# Patient Record
Sex: Male | Born: 1935 | Race: White | Hispanic: No | Marital: Married | Smoking: Never smoker
Health system: Southern US, Community
[De-identification: ages and names within clinical notes are randomized; demographics above are authoritative.]

## PROBLEM LIST (undated history)

## (undated) DIAGNOSIS — L570 Actinic keratosis: Secondary | ICD-10-CM

## (undated) DIAGNOSIS — J449 Chronic obstructive pulmonary disease, unspecified: Secondary | ICD-10-CM

## (undated) DIAGNOSIS — I10 Essential (primary) hypertension: Secondary | ICD-10-CM

## (undated) DIAGNOSIS — Z950 Presence of cardiac pacemaker: Secondary | ICD-10-CM

## (undated) DIAGNOSIS — I219 Acute myocardial infarction, unspecified: Secondary | ICD-10-CM

## (undated) DIAGNOSIS — Z8739 Personal history of other diseases of the musculoskeletal system and connective tissue: Secondary | ICD-10-CM

## (undated) DIAGNOSIS — H9190 Unspecified hearing loss, unspecified ear: Secondary | ICD-10-CM

## (undated) DIAGNOSIS — I2510 Atherosclerotic heart disease of native coronary artery without angina pectoris: Secondary | ICD-10-CM

## (undated) DIAGNOSIS — E785 Hyperlipidemia, unspecified: Secondary | ICD-10-CM

## (undated) DIAGNOSIS — N400 Enlarged prostate without lower urinary tract symptoms: Secondary | ICD-10-CM

## (undated) DIAGNOSIS — F419 Anxiety disorder, unspecified: Secondary | ICD-10-CM

## (undated) DIAGNOSIS — I499 Cardiac arrhythmia, unspecified: Secondary | ICD-10-CM

## (undated) DIAGNOSIS — G4730 Sleep apnea, unspecified: Secondary | ICD-10-CM

## (undated) DIAGNOSIS — E039 Hypothyroidism, unspecified: Secondary | ICD-10-CM

## (undated) DIAGNOSIS — K219 Gastro-esophageal reflux disease without esophagitis: Secondary | ICD-10-CM

## (undated) DIAGNOSIS — J479 Bronchiectasis, uncomplicated: Secondary | ICD-10-CM

## (undated) DIAGNOSIS — J45909 Unspecified asthma, uncomplicated: Secondary | ICD-10-CM

## (undated) HISTORY — PX: APPENDECTOMY: SHX54

## (undated) HISTORY — PX: CORONARY ANGIOPLASTY: SHX604

## (undated) HISTORY — PX: INSERT / REPLACE / REMOVE PACEMAKER: SUR710

## (undated) HISTORY — DX: Actinic keratosis: L57.0

---

## 1983-02-25 DIAGNOSIS — I219 Acute myocardial infarction, unspecified: Secondary | ICD-10-CM

## 1983-02-25 HISTORY — DX: Acute myocardial infarction, unspecified: I21.9

## 1983-02-25 HISTORY — PX: CORONARY ARTERY BYPASS GRAFT: SHX141

## 2005-01-14 ENCOUNTER — Other Ambulatory Visit: Payer: Self-pay

## 2005-01-14 ENCOUNTER — Ambulatory Visit: Payer: Self-pay | Admitting: Urology

## 2005-01-22 ENCOUNTER — Ambulatory Visit: Payer: Self-pay | Admitting: Urology

## 2007-12-09 ENCOUNTER — Ambulatory Visit: Payer: Self-pay | Admitting: Unknown Physician Specialty

## 2012-02-02 ENCOUNTER — Ambulatory Visit: Payer: Self-pay | Admitting: Unknown Physician Specialty

## 2012-02-23 DIAGNOSIS — R351 Nocturia: Secondary | ICD-10-CM | POA: Insufficient documentation

## 2012-02-23 DIAGNOSIS — N401 Enlarged prostate with lower urinary tract symptoms: Secondary | ICD-10-CM | POA: Insufficient documentation

## 2013-01-27 DIAGNOSIS — C4492 Squamous cell carcinoma of skin, unspecified: Secondary | ICD-10-CM

## 2013-01-27 HISTORY — DX: Squamous cell carcinoma of skin, unspecified: C44.92

## 2013-04-27 DIAGNOSIS — C44622 Squamous cell carcinoma of skin of right upper limb, including shoulder: Secondary | ICD-10-CM

## 2013-04-27 HISTORY — DX: Squamous cell carcinoma of skin of right upper limb, including shoulder: C44.622

## 2013-08-11 DIAGNOSIS — G4730 Sleep apnea, unspecified: Secondary | ICD-10-CM | POA: Insufficient documentation

## 2013-08-11 DIAGNOSIS — J45909 Unspecified asthma, uncomplicated: Secondary | ICD-10-CM | POA: Insufficient documentation

## 2014-03-07 DIAGNOSIS — I2510 Atherosclerotic heart disease of native coronary artery without angina pectoris: Secondary | ICD-10-CM | POA: Insufficient documentation

## 2014-03-07 DIAGNOSIS — G4730 Sleep apnea, unspecified: Secondary | ICD-10-CM

## 2014-03-07 DIAGNOSIS — E785 Hyperlipidemia, unspecified: Secondary | ICD-10-CM | POA: Insufficient documentation

## 2014-03-07 DIAGNOSIS — G4710 Hypersomnia, unspecified: Secondary | ICD-10-CM | POA: Insufficient documentation

## 2014-03-07 DIAGNOSIS — N400 Enlarged prostate without lower urinary tract symptoms: Secondary | ICD-10-CM | POA: Insufficient documentation

## 2014-03-07 DIAGNOSIS — I25810 Atherosclerosis of coronary artery bypass graft(s) without angina pectoris: Secondary | ICD-10-CM | POA: Insufficient documentation

## 2014-03-07 DIAGNOSIS — I10 Essential (primary) hypertension: Secondary | ICD-10-CM | POA: Insufficient documentation

## 2014-03-07 DIAGNOSIS — J449 Chronic obstructive pulmonary disease, unspecified: Secondary | ICD-10-CM | POA: Insufficient documentation

## 2014-06-01 DIAGNOSIS — C44622 Squamous cell carcinoma of skin of right upper limb, including shoulder: Secondary | ICD-10-CM

## 2014-06-01 HISTORY — DX: Squamous cell carcinoma of skin of right upper limb, including shoulder: C44.622

## 2014-06-06 DIAGNOSIS — E782 Mixed hyperlipidemia: Secondary | ICD-10-CM | POA: Insufficient documentation

## 2015-01-16 DIAGNOSIS — C44722 Squamous cell carcinoma of skin of right lower limb, including hip: Secondary | ICD-10-CM

## 2015-01-16 DIAGNOSIS — C44629 Squamous cell carcinoma of skin of left upper limb, including shoulder: Secondary | ICD-10-CM

## 2015-01-16 HISTORY — DX: Squamous cell carcinoma of skin of right lower limb, including hip: C44.722

## 2015-01-16 HISTORY — DX: Squamous cell carcinoma of skin of left upper limb, including shoulder: C44.629

## 2015-02-25 DIAGNOSIS — N400 Enlarged prostate without lower urinary tract symptoms: Secondary | ICD-10-CM

## 2015-02-25 HISTORY — DX: Benign prostatic hyperplasia without lower urinary tract symptoms: N40.0

## 2015-04-16 DIAGNOSIS — I493 Ventricular premature depolarization: Secondary | ICD-10-CM | POA: Insufficient documentation

## 2015-04-19 ENCOUNTER — Ambulatory Visit
Admission: RE | Admit: 2015-04-19 | Discharge: 2015-04-19 | Disposition: A | Discharge status: Home / Self Care | Payer: Medicare Other | Source: Ambulatory Visit | Attending: Internal Medicine | Admitting: Internal Medicine

## 2015-04-19 ENCOUNTER — Other Ambulatory Visit: Payer: Self-pay | Admitting: Internal Medicine

## 2015-04-19 DIAGNOSIS — R0609 Other forms of dyspnea: Secondary | ICD-10-CM | POA: Diagnosis present

## 2015-04-19 DIAGNOSIS — R918 Other nonspecific abnormal finding of lung field: Secondary | ICD-10-CM | POA: Insufficient documentation

## 2015-04-19 DIAGNOSIS — R609 Edema, unspecified: Secondary | ICD-10-CM | POA: Insufficient documentation

## 2015-04-19 DIAGNOSIS — I2510 Atherosclerotic heart disease of native coronary artery without angina pectoris: Secondary | ICD-10-CM | POA: Diagnosis not present

## 2015-04-19 DIAGNOSIS — Z951 Presence of aortocoronary bypass graft: Secondary | ICD-10-CM | POA: Diagnosis not present

## 2015-04-19 LAB — POCT I-STAT CREATININE: CREATININE: 1.3 mg/dL — AB (ref 0.61–1.24)

## 2015-04-19 MED ORDER — IOHEXOL 350 MG/ML SOLN
75.0000 mL | Freq: Once | INTRAVENOUS | Status: AC | PRN
  Administered 2015-04-19: 75 mL via INTRAVENOUS

## 2015-06-07 DIAGNOSIS — J479 Bronchiectasis, uncomplicated: Secondary | ICD-10-CM | POA: Insufficient documentation

## 2015-11-12 ENCOUNTER — Other Ambulatory Visit: Payer: Self-pay

## 2015-11-12 ENCOUNTER — Encounter
Admission: RE | Admit: 2015-11-12 | Discharge: 2015-11-12 | Disposition: A | Discharge status: Home / Self Care | Payer: Medicare Other | Source: Ambulatory Visit | Attending: Surgery | Admitting: Surgery

## 2015-11-12 DIAGNOSIS — Z951 Presence of aortocoronary bypass graft: Secondary | ICD-10-CM | POA: Insufficient documentation

## 2015-11-12 DIAGNOSIS — I10 Essential (primary) hypertension: Secondary | ICD-10-CM | POA: Diagnosis not present

## 2015-11-12 HISTORY — DX: Gastro-esophageal reflux disease without esophagitis: K21.9

## 2015-11-12 HISTORY — DX: Sleep apnea, unspecified: G47.30

## 2015-11-12 HISTORY — DX: Benign prostatic hyperplasia without lower urinary tract symptoms: N40.0

## 2015-11-12 HISTORY — DX: Atherosclerotic heart disease of native coronary artery without angina pectoris: I25.10

## 2015-11-12 HISTORY — DX: Cardiac arrhythmia, unspecified: I49.9

## 2015-11-12 HISTORY — DX: Essential (primary) hypertension: I10

## 2015-11-12 HISTORY — DX: Hyperlipidemia, unspecified: E78.5

## 2015-11-12 HISTORY — DX: Unspecified asthma, uncomplicated: J45.909

## 2015-11-12 HISTORY — DX: Bronchiectasis, uncomplicated: J47.9

## 2015-11-12 HISTORY — DX: Acute myocardial infarction, unspecified: I21.9

## 2015-11-12 HISTORY — DX: Chronic obstructive pulmonary disease, unspecified: J44.9

## 2015-11-12 LAB — CBC WITH DIFFERENTIAL/PLATELET
Basophils Absolute: 0 10*3/uL (ref 0–0.1)
Basophils Relative: 0 %
Eosinophils Absolute: 0.4 10*3/uL (ref 0–0.7)
Eosinophils Relative: 4 %
HCT: 49.8 % (ref 40.0–52.0)
Hemoglobin: 16.6 g/dL (ref 13.0–18.0)
Lymphocytes Relative: 29 %
Lymphs Abs: 2.8 10*3/uL (ref 1.0–3.6)
MCH: 30.1 pg (ref 26.0–34.0)
MCHC: 33.2 g/dL (ref 32.0–36.0)
MCV: 90.4 fL (ref 80.0–100.0)
Monocytes Absolute: 0.7 10*3/uL (ref 0.2–1.0)
Monocytes Relative: 7 %
Neutro Abs: 5.8 10*3/uL (ref 1.4–6.5)
Neutrophils Relative %: 60 %
Platelets: 180 10*3/uL (ref 150–440)
RBC: 5.51 MIL/uL (ref 4.40–5.90)
RDW: 14.1 % (ref 11.5–14.5)
WBC: 9.7 10*3/uL (ref 3.8–10.6)

## 2015-11-12 LAB — BASIC METABOLIC PANEL
ANION GAP: 8 (ref 5–15)
BUN: 17 mg/dL (ref 6–20)
CALCIUM: 9.8 mg/dL (ref 8.9–10.3)
CHLORIDE: 105 mmol/L (ref 101–111)
CO2: 29 mmol/L (ref 22–32)
Creatinine, Ser: 1.07 mg/dL (ref 0.61–1.24)
GFR calc non Af Amer: >60 mL/min (ref >60)
Glucose, Bld: 102 mg/dL — ABNORMAL HIGH (ref 65–99)
Potassium: 4.1 mmol/L (ref 3.5–5.1)
Sodium: 142 mmol/L (ref 135–145)

## 2015-11-12 NOTE — Patient Instructions (Signed)
Your procedure is scheduled on: November 21, 2015 Su procedimiento est programado para: Report to  [facility] a: To find out your arrival time please call [phone] between 1PM - 3PM on November 20, 2015 Para saber su hora de llegada por favor llame al ([place]  Remember: Instructions that are not followed completely may result in serious medical risk, up to and including death, or upon the discretion of your surgeon and anesthesiologist your surgery may need to be rescheduled.  Recuerde: Las instrucciones que no se siguen completamente Heritage manager en un riesgo de salud grave, incluyendo hasta la [place] o a discrecin de su cirujano y Environmental health practitioner, su ciruga se puede posponer.   _x___ 1. Do not eat food or drink liquids after midnight. No gum chewing or hard candies.  No coma alimentos ni tome lquidos despus de la medianoche.  No mastique chicle ni caramelos  duros.     __x_ 2. No alcohol for 24 hours before or after surgery.    No tome alcohol durante las 24 horas antes ni despus de la [place].   ____ 3. Bring all medications with you on the day of surgery if instructed.    Lleve todos los medicamentos con usted el da de su ciruga si se le ha indicado as.   _x__ 4. Notify your doctor if there is any change in your medical condition (cold, fever,                             infections).    Informe a su mdico si hay algn cambio en su condicin mdica (resfriado, fiebre, infecciones).   Do not wear jewelry, make-up, hairpins, clips or nail polish.  No use joyas, maquillajes, pinzas/ganchos para el cabello ni esmalte de uas.  Do not wear lotions, powders, or perfumes. You may wear deodorant.  No use lociones, polvos o perfumes.  Puede usar desodorante.    Do not shave 48 hours prior to surgery. Men may shave face and neck.  No se afeite 48 horas antes de la [place].  Los hombres pueden  [employer] cara y el cuello.   Do not bring valuables to the hospital.   No lleve objetos [facility] is not responsible for any belongings or valuables.  [facility] no se hace responsable de ningn tipo de pertenencias u objetos de Geographical information systems officer.               Contacts, dentures or bridgework may not be worn into surgery.  Los lentes de [place], las dentaduras postizas o puentes no se pueden usar en la [place].  Leave your suitcase in the car. After surgery it may be brought to your room.  Deje su maleta en el auto.  Despus de la ciruga podr traerla a su habitacin.  For patients admitted to the hospital, discharge time is determined by your treatment team.  Para los pacientes que sean ingresados al hospital, el tiempo en el cual se le dar de alta es determinado por su                equipo de [place].   Patients discharged the day of surgery will not be allowed to drive home. A los pacientes que se les da de alta el mismo da de la ciruga no se les permitir conducir a Holiday representative.  Please read over the following fact sheets that you were given: Por favor [place] informacin que le dieron:      ___X_ Take these medicines the morning of surgery with A SIP OF WATER:          [employer] medicinas la maana de la ciruga con UN SORBO DE AGUA:  1.  Amlodipine              2.  Atorvastatin  3.  Pulmicort inhaler  4.  Isosorbide     5.  Singulair and nasal spray  6.  Prilosec ____ Fleet Enema (as directed)          Enema de Fleet (segn lo indicado)    __X__ Use CHG Soap as directed          Utilice el jabn de CHG segn lo indicado  __X__ Use inhalers on the day of surgery and [place]          Use los inhaladores el da de la ciruga  ____ Stop metformin 2 days prior to surgery          Deje de tomar el metformin 2 das antes de la ciruga    ____ Take 1/2 of usual insulin dose the night before  surgery and none on the morning of surgery           Tome la mitad de la dosis habitual de insulina la noche antes de la [place] y no tome nada en la maana de la             ciruga  _ X___ Stop Coumadin/Plavix/aspirin on 11/14/15          Deje de tomar el Coumadin/Plavix/aspirina el da:  __X__ Stop Anti-inflammatories on 11/14/15   ... SUCH AS IBUPROFEN, MOTRIN, ALEVE          Deje de tomar antiinflamatorios el da:   __X__ Stop supplements until after surgery            Deje de tomar suplementos hasta despus de la ciruga  ____ Bring C-Pap to the hospital          [facility]

## 2015-11-12 NOTE — Pre-Procedure Instructions (Signed)
Formatting of this note may be different from the original.  Follow-up  History of Present Illness: Joshua Frye is a 80 y.o. male presents to clinic for recheck. His asthma is stable, he has not used his albuterol in months. He does not smoke. He is wearing his cpap nightly, continued positive response. No chest pain, leg swelling, wheezing, cough, ectopy or syncope.   Current Medications:  Current Outpatient Prescriptions  Medication Sig Dispense Refill  . albuterol 90 mcg/actuation inhaler Inhale 2 inhalations into the lungs every 6 (six) hours as needed.  [name] amLODIPine (NORVASC) 10 MG tablet Take 1 tablet (10 mg total) by mouth once daily. 90 tablet 3  . ascorbic acid (VITAMIN C) 1000 MG tablet Take 1,000 mg by mouth.  [name] aspirin (ASPIRIN LOW DOSE) 81 MG EC tablet Take 81 mg by mouth once daily.  [name] atorvastatin (LIPITOR) 20 MG tablet Take 1 tablet (20 mg total) by mouth once daily. 90 tablet 3  . budesonide (PULMICORT) 180 mcg/actuation inhaler Inhale 1 inhalation into the lungs once daily.  . fluticasone (FLONASE) 50 mcg/actuation nasal spray Place 2 sprays into both nostrils once daily. 48 g 3  . isosorbide mononitrate (IMDUR) 30 MG ER tablet Take 1 tablet (30 mg total) by mouth once daily. 90 tablet 3  . montelukast (SINGULAIR) 10 mg tablet Take 10 mg by mouth nightly.  . multivitamin tablet Take 1 tablet by mouth once daily.  . nitroGLYcerin (NITROSTAT) 0.4 MG SL tablet 1 tablet under tongue as needed [1 tab under tongue every 5 mins for chest pain, if 3rd tab needed take then call 911.] 25 tablet 2  . omeprazole 20 mg Take 1 tablet by mouth once daily. 90 each 3  . tamsulosin (FLOMAX) 0.4 mg capsule Take 0.4 mg by mouth.   No current facility-administered medications for this visit.   Problem List:  Patient Active Problem List  Diagnosis  . Asthma  . Sleep apnea with use of continuous positive airway pressure (CPAP)  . Essential hypertension, benign  . BPH (benign prostatic  hyperplasia)  . Coronary atherosclerosis of autologous vein bypass graft  . Coronary atherosclerosis of native coronary artery  . Hyperlipidemia, mixed   History: Past Medical History  Diagnosis Date  . BPH (benign prostatic hyperplasia)  . Chronic obstructive asthma, unspecified  . 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 Cardiac cath 08/22/03.  . Coronary atherosclerosis of native coronary artery  . Essential hypertension, benign  . Other and unspecified hyperlipidemia  . Sleep apnea  on CPAP   Past Surgical History  Procedure Laterality Date  . Cardiac catheterization 08/22/03  . Appendectomy  . Other surgery 1999  Hernia repair by Dr. Pat Patrick  . Coronary artery bypass graft 1987  x 3 vessels   Family History  Problem Relation Age of Onset  . Throat cancer Father  deceased age 68, throat cancer  . Heart disease Mother   Social History   Social History  . Marital status: Married  Spouse name: N/A  . Number of children: N/A  . Years of education: N/A   Social History Main Topics  . Smoking status: Never Smoker  . Smokeless tobacco: Never Used  Comment: No smoking  . Alcohol use No  . Drug use: No  . Sexual activity: Defer   Other Topics Concern  . None   Social History Narrative   Allergies:  Procardia [nifedipine]  Review of Systems: As per above. Pretty much  unchanged with the exception that his breathing is stable. No other associated cardiopulmonary, GI, GU, dermatological symptoms today. No focal neurological symptoms or psychological changes. The rest of the ros is un changed.   Physical Exam: Visit Vitals  . BP 142/73  . Pulse 62  . Temp 36.6 C (97.9 F) (Oral)  . Wt 86.6 kg (191 lb)  . SpO2 96%  . BMI 28.21 kg/m2  86.6 kg (191 lb) 96% General: NAD. Able to speak in complete sentences without cough or dyspnea HEENT: Normocephalic, nontraumatic. Extraocular movements intact NECK: Supple. No JVD, nodes,  thyromegaly CV: RRR no murmurs, gallops, rubs PULM: Normal respiratory effort, Clear to auscultation bilaterally without wheezing or crackles EXTREMITIES: No significant edema, cyanosis or [place]'signs SKIN: Fair turgor. No rashes LYMPHATIC: No nodes NEURO: No gross deficits PSYCH: Appropriate affect, alert, oriented   Impression:  Asthma, mild intermittent, stable   .  Sleep apnea with use of continuous positive airway pressure (CPAP)      Plan: -continue the above medications -continue cpap on same settings -follow up in 7-8 months

## 2015-11-14 NOTE — Pre-Procedure Instructions (Signed)
FAXED EKG TO DR Ubaldo Glassing TO REVIEW. HE SAW PATIENT 10/15/15. HAD STRESS/ECHO 1/17

## 2015-11-19 NOTE — Pre-Procedure Instructions (Signed)
CLEARED BY DR Ubaldo Glassing 11/16/15

## 2015-11-20 ENCOUNTER — Ambulatory Visit: Payer: Medicare Other | Admitting: Anesthesiology

## 2015-11-20 ENCOUNTER — Ambulatory Visit
Admission: RE | Admit: 2015-11-20 | Discharge: 2015-11-20 | Disposition: A | Discharge status: Home / Self Care | Payer: Medicare Other | Source: Ambulatory Visit | Attending: Surgery | Admitting: Surgery

## 2015-11-20 ENCOUNTER — Encounter
Admission: RE | Disposition: A | Discharge status: Home / Self Care | Payer: Self-pay | Source: Ambulatory Visit | Attending: Surgery

## 2015-11-20 ENCOUNTER — Telehealth: Payer: Self-pay | Admitting: General Surgery

## 2015-11-20 ENCOUNTER — Encounter: Payer: Self-pay | Admitting: *Deleted

## 2015-11-20 DIAGNOSIS — I10 Essential (primary) hypertension: Secondary | ICD-10-CM | POA: Diagnosis not present

## 2015-11-20 DIAGNOSIS — E785 Hyperlipidemia, unspecified: Secondary | ICD-10-CM | POA: Insufficient documentation

## 2015-11-20 DIAGNOSIS — Z951 Presence of aortocoronary bypass graft: Secondary | ICD-10-CM | POA: Insufficient documentation

## 2015-11-20 DIAGNOSIS — I2510 Atherosclerotic heart disease of native coronary artery without angina pectoris: Secondary | ICD-10-CM | POA: Diagnosis not present

## 2015-11-20 DIAGNOSIS — Z7982 Long term (current) use of aspirin: Secondary | ICD-10-CM | POA: Insufficient documentation

## 2015-11-20 DIAGNOSIS — Z792 Long term (current) use of antibiotics: Secondary | ICD-10-CM | POA: Diagnosis not present

## 2015-11-20 DIAGNOSIS — Z7951 Long term (current) use of inhaled steroids: Secondary | ICD-10-CM | POA: Insufficient documentation

## 2015-11-20 DIAGNOSIS — N400 Enlarged prostate without lower urinary tract symptoms: Secondary | ICD-10-CM | POA: Diagnosis not present

## 2015-11-20 DIAGNOSIS — Z79899 Other long term (current) drug therapy: Secondary | ICD-10-CM | POA: Diagnosis not present

## 2015-11-20 DIAGNOSIS — K4090 Unilateral inguinal hernia, without obstruction or gangrene, not specified as recurrent: Secondary | ICD-10-CM | POA: Insufficient documentation

## 2015-11-20 DIAGNOSIS — J449 Chronic obstructive pulmonary disease, unspecified: Secondary | ICD-10-CM | POA: Diagnosis not present

## 2015-11-20 DIAGNOSIS — G4730 Sleep apnea, unspecified: Secondary | ICD-10-CM | POA: Insufficient documentation

## 2015-11-20 HISTORY — PX: INGUINAL HERNIA REPAIR: SHX194

## 2015-11-20 SURGERY — REPAIR, HERNIA, INGUINAL, ADULT
Anesthesia: General | Laterality: Right | Wound class: Clean

## 2015-11-20 MED ORDER — ONDANSETRON HCL 4 MG/2ML IJ SOLN
INTRAMUSCULAR | Status: DC | PRN
  Administered 2015-11-20: 4 mg via INTRAVENOUS

## 2015-11-20 MED ORDER — ROCURONIUM BROMIDE 100 MG/10ML IV SOLN
INTRAVENOUS | Status: DC | PRN
  Administered 2015-11-20: 10 mg via INTRAVENOUS
  Administered 2015-11-20: 5 mg via INTRAVENOUS
  Administered 2015-11-20: 40 mg via INTRAVENOUS

## 2015-11-20 MED ORDER — HYDROCODONE-ACETAMINOPHEN 5-325 MG PO TABS: 1.0000 | ORAL_TABLET | ORAL | 0 refills | Status: DC | PRN

## 2015-11-20 MED ORDER — CEFAZOLIN SODIUM-DEXTROSE 2-4 GM/100ML-% IV SOLN
INTRAVENOUS | Status: AC
  Administered 2015-11-20: 2 g via INTRAVENOUS
  Filled 2015-11-20: qty 100

## 2015-11-20 MED ORDER — FENTANYL CITRATE (PF) 100 MCG/2ML IJ SOLN
INTRAMUSCULAR | Status: AC
  Administered 2015-11-20: 25 ug via INTRAVENOUS
  Filled 2015-11-20: qty 2

## 2015-11-20 MED ORDER — BUPIVACAINE-EPINEPHRINE (PF) 0.5% -1:200000 IJ SOLN
INTRAMUSCULAR | Status: DC | PRN
  Administered 2015-11-20: 23 mL

## 2015-11-20 MED ORDER — LIDOCAINE HCL (CARDIAC) 20 MG/ML IV SOLN
INTRAVENOUS | Status: DC | PRN
  Administered 2015-11-20: 100 mg via INTRAVENOUS

## 2015-11-20 MED ORDER — FENTANYL CITRATE (PF) 100 MCG/2ML IJ SOLN
INTRAMUSCULAR | Status: DC | PRN
  Administered 2015-11-20: 100 ug via INTRAVENOUS

## 2015-11-20 MED ORDER — ONDANSETRON HCL 4 MG/2ML IJ SOLN: 4.0000 mg | Freq: Once | INTRAMUSCULAR | Status: DC | PRN

## 2015-11-20 MED ORDER — ACETAMINOPHEN 10 MG/ML IV SOLN
INTRAVENOUS | Status: DC | PRN
  Administered 2015-11-20: 1000 mg via INTRAVENOUS

## 2015-11-20 MED ORDER — NEOSTIGMINE METHYLSULFATE 10 MG/10ML IV SOLN
INTRAVENOUS | Status: DC | PRN
  Administered 2015-11-20: 4 mg via INTRAVENOUS

## 2015-11-20 MED ORDER — GLYCOPYRROLATE 0.2 MG/ML IJ SOLN
INTRAMUSCULAR | Status: DC | PRN
  Administered 2015-11-20: .8 mg via INTRAVENOUS
  Administered 2015-11-20: 0.2 mg via INTRAVENOUS

## 2015-11-20 MED ORDER — EPHEDRINE SULFATE 50 MG/ML IJ SOLN
INTRAMUSCULAR | Status: DC | PRN
  Administered 2015-11-20 (×4): 5 mg via INTRAVENOUS

## 2015-11-20 MED ORDER — CEFAZOLIN SODIUM-DEXTROSE 2-4 GM/100ML-% IV SOLN
2.0000 g | Freq: Once | INTRAVENOUS | Status: AC
  Administered 2015-11-20: 2 g via INTRAVENOUS

## 2015-11-20 MED ORDER — HYDROCODONE-ACETAMINOPHEN 5-325 MG PO TABS: 1.0000 | ORAL_TABLET | ORAL | Status: DC | PRN

## 2015-11-20 MED ORDER — PROPOFOL 10 MG/ML IV BOLUS
INTRAVENOUS | Status: DC | PRN
  Administered 2015-11-20: 120 mg via INTRAVENOUS

## 2015-11-20 MED ORDER — LACTATED RINGERS IV SOLN
INTRAVENOUS | Status: DC
  Administered 2015-11-20: 07:00:00 via INTRAVENOUS

## 2015-11-20 MED ORDER — FENTANYL CITRATE (PF) 100 MCG/2ML IJ SOLN
25.0000 ug | INTRAMUSCULAR | Status: DC | PRN
Start: 2015-11-20 — End: 2015-11-20
  Administered 2015-11-20 (×4): 25 ug via INTRAVENOUS

## 2015-11-20 MED ORDER — ACETAMINOPHEN 10 MG/ML IV SOLN
INTRAVENOUS | Status: AC
  Filled 2015-11-20: qty 100

## 2015-11-20 MED ORDER — BUPIVACAINE-EPINEPHRINE (PF) 0.5% -1:200000 IJ SOLN
INTRAMUSCULAR | Status: AC
  Filled 2015-11-20: qty 30

## 2015-11-20 SURGICAL SUPPLY — 25 items
BLADE SURG 15 STRL LF DISP TIS (BLADE) ×1 IMPLANT
BLADE SURG 15 STRL SS (BLADE) ×1
CANISTER SUCT 1200ML W/VALVE (MISCELLANEOUS) ×2 IMPLANT
CHLORAPREP W/TINT 26ML (MISCELLANEOUS) ×2 IMPLANT
DRAIN PENROSE 5/8X18 LTX STRL (WOUND CARE) ×2 IMPLANT
DRAPE LAPAROTOMY 77X122 PED (DRAPES) ×2 IMPLANT
ELECT REM PT RETURN 9FT ADLT (ELECTROSURGICAL) ×2
ELECTRODE REM PT RTRN 9FT ADLT (ELECTROSURGICAL) ×1 IMPLANT
GLOVE BIO SURGEON STRL SZ7.5 (GLOVE) ×14 IMPLANT
GOWN STRL REUS W/ TWL LRG LVL3 (GOWN DISPOSABLE) ×3 IMPLANT
GOWN STRL REUS W/TWL LRG LVL3 (GOWN DISPOSABLE) ×3
KIT RM TURNOVER STRD PROC AR (KITS) ×2 IMPLANT
LABEL OR SOLS (LABEL) ×2 IMPLANT
LIQUID BAND (GAUZE/BANDAGES/DRESSINGS) ×2 IMPLANT
MESH SYNTHETIC 4X6 SOFT BARD (Mesh General) ×1 IMPLANT
MESH SYNTHETIC SOFT BARD 4X6 (Mesh General) ×1 IMPLANT
NEEDLE HYPO 25X1 1.5 SAFETY (NEEDLE) ×2 IMPLANT
NS IRRIG 500ML POUR BTL (IV SOLUTION) ×2 IMPLANT
PACK BASIN MINOR ARMC (MISCELLANEOUS) ×2 IMPLANT
SUT CHROMIC 4 0 RB 1X27 (SUTURE) ×2 IMPLANT
SUT MNCRL AB 4-0 PS2 18 (SUTURE) ×2 IMPLANT
SUT SURGILON 0 30 BLK (SUTURE) ×4 IMPLANT
SUT VIC AB 4-0 SH 27 (SUTURE) ×1
SUT VIC AB 4-0 SH 27XANBCTRL (SUTURE) ×1 IMPLANT
SYRINGE 10CC LL (SYRINGE) ×2 IMPLANT

## 2015-11-20 NOTE — Anesthesia Postprocedure Evaluation (Signed)
Anesthesia Post Note  Patient: Joshua Frye  Procedure(s) Performed: Procedure(s) (LRB): HERNIA REPAIR INGUINAL ADULT (Right)  Patient location during evaluation: PACU Anesthesia Type: General Level of consciousness: awake and alert Pain management: pain level controlled Vital Signs Assessment: post-procedure vital signs reviewed and stable Respiratory status: spontaneous breathing, nonlabored ventilation, respiratory function stable and patient connected to nasal cannula oxygen Cardiovascular status: blood pressure returned to baseline and stable Postop Assessment: no signs of nausea or vomiting Anesthetic complications: no    Last Vitals:  Vitals:   11/20/15 0942 11/20/15 0948  BP: 135/66 (!) 103/57  Pulse: (!) 52   Resp: 16   Temp:      Last Pain:  Vitals:   11/20/15 0936  TempSrc:   PainSc: 3                  Molli Barrows

## 2015-11-20 NOTE — Transfer of Care (Signed)
Immediate Anesthesia Transfer of Care Note  Patient: Joshua Frye  Procedure(s) Performed: Procedure(s): HERNIA REPAIR INGUINAL ADULT (Right)  Patient Location: PACU  Anesthesia Type:General  Level of Consciousness: awake, alert , oriented and patient cooperative  Airway & Oxygen Therapy: Patient Spontanous Breathing and Patient connected to face mask oxygen  Post-op Assessment: Report given to RN, Post -op Vital signs reviewed and stable and Patient moving all extremities X 4  Post vital signs: Reviewed and stable  Last Vitals:  Vitals:   11/20/15 0613 11/20/15 0855  BP: 132/66 137/61  Pulse: (!) 41 65  Resp: 16 19  Temp: (!) 35.9 C 36.9 C    Last Pain:  Vitals:   11/20/15 0613  TempSrc: Tympanic  PainSc: 2          Complications: No apparent anesthesia complications

## 2015-11-20 NOTE — Anesthesia Preprocedure Evaluation (Signed)
Anesthesia Evaluation  Patient identified by MRN, date of birth, ID band Patient awake    Reviewed: Allergy & Precautions, H&P , NPO status , Patient's Chart, lab work & pertinent test results, reviewed documented beta blocker date and time   Airway Mallampati: III  TM Distance: >3 FB Neck ROM: full    Dental  (+) Teeth Intact   Pulmonary neg pulmonary ROS, neg shortness of breath, asthma , sleep apnea and Continuous Positive Airway Pressure Ventilation , COPD,    Pulmonary exam normal        Cardiovascular hypertension, + CAD and + Past MI  negative cardio ROS Normal cardiovascular exam+ dysrhythmias Ventricular Tachycardia  Rhythm:regular Rate:Normal  Good exercise tolerance sp Fath eval and clearance.  Echo reveals good ef and no ischemia.JA   Neuro/Psych negative neurological ROS  negative psych ROS   GI/Hepatic negative GI ROS, Neg liver ROS, GERD  Medicated,  Endo/Other  negative endocrine ROS  Renal/GU negative Renal ROS  negative genitourinary   Musculoskeletal   Abdominal   Peds  Hematology negative hematology ROS (+)   Anesthesia Other Findings Past Medical History: No date: Asthma 2017: BPH (benign prostatic hypertrophy) No date: Bronchiectasis ([place]) No date: COPD (chronic obstructive pulmonary disease) (* No date: Coronary artery disease No date: Dysrhythmia     Comment: asymptomatic pvcs No date: GERD (gastroesophageal reflux disease) No date: Hyperlipidemia No date: Hypertension 1985: Myocardial infarction ([place]) No date: Sleep apnea Past Surgical History: No date: APPENDECTOMY No date: CORONARY ANGIOPLASTY     Comment: x 2 1985: CORONARY ARTERY BYPASS GRAFT BMI    Body Mass Index:  28.06 kg/m     Reproductive/Obstetrics negative OB ROS                             Anesthesia Physical Anesthesia Plan  ASA: III  Anesthesia Plan: General ETT   Post-op Pain  Management:    Induction:   Airway Management Planned:   Additional Equipment:   Intra-op Plan:   Post-operative Plan:   Informed Consent: I have reviewed the patients History and Physical, chart, labs and discussed the procedure including the risks, benefits and alternatives for the proposed anesthesia with the patient or authorized representative who has indicated his/her understanding and acceptance.   Dental Advisory Given  Plan Discussed with: CRNA  Anesthesia Plan Comments:         Anesthesia Quick Evaluation

## 2015-11-20 NOTE — Telephone Encounter (Signed)
Right inguinal hernia repair earlier today. Voided well on return home,since then has voided small amounts with sense of incomplete emptying. Options reviewed: Oral analgesics, relaxation with hopes voiding improves vs: ED for catheter.

## 2015-11-20 NOTE — Anesthesia Procedure Notes (Signed)
Procedure Name: Intubation Performed by: Previn Jian Pre-anesthesia Checklist: Patient identified, Patient being monitored, Timeout performed, Emergency Drugs available and Suction available Patient Re-evaluated:Patient Re-evaluated prior to inductionOxygen Delivery Method: Circle system utilized Preoxygenation: Pre-oxygenation with 100% oxygen Intubation Type: IV induction Ventilation: Mask ventilation without difficulty Laryngoscope Size: Mac and 3 Grade View: Grade I Tube type: Oral Tube size: 7.5 mm Number of attempts: 1 Airway Equipment and Method: Stylet Placement Confirmation: ETT inserted through vocal cords under direct vision,  positive ETCO2 and breath sounds checked- equal and bilateral Secured at: 21 cm Tube secured with: Tape Dental Injury: Teeth and Oropharynx as per pre-operative assessment        

## 2015-11-20 NOTE — Discharge Instructions (Addendum)
Take Tylenol or Norco if needed for pain.  Should not drive or do anything dangerous when taking Norco.  Resume aspirin on Thursday.  May shower and blot dry.  Avoid straining and heavy lifting.    AMBULATORY SURGERY  DISCHARGE INSTRUCTIONS   1) The drugs that you were given will stay in your system until tomorrow so for the next 24 hours you should not:  A) Drive an automobile B) Make any legal decisions C) Drink any alcoholic beverage   2) You may resume regular meals tomorrow.  Today it is better to start with liquids and gradually work up to solid foods.  You may eat anything you prefer, but it is better to start with liquids, then soup and crackers, and gradually work up to solid foods.   3) Please notify your doctor immediately if you have any unusual bleeding, trouble breathing, redness and pain at the surgery site, drainage, fever, or pain not relieved by medication.    4) Additional Instructions:        Please contact your physician with any problems or Same Day Surgery at [phone], Monday through Friday 6 am to 4 pm, or [place] at [facility] number at [phone].

## 2015-11-20 NOTE — Op Note (Signed)
OPERATIVE REPORT  PREOPERATIVE DIAGNOSIS: right inguinal hernia  POSTOPERATIVE DIAGNOSIS:right  inguinal hernia  PROCEDURE:  right inguinal hernia repair  ANESTHESIA:  General  SURGEON:  Rochel Brome M.D.  INDICATIONS: He reports recent right groin pain and did have physical findings of a right inguinal hernia.  With the patient on the operating table in the supine position the right lower quadrant was prepared with clippers and with ChloraPrep and draped in a sterile manner. A transversely oriented suprapubic incision was made and carried down through subcutaneous tissues. Electrocautery was used for hemostasis. The Scarpa's fascia was incised. The external oblique aponeurosis was incised along the course of its fibers to open the external ring and expose the inguinal cord structures. The cord structures were mobilized. A Penrose drain was passed around the cord structures for traction. Cremaster fibers were separated to expose an indirect hernia sac. The sac was dissected free from surrounding tissues and was approximate 4 cm in length. A high ligation of the sac was done with a 4-0 Vicryl suture ligature. The sac was excised and the stump was allowed to retract. The sac was not submitted for pathology.  There was significant weakness of the floor of the inguinal canal with a broad area of bulging. This was repaired with 0 Surgilon sutures beginning at the pubic tubercle suturing the conjoined tendon to the shelving edge of the inguinal ligament incorporating transversalis fascia into the repair. The last stitch led to satisfactory narrowing of the internal ring. A relaxing incision was made medially.  Bard soft mesh was cut to create an oval shape and was placed over the repair. This was sutured to the repair with interrupted 0 Surgilon sutures and also sutured medially to the deep fascia and on both sides of the internal ring. Next after seeing hemostasis was intact the cord structures were  replaced along the floor of the inguinal canal. The cut edges of the external oblique aponeurosis were closed with a running 4-0 Vicryl suture to re-create the external ring. The deep fascia superior and lateral to the repair site was infiltrated with half percent Sensorcaine with epinephrine. Subcutaneous tissues were also infiltrated. The Scarpa's fascia was closed with interrupted 4-0 Vicryl sutures. The skin was closed with running 4-0 Monocryl subcuticular suture and LiquiBand. The testicle remained in the scrotum  The patient appeared to be in satisfactory condition and was prepared for transfer to the recovery room.  Rochel Brome M.D.

## 2015-11-20 NOTE — H&P (Signed)
  He reports no change in condition since office exam.  Labs noted.  Marked right side YES  Discussed plan for right inguinal hernia repair

## 2015-11-21 ENCOUNTER — Telehealth: Payer: Self-pay | Admitting: *Deleted

## 2015-11-21 ENCOUNTER — Ambulatory Visit (INDEPENDENT_AMBULATORY_CARE_PROVIDER_SITE_OTHER): Payer: Medicare Other | Admitting: Urology

## 2015-11-21 ENCOUNTER — Encounter: Payer: Self-pay | Admitting: Urology

## 2015-11-21 ENCOUNTER — Telehealth: Payer: Self-pay

## 2015-11-21 VITALS — BP 148/61 | HR 40 | Temp 98.2°F | Ht 69.0 in | Wt 188.5 lb

## 2015-11-21 DIAGNOSIS — N400 Enlarged prostate without lower urinary tract symptoms: Secondary | ICD-10-CM | POA: Insufficient documentation

## 2015-11-21 DIAGNOSIS — R3911 Hesitancy of micturition: Secondary | ICD-10-CM

## 2015-11-21 DIAGNOSIS — R339 Retention of urine, unspecified: Secondary | ICD-10-CM

## 2015-11-21 DIAGNOSIS — I25810 Atherosclerosis of coronary artery bypass graft(s) without angina pectoris: Secondary | ICD-10-CM | POA: Insufficient documentation

## 2015-11-21 LAB — BLADDER SCAN AMB NON-IMAGING: SCAN RESULT: 440

## 2015-11-21 NOTE — Telephone Encounter (Signed)
Pt called back and reported he was feeling find. I instructed him if he develop any lightheadedness, dizziness, chest pain or blurry vision to go to the ER.

## 2015-11-21 NOTE — Telephone Encounter (Signed)
Called pt, no answer to f/u with him to see how he's during since his office visit. Pt heart rate was low today in the office, but he reported no chest pain, dizziness, lightheadedness or blurry vision while he was in the office. lmom to return my call.

## 2015-11-21 NOTE — Telephone Encounter (Signed)
The patient had called answering service regarding urination. I spoke with the patient and he states Dr Tamala Julian is aware and he has an appointment with Dr [name] today, appreciates phone call.

## 2015-11-21 NOTE — Progress Notes (Signed)
11/21/2015 1:59 PM   Joshua Frye 1935/05/08 [id number]  Referring provider: [name], MD [facility] [name], [name] [address]  Chief Complaint  Patient presents with  . Follow-up    post-op urinary retention     HPI: Pt underwent hernia surgery yesterday and since then has been having intermittent stream, incomplete bladder emptying, weak stream, frequency. He's had no gross hematuria.   His PVR is 470 ml.   He has a h/o BPH and sees [name]. He is status post PVP in November 2006. More recently he noted some decreased force and caliber of his urinary stream. He was started on tamsulosin and noted improvement. He had a nl DRE Feb 2017 with a 50g prostate. His PSA was 1.05.    PMH: Past Medical History:  Diagnosis Date  . Asthma   . BPH (benign prostatic hypertrophy) 2017  . Bronchiectasis ([place])   . COPD (chronic obstructive pulmonary disease) ([place])   . Coronary artery disease   . Dysrhythmia    asymptomatic pvcs  . GERD (gastroesophageal reflux disease)   . Hyperlipidemia   . Hypertension   . Myocardial infarction ([place]) 1985  . Sleep apnea     Surgical History: Past Surgical History:  Procedure Laterality Date  . APPENDECTOMY    . CORONARY ANGIOPLASTY     x 2  . CORONARY ARTERY BYPASS GRAFT  1985  . INGUINAL HERNIA REPAIR Right 11/20/2015   Procedure: HERNIA REPAIR INGUINAL ADULT;  Surgeon: [name], MD;  Location: [facility] ORS;  Service: General;  Laterality: Right;    Home Medications:    Medication List       Accurate as of 11/21/15  1:59 PM. Always use your most recent med list.          amLODipine 10 MG tablet Commonly known as:  NORVASC Take 10 mg by mouth daily.   aspirin EC 81 MG tablet Take by mouth.   atorvastatin 20 MG tablet Commonly known as:  LIPITOR TAKE ONE (1) TABLET BY MOUTH EVERY DAY   budesonide 180 MCG/ACT inhaler Commonly known as:  PULMICORT Inhale 1 puff into  the lungs 2 (two) times daily.   doxycycline 100 MG capsule Commonly known as:  VIBRAMYCIN   fluticasone 27.5 MCG/SPRAY nasal spray Commonly known as:  VERAMYST Place 2 sprays into the nose daily.   HYDROcodone-acetaminophen 5-325 MG tablet Commonly known as:  NORCO Take 1-2 tablets by mouth every 4 (four) hours as needed for moderate pain.   isosorbide dinitrate 30 MG tablet Commonly known as:  ISORDIL Take 30 mg by mouth daily.   isosorbide mononitrate 30 MG 24 hr tablet Commonly known as:  IMDUR Take by mouth.   mometasone 50 MCG/ACT nasal spray Commonly known as:  NASONEX Place into the nose.   multivitamin capsule Take 1 capsule by mouth daily.   nitroGLYCERIN 0.4 MG SL tablet Commonly known as:  NITROSTAT Place 0.4 mg under the tongue every 5 (five) minutes as needed for chest pain.   omeprazole 20 MG capsule Commonly known as:  PRILOSEC TAKE ONE (1) CAPSULE EACH DAY   saw palmetto 500 MG capsule Take 900 mg by mouth 2 (two) times daily.   tamsulosin 0.4 MG Caps capsule Commonly known as:  FLOMAX Take 0.4 mg by mouth.   vitamin C 500 MG tablet Commonly known as:  ASCORBIC ACID Take 500 mg by mouth daily.       Allergies:  Allergies  Allergen Reactions  . Procardia [Nifedipine] Other (See Comments)    Other reaction(s): Unknown Other reaction(s): UNKNOWN Gets very woozy with this medication    Family History: No family history on file.  Social History:  reports that he has never smoked. He has never used smokeless tobacco. He reports that he does not drink alcohol or use drugs.  ROS: UROLOGY Frequent Urination?: Yes Hard to postpone urination?: No Burning/pain with urination?: Yes Get up at night to urinate?: Yes Leakage of urine?: Yes Urine stream starts and stops?: No Trouble starting stream?: No Do you have to strain to urinate?: No Blood in urine?: No Urinary tract infection?: No Sexually transmitted disease?: No Injury to kidneys  or bladder?: No Painful intercourse?: No Weak stream?: Yes Erection problems?: No Penile pain?: No  Gastrointestinal Nausea?: No Vomiting?: No Indigestion/heartburn?: No Diarrhea?: No Constipation?: No                                   Physical Exam: BP (!) 148/61   Pulse (!) 40   Temp 98.2 F (36.8 C) (Oral)   Ht 5\' 9"  (1.753 m)   Wt 85.5 kg (188 lb 8 oz)   BMI 27.84 kg/m   Constitutional:  Alert and oriented, No acute distress. HEENT: [place] AT, moist mucus membranes.  Trachea midline, no masses. Cardiovascular: No clubbing, cyanosis, or edema. Respiratory: Normal respiratory effort, no increased work of breathing. GI: Abdomen is soft, nontender, nondistended, no abdominal masses GU: No CVA tenderness. Skin: No rashes, bruises or suspicious lesions. Lymph: No cervical or inguinal adenopathy. Neurologic: Grossly intact, no focal deficits, moving all 4 extremities. Psychiatric: Normal mood and affect.  We discussed the nature r/b of double up on the tamsulosin, CIC or proceed with foley placement. He elected to proceed with a foley.  Procedure: he was prepped and draped - a 16 Fr coude was placed with some difficulty / obs at [facility]. 650 cc of clear urine was drained.   Laboratory Data: Lab Results  Component Value Date   WBC 9.7 11/12/2015   HGB 16.6 11/12/2015   HCT 49.8 11/12/2015   MCV 90.4 11/12/2015   PLT 180 11/12/2015    Lab Results  Component Value Date   CREATININE 1.07 11/12/2015    No results found for: PSA  No results found for: TESTOSTERONE  No results found for: HGBA1C  Urinalysis No results found for: COLORURINE, APPEARANCEUR, LABSPEC, PHURINE, GLUCOSEU, HGBUR, BILIRUBINUR, KETONESUR, PROTEINUR, UROBILINOGEN, NITRITE, LEUKOCYTESUR    Assessment & Plan:   1. Urinary hesitancy -continue tamsulosin - Bladder Scan (Post Void Residual) in office  2. Retention - not uncommon after hernia repair, but there was some obs at the  prostate / BN. Possibly related to BPH regrowth or BNC (PVP in 2006). Foley placed. F/u next Mon or Tues for void trial.   No Follow-up on file.  Joshua Frye, [name] [address] [place],  [address] [phone]

## 2015-11-26 ENCOUNTER — Ambulatory Visit (INDEPENDENT_AMBULATORY_CARE_PROVIDER_SITE_OTHER): Payer: Medicare Other

## 2015-11-26 VITALS — BP 183/81 | HR 43 | Ht 69.0 in | Wt 180.8 lb

## 2015-11-26 DIAGNOSIS — R339 Retention of urine, unspecified: Secondary | ICD-10-CM | POA: Diagnosis not present

## 2015-11-26 NOTE — Progress Notes (Addendum)
Fill and Pull Catheter Removal  Patient is present today for a catheter removal.  Patient was cleaned and prepped in a sterile fashion 22ml of sterile water/ saline was instilled into the bladder when the patient felt the urge to urinate. 27ml of water was then drained from the balloon.  A 16FR foley cath was removed from the bladder no complications were noted .  Patient as then given some time to void on their own.  Patient can void  136ml on their own after some time.  Patient tolerated well.  Preformed by: Toniann Fail, LPN   Follow up/ Additional notes: Once foley was removed pt was not able to hold urine and urine began to flow onto chucks pad. 100cc of urine was caught. Reinforced with pt to drink plenty of fluids today and if not able to urinate by 3pm to RTC. Pt voiced understanding.   Blood pressure (!) 183/81, pulse (!) 43, height 5\' 9"  (1.753 m), weight 180 lb 12.8 oz (82 kg).

## 2015-11-27 ENCOUNTER — Telehealth: Payer: Self-pay | Admitting: Urology

## 2015-11-27 NOTE — Telephone Encounter (Signed)
Pt states Joshua Frye did a "procedure" on him yesterday also states that seems like "everything" has gone down hill since about 3pm, Pt is drinking water but feels like his bladder has "cut off" again. Please advise.

## 2015-11-28 ENCOUNTER — Ambulatory Visit (INDEPENDENT_AMBULATORY_CARE_PROVIDER_SITE_OTHER): Payer: Medicare Other

## 2015-11-28 ENCOUNTER — Telehealth: Payer: Self-pay

## 2015-11-28 DIAGNOSIS — N401 Enlarged prostate with lower urinary tract symptoms: Secondary | ICD-10-CM

## 2015-11-28 DIAGNOSIS — R3914 Feeling of incomplete bladder emptying: Secondary | ICD-10-CM | POA: Diagnosis not present

## 2015-11-28 LAB — BLADDER SCAN AMB NON-IMAGING: Scan Result: 287

## 2015-11-28 NOTE — Telephone Encounter (Signed)
Spoke with pt wife in reference to pt not being able to urinate. Wife stated that last night pt was able to have a good BM and now is able to urinate well. Wife also stated that pt is currently out to breakfast with a friend and if he went to town he must be feeling better. Reinforced with wife if pt was constipated then that can cause the urinating to slow down or be different than normal. Wife voiced understanding.  Requested for pt to return call when he returns.

## 2015-11-28 NOTE — Progress Notes (Signed)
Bladder Scan Patient can void: Performed By: Toniann Fail, LPN    Per Dr.Budzyn pt is to increase flomax to bid. Pt voiced understanding.

## 2015-11-28 NOTE — Telephone Encounter (Signed)
Spoke with pt in reference to urinating issues. Pt voiced concern and requested a to be seen. Made pt aware we could do a PVR today with the nurse to ease his mind and go from there. Pt agreed. Pt was added to nurse schedule today for PVR.

## 2015-11-30 ENCOUNTER — Ambulatory Visit (INDEPENDENT_AMBULATORY_CARE_PROVIDER_SITE_OTHER): Payer: Medicare Other

## 2015-11-30 DIAGNOSIS — R339 Retention of urine, unspecified: Secondary | ICD-10-CM

## 2015-11-30 LAB — BLADDER SCAN AMB NON-IMAGING: SCAN RESULT: 118

## 2015-11-30 NOTE — Progress Notes (Signed)
Bladder Scan: 118 Patient can void: Performed By: Toniann Fail, LPN  Pt requested to come in today for a bladder scan before the weekend.  There were no vitals taken for this visit.

## 2015-12-13 ENCOUNTER — Ambulatory Visit: Payer: Medicare Other

## 2015-12-19 ENCOUNTER — Encounter: Payer: Self-pay | Admitting: Urology

## 2015-12-19 ENCOUNTER — Ambulatory Visit (INDEPENDENT_AMBULATORY_CARE_PROVIDER_SITE_OTHER): Payer: Medicare Other | Admitting: Urology

## 2015-12-19 VITALS — BP 151/83 | HR 61 | Ht 69.0 in | Wt 186.0 lb

## 2015-12-19 DIAGNOSIS — N400 Enlarged prostate without lower urinary tract symptoms: Secondary | ICD-10-CM | POA: Diagnosis not present

## 2015-12-19 DIAGNOSIS — R3911 Hesitancy of micturition: Secondary | ICD-10-CM

## 2015-12-19 LAB — BLADDER SCAN AMB NON-IMAGING: Scan Result: 53

## 2015-12-19 MED ORDER — TAMSULOSIN HCL 0.4 MG PO CAPS: 0.4000 mg | ORAL_CAPSULE | Freq: Two times a day (BID) | ORAL | 3 refills | Status: DC

## 2015-12-19 NOTE — Progress Notes (Signed)
12/19/2015 2:38 PM   Joshua Frye 03/31/1935 [id number]  Referring provider: [name], MD [place] [facility] [place], [place] [address]  Chief Complaint  Patient presents with  . Follow-up    urinary hesitancy     HPI: Pt with hernia surgery Sep 2017 and retention. Foley placed by me in office was difficult with possible BNC. He passed void trial and f/u PVR was 118 ml. PVR today 51 ml. He is taking tamsulosin BID. Stream adequate. AUASS = 9.   He has a h/o BPH and saw [name]. He is status post PVP in November 2006. More recently he noted some decreased force and caliber of his urinary stream. He was started on tamsulosin and noted improvement. He had a nl DRE Feb 2017 with a 50g prostate. His PSA was 1.05.    PMH: Past Medical History:  Diagnosis Date  . Asthma   . BPH (benign prostatic hypertrophy) 2017  . Bronchiectasis ([place])   . COPD (chronic obstructive pulmonary disease) ([place])   . Coronary artery disease   . Dysrhythmia    asymptomatic pvcs  . GERD (gastroesophageal reflux disease)   . Hyperlipidemia   . Hypertension   . Myocardial infarction 1985  . Sleep apnea     Surgical History: Past Surgical History:  Procedure Laterality Date  . APPENDECTOMY    . CORONARY ANGIOPLASTY     x 2  . CORONARY ARTERY BYPASS GRAFT  1985  . INGUINAL HERNIA REPAIR Right 11/20/2015   Procedure: HERNIA REPAIR INGUINAL ADULT;  Surgeon: [name], MD;  Location: [facility] ORS;  Service: General;  Laterality: Right;    Home Medications:    Medication List       Accurate as of 12/19/15  2:38 PM. Always use your most recent med list.          amLODipine 10 MG tablet Commonly known as:  NORVASC Take 10 mg by mouth daily.   aspirin EC 81 MG tablet Take by mouth.   atorvastatin 20 MG tablet Commonly known as:  LIPITOR TAKE ONE (1) TABLET BY MOUTH EVERY DAY   budesonide 180 MCG/ACT inhaler Commonly known as:   PULMICORT Inhale 1 puff into the lungs 2 (two) times daily.   doxycycline 100 MG capsule Commonly known as:  VIBRAMYCIN   fluticasone 27.5 MCG/SPRAY nasal spray Commonly known as:  VERAMYST Place 2 sprays into the nose daily.   isosorbide dinitrate 30 MG tablet Commonly known as:  ISORDIL Take 30 mg by mouth daily.   isosorbide mononitrate 30 MG 24 hr tablet Commonly known as:  IMDUR Take by mouth.   mometasone 50 MCG/ACT nasal spray Commonly known as:  NASONEX Place into the nose.   multivitamin capsule Take 1 capsule by mouth daily.   nitroGLYCERIN 0.4 MG SL tablet Commonly known as:  NITROSTAT Place 0.4 mg under the tongue every 5 (five) minutes as needed for chest pain.   omeprazole 20 MG capsule Commonly known as:  PRILOSEC TAKE ONE (1) CAPSULE EACH DAY   saw palmetto 500 MG capsule Take 900 mg by mouth 2 (two) times daily.   tamsulosin 0.4 MG Caps capsule Commonly known as:  FLOMAX Take 0.4 mg by mouth.   vitamin C 500 MG tablet Commonly known as:  ASCORBIC ACID Take 500 mg by mouth daily.       Allergies:  Allergies  Allergen Reactions  . Procardia [Nifedipine] Other (See Comments)  Other reaction(s): Unknown Other reaction(s): UNKNOWN Gets very woozy with this medication    Family History: No family history on file.  Social History:  reports that he has never smoked. He has never used smokeless tobacco. He reports that he does not drink alcohol or use drugs.  ROS: UROLOGY Frequent Urination?: No Hard to postpone urination?: No Burning/pain with urination?: No Get up at night to urinate?: No Leakage of urine?: No Urine stream starts and stops?: No Trouble starting stream?: No Do you have to strain to urinate?: No Blood in urine?: No Urinary tract infection?: No Sexually transmitted disease?: No Injury to kidneys or bladder?: No Painful intercourse?: No Weak stream?: No Erection problems?: No Penile pain?:  No  Gastrointestinal Nausea?: No Vomiting?: No Indigestion/heartburn?: No Diarrhea?: No Constipation?: No  Constitutional Fever: No Night sweats?: No Weight loss?: No Fatigue?: No  Skin Skin rash/lesions?: No Itching?: No  Eyes Blurred vision?: No Double vision?: No  Ears/Nose/Throat Sore throat?: No Sinus problems?: No  Hematologic/Lymphatic Swollen glands?: No Easy bruising?: No  Cardiovascular Leg swelling?: No Chest pain?: No  Respiratory Cough?: No Shortness of breath?: No  Endocrine Excessive thirst?: No  Musculoskeletal Back pain?: No Joint pain?: No  Neurological Headaches?: No Dizziness?: No  Psychologic Depression?: No Anxiety?: No  Physical Exam: BP (!) 151/83   Pulse 61   Ht 5\' 9"  (1.753 m)   Wt 84.4 kg (186 lb)   BMI 27.47 kg/m   Constitutional:  Alert and oriented, No acute distress. HEENT: [place] AT, moist mucus membranes.  Trachea midline, no masses. Cardiovascular: No clubbing, cyanosis, or edema. Respiratory: Normal respiratory effort, no increased work of breathing. GI: Abdomen is soft, nontender, nondistended, no abdominal masses GU: No CVA tenderness.  Skin: No rashes, bruises or suspicious lesions. Neurologic: Grossly intact, no focal deficits, moving all 4 extremities. Psychiatric: Normal mood and affect.  Laboratory Data: Lab Results  Component Value Date   WBC 9.7 11/12/2015   HGB 16.6 11/12/2015   HCT 49.8 11/12/2015   MCV 90.4 11/12/2015   PLT 180 11/12/2015    Lab Results  Component Value Date   CREATININE 1.07 11/12/2015    No results found for: PSA  No results found for: TESTOSTERONE  No results found for: HGBA1C  Urinalysis No results found for: COLORURINE, APPEARANCEUR, LABSPEC, PHURINE, GLUCOSEU, HGBUR, BILIRUBINUR, KETONESUR, PROTEINUR, UROBILINOGEN, NITRITE, LEUKOCYTESUR  Pertinent Imaging:   Assessment & Plan:    1. Enlarged prostate without lower urinary tract symptoms  (luts) -improved. Continue tamsulosin bid. See in 6 mo for PVR, DRE.   2. Urinary hesitancy -improved - Bladder Scan (Post Void Residual) in office   No Follow-up on file.  [name], [facility] [address], [place], [place] [address] [phone]

## 2015-12-19 NOTE — Addendum Note (Signed)
Addended by: [name] R on: 12/19/2015 02:49 PM   Modules accepted: Orders

## 2016-04-08 ENCOUNTER — Encounter: Payer: Self-pay | Admitting: Surgery

## 2016-06-20 ENCOUNTER — Encounter: Payer: Self-pay | Admitting: Urology

## 2016-06-20 ENCOUNTER — Ambulatory Visit (INDEPENDENT_AMBULATORY_CARE_PROVIDER_SITE_OTHER): Payer: Medicare Other | Admitting: Urology

## 2016-06-20 VITALS — BP 157/72 | HR 40 | Ht 69.0 in | Wt 181.8 lb

## 2016-06-20 DIAGNOSIS — N401 Enlarged prostate with lower urinary tract symptoms: Secondary | ICD-10-CM

## 2016-06-20 LAB — URINALYSIS, COMPLETE
BILIRUBIN UA: NEGATIVE
Glucose, UA: NEGATIVE
Leukocytes, UA: NEGATIVE
NITRITE UA: NEGATIVE
RBC UA: NEGATIVE
SPEC GRAV UA: 1.025 (ref 1.005–1.030)
UUROB: 1 mg/dL (ref 0.2–1.0)
pH, UA: 5 (ref 5.0–7.5)

## 2016-06-20 LAB — BLADDER SCAN AMB NON-IMAGING: SCAN RESULT: 25

## 2016-06-20 LAB — MICROSCOPIC EXAMINATION: RBC, UA: NONE SEEN /hpf (ref 0–?)

## 2016-06-20 NOTE — Progress Notes (Signed)
06/20/2016 2:55 PM   Joshua Frye [birth year] [id number]  Referring provider: [name], MD [facility] [place], [place] [address]  Chief Complaint  Patient presents with  . Benign Prostatic Hypertrophy    HPI: The patient is a 81 year old gentleman with a past medical history of BPH on flomax BID status post PVP in November 2006 who presents today for follow-up.  His most part, his symptoms are well controlled on this medication. Most sites has nocturia 2. Occasionally it may be as high as 4 but this is much less often. He tries to limit fluid intake prior to bedtime. He has a good stream. He feels that he empties his bladder. He does not have intermittency or straining. He does not have urgency or incontinence. His PVR today is 25.  He does have a history of urinary retention in September 2017 following hernia surgery.   PMH: Past Medical History:  Diagnosis Date  . Asthma   . BPH (benign prostatic hypertrophy) 2017  . Bronchiectasis ([place])   . COPD (chronic obstructive pulmonary disease) ([place])   . Coronary artery disease   . Dysrhythmia    asymptomatic pvcs  . GERD (gastroesophageal reflux disease)   . Hyperlipidemia   . Hypertension   . Myocardial infarction 1985  . Sleep apnea     Surgical History: Past Surgical History:  Procedure Laterality Date  . APPENDECTOMY    . CORONARY ANGIOPLASTY     x 2  . CORONARY ARTERY BYPASS GRAFT  1985  . INGUINAL HERNIA REPAIR Right 11/20/2015   Procedure: HERNIA REPAIR INGUINAL ADULT;  Surgeon: [name], MD;  Location: [facility] ORS;  Service: General;  Laterality: Right;    Home Medications:  Allergies as of 06/20/2016      Reactions   Procardia [nifedipine] Other (See Comments)   Other reaction(s): Unknown Other reaction(s): UNKNOWN Gets very woozy with this medication      Medication List       Accurate as of 06/20/16  2:55 PM. Always use your most recent med list.          amLODipine 10 MG tablet Commonly known as:  NORVASC Take 10 mg by mouth daily.   aspirin EC 81 MG tablet Take by mouth.   atorvastatin 20 MG tablet Commonly known as:  LIPITOR TAKE ONE (1) TABLET BY MOUTH EVERY DAY   budesonide 180 MCG/ACT inhaler Commonly known as:  PULMICORT Inhale 1 puff into the lungs 2 (two) times daily.   doxycycline 100 MG capsule Commonly known as:  VIBRAMYCIN   fluticasone 27.5 MCG/SPRAY nasal spray Commonly known as:  VERAMYST Place 2 sprays into the nose daily.   isosorbide dinitrate 30 MG tablet Commonly known as:  ISORDIL Take 30 mg by mouth daily.   isosorbide mononitrate 30 MG 24 hr tablet Commonly known as:  IMDUR Take by mouth.   mometasone 50 MCG/ACT nasal spray Commonly known as:  NASONEX Place into the nose.   multivitamin capsule Take 1 capsule by mouth daily.   nitroGLYCERIN 0.4 MG SL tablet Commonly known as:  NITROSTAT Place 0.4 mg under the tongue every 5 (five) minutes as needed for chest pain.   omeprazole 20 MG capsule Commonly known as:  PRILOSEC TAKE ONE (1) CAPSULE EACH DAY   saw palmetto 500 MG capsule Take 900 mg by mouth 2 (two) times daily.   tamsulosin 0.4 MG Caps capsule Commonly known as:  FLOMAX Take 0.4 mg  by mouth 2 (two) times daily.   tamsulosin 0.4 MG Caps capsule Commonly known as:  FLOMAX Take 1 capsule (0.4 mg total) by mouth 2 (two) times daily.   vitamin C 500 MG tablet Commonly known as:  ASCORBIC ACID Take 500 mg by mouth daily.       Allergies:  Allergies  Allergen Reactions  . Procardia [Nifedipine] Other (See Comments)    Other reaction(s): Unknown Other reaction(s): UNKNOWN Gets very woozy with this medication    Family History: No family history on file.  Social History:  reports that he has never smoked. He has never used smokeless tobacco. He reports that he does not drink alcohol or use drugs.  ROS:                                         Physical Exam: There were no vitals taken for this visit.  Constitutional:  Alert and oriented, No acute distress. HEENT: [place] AT, moist mucus membranes.  Trachea midline, no masses. Cardiovascular: No clubbing, cyanosis, or edema. Respiratory: Normal respiratory effort, no increased work of breathing. GI: Abdomen is soft, nontender, nondistended, no abdominal masses GU: No CVA tenderness.  Skin: No rashes, bruises or suspicious lesions. Lymph: No cervical or inguinal adenopathy. Neurologic: Grossly intact, no focal deficits, moving all 4 extremities. Psychiatric: Normal mood and affect.  Laboratory Data: Lab Results  Component Value Date   WBC 9.7 11/12/2015   HGB 16.6 11/12/2015   HCT 49.8 11/12/2015   MCV 90.4 11/12/2015   PLT 180 11/12/2015    Lab Results  Component Value Date   CREATININE 1.07 11/12/2015    No results found for: PSA  No results found for: TESTOSTERONE  No results found for: HGBA1C  Urinalysis No results found for: COLORURINE, APPEARANCEUR, LABSPEC, PHURINE, GLUCOSEU, HGBUR, BILIRUBINUR, KETONESUR, PROTEINUR, UROBILINOGEN, NITRITE, LEUKOCYTESUR   Assessment & Plan:    1. BPH -symptoms well controlled -continue flomax 0.4 mg BID -follow up annually  Return in about 1 year (around 06/20/2017).  Nickie Retort, MD  [facility] [address], [place], [place] [address] [phone]

## 2016-06-24 DIAGNOSIS — E039 Hypothyroidism, unspecified: Secondary | ICD-10-CM | POA: Insufficient documentation

## 2016-09-26 ENCOUNTER — Emergency Department
Admission: EM | Admit: 2016-09-26 | Discharge: 2016-09-27 | Disposition: A | Discharge status: Home / Self Care | Payer: Medicare Other | Attending: Emergency Medicine | Admitting: Emergency Medicine

## 2016-09-26 ENCOUNTER — Emergency Department: Payer: Medicare Other

## 2016-09-26 DIAGNOSIS — W540XXA Bitten by dog, initial encounter: Secondary | ICD-10-CM | POA: Diagnosis not present

## 2016-09-26 DIAGNOSIS — J45909 Unspecified asthma, uncomplicated: Secondary | ICD-10-CM | POA: Insufficient documentation

## 2016-09-26 DIAGNOSIS — R05 Cough: Secondary | ICD-10-CM | POA: Diagnosis not present

## 2016-09-26 DIAGNOSIS — Z7982 Long term (current) use of aspirin: Secondary | ICD-10-CM | POA: Insufficient documentation

## 2016-09-26 DIAGNOSIS — Z79899 Other long term (current) drug therapy: Secondary | ICD-10-CM | POA: Diagnosis not present

## 2016-09-26 DIAGNOSIS — Z23 Encounter for immunization: Secondary | ICD-10-CM | POA: Diagnosis not present

## 2016-09-26 DIAGNOSIS — Y929 Unspecified place or not applicable: Secondary | ICD-10-CM | POA: Diagnosis not present

## 2016-09-26 DIAGNOSIS — Y93K9 Activity, other involving animal care: Secondary | ICD-10-CM | POA: Diagnosis not present

## 2016-09-26 DIAGNOSIS — S61451A Open bite of right hand, initial encounter: Secondary | ICD-10-CM | POA: Diagnosis not present

## 2016-09-26 DIAGNOSIS — J449 Chronic obstructive pulmonary disease, unspecified: Secondary | ICD-10-CM | POA: Diagnosis not present

## 2016-09-26 DIAGNOSIS — S6991XA Unspecified injury of right wrist, hand and finger(s), initial encounter: Secondary | ICD-10-CM | POA: Diagnosis present

## 2016-09-26 DIAGNOSIS — I10 Essential (primary) hypertension: Secondary | ICD-10-CM | POA: Diagnosis not present

## 2016-09-26 DIAGNOSIS — Y999 Unspecified external cause status: Secondary | ICD-10-CM | POA: Insufficient documentation

## 2016-09-26 DIAGNOSIS — I2510 Atherosclerotic heart disease of native coronary artery without angina pectoris: Secondary | ICD-10-CM | POA: Diagnosis not present

## 2016-09-26 MED ORDER — TETANUS-DIPHTH-ACELL PERTUSSIS 5-2.5-18.5 LF-MCG/0.5 IM SUSP
0.5000 mL | Freq: Once | INTRAMUSCULAR | Status: AC
  Administered 2016-09-26: 0.5 mL via INTRAMUSCULAR
  Filled 2016-09-26: qty 0.5

## 2016-09-26 MED ORDER — AMOXICILLIN-POT CLAVULANATE 875-125 MG PO TABS
1.0000 | ORAL_TABLET | Freq: Once | ORAL | Status: AC
  Administered 2016-09-26: 1 via ORAL
  Filled 2016-09-26: qty 1

## 2016-09-26 MED ORDER — AMOXICILLIN-POT CLAVULANATE 875-125 MG PO TABS: 1.0000 | ORAL_TABLET | Freq: Two times a day (BID) | ORAL | 0 refills | Status: AC

## 2016-09-26 NOTE — ED Notes (Signed)
Reviewed d/c instructions, follow-up care, prescriptions, wound care with patient. Pt verbalized understanding.

## 2016-09-26 NOTE — ED Provider Notes (Signed)
[facility] Emergency Department Provider Note  Time seen: 10:29 PM  I have reviewed the triage vital signs and the nursing notes.   HISTORY  Chief Complaint Animal Bite    HPI Joshua Frye is a 81 y.o. male with a past medical history of COPD, gastric reflux, hypertension, hyperlipidemia, bradycardia, presents to the emergency department after a dog bite. According to the patient he went to pick up his dog who was on his daughter's LAD. States the ground at him and bit at his right hand. Patient got a small skin tear to the back of the right hand so he came to the emergency department. Patient denies any pain. Denies any other injuries. Review of systems negative besides cough which she states has been ongoing for a proximate 1 month. He is currently being treated by his primary doctor with prednisone. Denies any fever.  Past Medical History:  Diagnosis Date  . Asthma   . BPH (benign prostatic hypertrophy) 2017  . Bronchiectasis ([place])   . COPD (chronic obstructive pulmonary disease) ([place])   . Coronary artery disease   . Dysrhythmia    asymptomatic pvcs  . GERD (gastroesophageal reflux disease)   . Hyperlipidemia   . Hypertension   . Myocardial infarction ([employer]) 1985  . Sleep apnea     Patient Active Problem List   Diagnosis Date Noted  . BPH (benign prostatic hyperplasia) 11/21/2015  . CAD (coronary artery disease), autologous vein bypass graft 11/21/2015  . Adult bronchiectasis ([place]) 06/07/2015  . Asymptomatic PVCs 04/16/2015  . Hyperlipidemia, mixed 06/06/2014  . 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 Benign essential hypertension 03/07/2014  . Chronic obstructive pulmonary disease ([place]) 03/07/2014  . Coronary arteriosclerosis in native artery 03/07/2014  . Enlarged prostate without lower urinary tract symptoms (luts) 03/07/2014  . Hyperlipidemia 03/07/2014  . Hypersomnia with sleep apnea 03/07/2014  .  Asthma 08/11/2013  . Sleep apnea 08/11/2013  . Sleep apnea with use of continuous positive airway pressure (CPAP) 08/11/2013  . Enlarged prostate with lower urinary tract symptoms (LUTS) 02/23/2012  . Nocturia 02/23/2012    Past Surgical History:  Procedure Laterality Date  . APPENDECTOMY    . CORONARY ANGIOPLASTY     x 2  . CORONARY ARTERY BYPASS GRAFT  1985  . INGUINAL HERNIA REPAIR Right 11/20/2015   Procedure: HERNIA REPAIR INGUINAL ADULT;  Surgeon: [name], MD;  Location: [facility] ORS;  Service: General;  Laterality: Right;    Prior to Admission medications   Medication Sig Start Date End Date Taking? Authorizing Provider  amLODipine (NORVASC) 10 MG tablet Take 10 mg by mouth daily.    [provider]  aspirin EC 81 MG tablet Take by mouth.    [provider]  atorvastatin (LIPITOR) 20 MG tablet TAKE ONE (1) TABLET BY MOUTH EVERY DAY 09/05/15   [provider]  budesonide (PULMICORT) 180 MCG/ACT inhaler Inhale 1 puff into the lungs 2 (two) times daily.    [provider]  cyanocobalamin 1000 MCG tablet Take 1,000 mcg by mouth daily.    [provider]  doxycycline (VIBRAMYCIN) 100 MG capsule  10/08/15   [provider]  fluticasone (VERAMYST) 27.5 MCG/SPRAY nasal spray Place 2 sprays into the nose daily.    [provider]  isosorbide mononitrate (IMDUR) 30 MG 24 hr tablet Take by mouth. 09/07/14   [provider]  mometasone (NASONEX) 50 MCG/ACT nasal spray Place into the nose.  [provider]  Multiple Vitamin (MULTIVITAMIN) capsule Take 1 capsule by mouth daily.    [provider]  nitroGLYCERIN (NITROSTAT) 0.4 MG SL tablet Place 0.4 mg under the tongue every 5 (five) minutes as needed for chest pain.    [provider]  omeprazole (PRILOSEC) 20 MG capsule TAKE ONE (1) CAPSULE EACH DAY 09/05/15   [provider]  saw palmetto 500 MG capsule Take 900 mg by mouth 2  (two) times daily.    [provider]  tamsulosin (FLOMAX) 0.4 MG CAPS capsule Take 1 capsule (0.4 mg total) by mouth 2 (two) times daily. 12/19/15   [name], MD  vitamin C (ASCORBIC ACID) 500 MG tablet Take 500 mg by mouth daily.    [provider]    Allergies  Allergen Reactions  . Procardia [Nifedipine] Other (See Comments)    Other reaction(s): Unknown Other reaction(s): UNKNOWN Gets very woozy with this medication    No family history on file.  Social History Social History  Substance Use Topics  . Smoking status: Never Smoker  . Smokeless tobacco: Never Used  . Alcohol use No    Review of Systems Constitutional: Negative for fever. ENT: Mild congestion 1 month, mostly in the morning Cardiovascular: Negative for chest pain. Respiratory: Negative for shortness of breath. Positive for cough. Gastrointestinal: Negative for abdominal pain Musculoskeletal: Negative for back pain. Neurological: Negative for headache All other ROS negative  ____________________________________________   PHYSICAL EXAM:  VITAL SIGNS: ED Triage Vitals  Enc Vitals Group     BP 09/26/16 2109 (!) 161/51     Pulse Rate 09/26/16 2109 (!) 35     Resp 09/26/16 2109 20     Temp 09/26/16 2109 97.9 F (36.6 C)     Temp Source 09/26/16 2109 Oral     SpO2 09/26/16 2109 96 %     Weight 09/26/16 2106 184 lb (83.5 kg)     Height 09/26/16 2106 5\' 7"  (1.702 m)     Head Circumference --      Peak Flow --      Pain Score 09/26/16 2200 6     Pain Loc --      Pain Edu? --      Excl. in [place]? --     Constitutional: Alert and oriented. Well appearing and in no distress. Eyes: Normal exam ENT   Head: Normocephalic and atraumatic.   Mouth/Throat: Mucous membranes are moist. Cardiovascular: Normal rate, regular rhythm. No murmur Respiratory: Normal respiratory effort without tachypnea nor retractions. Breath sounds are clear  Gastrointestinal: Soft and nontender. No  distention.   Musculoskeletal: 2 small skin tears to the dorsal aspect of the right hand, no other injuries. Hemostatic. Neurologic:  Normal speech and language. No gross focal neurologic deficits  Skin:  Skin is warm. 2 small skin tears to the dorsal aspect of the right hand. Psychiatric: Mood and affect are normal.    RADIOLOGY  Chest x-ray negative   EKG shows a likely junctional rhythm versus bigeminy around 65 bpm. Widened QRS, normal axis, slightly prolonged QTC, nonspecific ST changes. No ST elevation noted. ____________________________________________   INITIAL IMPRESSION / ASSESSMENT AND PLAN / ED COURSE  Pertinent labs & imaging results that were available during my care of the patient were reviewed by me and considered in my medical decision making (see chart for details).  Patient presents after a dog bite. It is the family's dog up-to-date on vaccines. Patient has 2 small skin tears  the dorsal aspect of the right hand. We will wash and cover with Xeroform and gauze. There currently hemostatic. On review of systems patient does state for the past one month he has been coughing in fact he saw his primary care doctor for this week as it has not gone away. He was prescribed prednisone but continues to have a mild cough. Denies sputum production or fever. Denies chest pain. Patient's clear lung sounds bilaterally. We'll obtain a chest x-ray will in the emergency department as a precaution. Patient agreeable to plan. We will place the patient on Augmentin as precaution for his dog bite. We will update tetanus if needed.  Of note the patient had an EKG performed due to bradycardia. Patient is bradycardic on EKG with frequent PVCs versus junctional rhythm. I reviewed the patient's old EKG from September of last year which is fairly unchanged. The patient sees Dr. Ubaldo Glassing as his cardiologist who has been seen for bradycardia in the past including a Holter monitor. It is well documented in  the patient is completely asymptomatic. I do not believe further workup is ordered at this time.  Chest x-ray is negative. We will discharge the patient home with Augmentin and PCP follow-up.  ____________________________________________   FINAL CLINICAL IMPRESSION(S) / ED DIAGNOSES  Dog bite Cough    Harvest Dark, MD 09/26/16 2300

## 2016-09-26 NOTE — ED Triage Notes (Signed)
Reports went to take his dog out side and it bite him.  Reports skin tear.

## 2016-09-26 NOTE — ED Notes (Signed)
During triage patient reports that PMD noted low heart rate in 40's.  During triage heart rate in 30's, patient is asymtompatic with this.  He is awake, alert and oriented x 4 with skin warm and dry.

## 2016-10-08 ENCOUNTER — Telehealth: Payer: Self-pay | Admitting: Urology

## 2016-10-08 ENCOUNTER — Encounter
Admission: RE | Admit: 2016-10-08 | Discharge: 2016-10-08 | Disposition: A | Discharge status: Home / Self Care | Payer: Medicare Other | Source: Ambulatory Visit | Attending: Cardiology | Admitting: Cardiology

## 2016-10-08 DIAGNOSIS — I25810 Atherosclerosis of coronary artery bypass graft(s) without angina pectoris: Secondary | ICD-10-CM | POA: Diagnosis not present

## 2016-10-08 DIAGNOSIS — N401 Enlarged prostate with lower urinary tract symptoms: Secondary | ICD-10-CM | POA: Insufficient documentation

## 2016-10-08 DIAGNOSIS — Z01812 Encounter for preprocedural laboratory examination: Secondary | ICD-10-CM | POA: Insufficient documentation

## 2016-10-08 DIAGNOSIS — R351 Nocturia: Secondary | ICD-10-CM | POA: Diagnosis not present

## 2016-10-08 DIAGNOSIS — G4710 Hypersomnia, unspecified: Secondary | ICD-10-CM | POA: Insufficient documentation

## 2016-10-08 DIAGNOSIS — I10 Essential (primary) hypertension: Secondary | ICD-10-CM | POA: Diagnosis not present

## 2016-10-08 DIAGNOSIS — Z01818 Encounter for other preprocedural examination: Secondary | ICD-10-CM

## 2016-10-08 DIAGNOSIS — E785 Hyperlipidemia, unspecified: Secondary | ICD-10-CM | POA: Diagnosis not present

## 2016-10-08 DIAGNOSIS — N400 Enlarged prostate without lower urinary tract symptoms: Secondary | ICD-10-CM | POA: Insufficient documentation

## 2016-10-08 DIAGNOSIS — J449 Chronic obstructive pulmonary disease, unspecified: Secondary | ICD-10-CM | POA: Insufficient documentation

## 2016-10-08 HISTORY — DX: Anxiety disorder, unspecified: F41.9

## 2016-10-08 LAB — CBC
HEMATOCRIT: 44.7 % (ref 40.0–52.0)
HEMOGLOBIN: 15 g/dL (ref 13.0–18.0)
MCH: 29.8 pg (ref 26.0–34.0)
MCHC: 33.6 g/dL (ref 32.0–36.0)
MCV: 88.8 fL (ref 80.0–100.0)
Platelets: 157 10*3/uL (ref 150–440)
RBC: 5.03 MIL/uL (ref 4.40–5.90)
RDW: 15.4 % — ABNORMAL HIGH (ref 11.5–14.5)
WBC: 8.1 10*3/uL (ref 3.8–10.6)

## 2016-10-08 LAB — APTT: aPTT: 31 seconds (ref 24–36)

## 2016-10-08 LAB — BASIC METABOLIC PANEL
Anion gap: 8 (ref 5–15)
BUN: 21 mg/dL — ABNORMAL HIGH (ref 6–20)
CALCIUM: 9 mg/dL (ref 8.9–10.3)
CHLORIDE: 108 mmol/L (ref 101–111)
CO2: 25 mmol/L (ref 22–32)
Creatinine, Ser: 1.35 mg/dL — ABNORMAL HIGH (ref 0.61–1.24)
GFR calc Af Amer: 56 mL/min — ABNORMAL LOW (ref >60)
GFR calc non Af Amer: 48 mL/min — ABNORMAL LOW (ref >60)
GLUCOSE: 105 mg/dL — AB (ref 65–99)
Potassium: 3.9 mmol/L (ref 3.5–5.1)
Sodium: 141 mmol/L (ref 135–145)

## 2016-10-08 LAB — SURGICAL PCR SCREEN
MRSA, PCR: NEGATIVE
STAPHYLOCOCCUS AUREUS: NEGATIVE

## 2016-10-08 LAB — PROTIME-INR
INR: 1.13
Prothrombin Time: 14.6 seconds (ref 11.4–15.2)

## 2016-10-08 NOTE — Patient Instructions (Addendum)
Your procedure is scheduled on: 10/15/16 Wed Report to Same Day Surgery 2nd floor medical mall [facility] Entrance-take elevator on left to 2nd floor.  Check in with surgery information desk.) To find out your arrival time please call [phone] between 1PM - 3PM on 10/14/16 Tues Remember: Instructions that are not followed completely may result in serious medical risk, up to and including death, or upon the discretion of your surgeon and anesthesiologist your surgery may need to be rescheduled.    _x___ 1. Do not eat food or drink liquids after midnight. No gum chewing or                              hard candies.     __x__ 2. No Alcohol for 24 hours before or after surgery.   __x__3. No Smoking for 24 prior to surgery.   ____  4. Bring all medications with you on the day of surgery if instructed.    __x__ 5. Notify your doctor if there is any change in your medical condition     (cold, fever, infections).     Do not wear jewelry, make-up, hairpins, clips or nail polish.  Do not wear lotions, powders, or perfumes. You may wear deodorant.  Do not shave 48 hours prior to surgery. Men may shave face and neck.  Do not bring valuables to the hospital.    [facility] is not responsible for any belongings or valuables.               Contacts, dentures or bridgework may not be worn into surgery.  Leave your suitcase in the car. After surgery it may be brought to your room.  For patients admitted to the hospital, discharge time is determined by your                       treatment team.   Patients discharged the day of surgery will not be allowed to drive home.  You will need someone to drive you home and stay with you the night of your procedure.    Please read over the following fact sheets that you were given:   [facility] Preparing for Surgery and or MRSA Information   _x___ Take anti-hypertensive (unless it includes a diuretic), cardiac, seizure, asthma,     anti-reflux and  psychiatric medicines. These include:  1. amLODipine (NORVASC) 10   2.budesonide (PULMICORT)   3.isosorbide mononitrate   4.omeprazole (PRILOSEC)   5.  6.  ____Fleets enema or Magnesium Citrate as directed.   _x___ Use CHG Soap or sage wipes as directed on instruction sheet   ____ Use inhalers on the day of surgery and bring to hospital day of surgery  ____ Stop Metformin and Janumet 2 days prior to surgery.    ____ Take 1/2 of usual insulin dose the night before surgery and none on the morning     surgery.   _x___ Follow recommendations from Cardiologist, Pulmonologist or PCP regarding          stopping Aspirin, Coumadin, Pllavix ,Eliquis, Effient, or Pradaxa, and Pletal.  X____Stop Anti-inflammatories such as Advil, Aleve, Ibuprofen, Motrin, Naproxen, Naprosyn, Goodies powders or aspirin products. Stop Aspirin 1 week before surgery. OK to take Tylenol and                          Celebrex.  _x___ Stop supplements until after surgery.  But may continue Vitamin D, Vitamin B,       and multivitamin.   ____ Bring C-Pap to the hospital.

## 2016-10-08 NOTE — Telephone Encounter (Signed)
Patient called the office this afternoon and is requesting a call back from [place].  He can be reached at [phone].

## 2016-10-09 NOTE — Telephone Encounter (Signed)
Left message with pt wife for pt to return call.

## 2016-10-14 MED ORDER — CEFAZOLIN SODIUM-DEXTROSE 1-4 GM/50ML-% IV SOLN
1.0000 g | Freq: Once | INTRAVENOUS | Status: AC
  Administered 2016-10-15: 1 g via INTRAVENOUS

## 2016-10-14 NOTE — Telephone Encounter (Signed)
LMOM

## 2016-10-15 ENCOUNTER — Observation Stay: Payer: Medicare Other

## 2016-10-15 ENCOUNTER — Encounter: Payer: Self-pay | Admitting: *Deleted

## 2016-10-15 ENCOUNTER — Ambulatory Visit: Payer: Medicare Other | Admitting: Anesthesiology

## 2016-10-15 ENCOUNTER — Observation Stay
Admission: RE | Admit: 2016-10-15 | Discharge: 2016-10-16 | Disposition: A | Discharge status: Home / Self Care | Payer: Medicare Other | Source: Ambulatory Visit | Attending: Cardiology | Admitting: Cardiology

## 2016-10-15 ENCOUNTER — Encounter
Admission: RE | Disposition: A | Discharge status: Home / Self Care | Payer: Self-pay | Source: Ambulatory Visit | Attending: Cardiology

## 2016-10-15 ENCOUNTER — Ambulatory Visit: Payer: Medicare Other

## 2016-10-15 DIAGNOSIS — Z79899 Other long term (current) drug therapy: Secondary | ICD-10-CM | POA: Insufficient documentation

## 2016-10-15 DIAGNOSIS — N400 Enlarged prostate without lower urinary tract symptoms: Secondary | ICD-10-CM | POA: Diagnosis not present

## 2016-10-15 DIAGNOSIS — G4730 Sleep apnea, unspecified: Secondary | ICD-10-CM | POA: Insufficient documentation

## 2016-10-15 DIAGNOSIS — R001 Bradycardia, unspecified: Secondary | ICD-10-CM | POA: Diagnosis not present

## 2016-10-15 DIAGNOSIS — I2510 Atherosclerotic heart disease of native coronary artery without angina pectoris: Secondary | ICD-10-CM | POA: Diagnosis not present

## 2016-10-15 DIAGNOSIS — I10 Essential (primary) hypertension: Secondary | ICD-10-CM | POA: Diagnosis not present

## 2016-10-15 DIAGNOSIS — E782 Mixed hyperlipidemia: Secondary | ICD-10-CM | POA: Insufficient documentation

## 2016-10-15 DIAGNOSIS — J449 Chronic obstructive pulmonary disease, unspecified: Secondary | ICD-10-CM | POA: Diagnosis not present

## 2016-10-15 DIAGNOSIS — I495 Sick sinus syndrome: Secondary | ICD-10-CM | POA: Diagnosis present

## 2016-10-15 DIAGNOSIS — Z7982 Long term (current) use of aspirin: Secondary | ICD-10-CM | POA: Insufficient documentation

## 2016-10-15 DIAGNOSIS — I4891 Unspecified atrial fibrillation: Secondary | ICD-10-CM | POA: Diagnosis not present

## 2016-10-15 DIAGNOSIS — Z950 Presence of cardiac pacemaker: Secondary | ICD-10-CM

## 2016-10-15 HISTORY — PX: PACEMAKER INSERTION: SHX728

## 2016-10-15 SURGERY — INSERTION, CARDIAC PACEMAKER
Anesthesia: General | Site: Shoulder | Laterality: Left | Wound class: Clean

## 2016-10-15 MED ORDER — CEFAZOLIN SODIUM-DEXTROSE 1-4 GM/50ML-% IV SOLN
INTRAVENOUS | Status: AC
  Filled 2016-10-15: qty 50

## 2016-10-15 MED ORDER — SODIUM CHLORIDE 0.9 % IJ SOLN
INTRAMUSCULAR | Status: AC
  Filled 2016-10-15: qty 50

## 2016-10-15 MED ORDER — LACTATED RINGERS IV SOLN
INTRAVENOUS | Status: DC
  Administered 2016-10-15: 12:00:00 via INTRAVENOUS

## 2016-10-15 MED ORDER — SODIUM CHLORIDE 0.9 % IR SOLN
Freq: Once | Status: DC
  Filled 2016-10-15: qty 2

## 2016-10-15 MED ORDER — NITROGLYCERIN 0.4 MG SL SUBL: 0.4000 mg | SUBLINGUAL_TABLET | SUBLINGUAL | Status: DC | PRN

## 2016-10-15 MED ORDER — MIDAZOLAM HCL 5 MG/5ML IJ SOLN
INTRAMUSCULAR | Status: DC | PRN
  Administered 2016-10-15: 1 mg via INTRAVENOUS

## 2016-10-15 MED ORDER — EPHEDRINE SULFATE 50 MG/ML IJ SOLN
INTRAMUSCULAR | Status: DC | PRN
  Administered 2016-10-15 (×3): 10 mg via INTRAVENOUS

## 2016-10-15 MED ORDER — PHENYLEPHRINE HCL 10 MG/ML IJ SOLN
INTRAMUSCULAR | Status: AC
  Filled 2016-10-15: qty 1

## 2016-10-15 MED ORDER — ONDANSETRON HCL 4 MG/2ML IJ SOLN: 4.0000 mg | Freq: Once | INTRAMUSCULAR | Status: DC | PRN

## 2016-10-15 MED ORDER — FENTANYL CITRATE (PF) 100 MCG/2ML IJ SOLN
INTRAMUSCULAR | Status: AC
  Filled 2016-10-15: qty 2

## 2016-10-15 MED ORDER — FENTANYL CITRATE (PF) 100 MCG/2ML IJ SOLN
INTRAMUSCULAR | Status: DC | PRN
  Administered 2016-10-15: 50 ug via INTRAVENOUS

## 2016-10-15 MED ORDER — GENTAMICIN SULFATE 40 MG/ML IJ SOLN
INTRAMUSCULAR | Status: AC
  Filled 2016-10-15: qty 2

## 2016-10-15 MED ORDER — CEFAZOLIN SODIUM-DEXTROSE 1-4 GM/50ML-% IV SOLN
1.0000 g | Freq: Three times a day (TID) | INTRAVENOUS | Status: AC
  Administered 2016-10-15 – 2016-10-16 (×3): 1 g via INTRAVENOUS
  Filled 2016-10-15 (×3): qty 50

## 2016-10-15 MED ORDER — LIDOCAINE 1 % OPTIME INJ - NO CHARGE
INTRAMUSCULAR | Status: DC | PRN
  Administered 2016-10-15: 30 mL

## 2016-10-15 MED ORDER — EPHEDRINE SULFATE 50 MG/ML IJ SOLN
INTRAMUSCULAR | Status: AC
  Filled 2016-10-15: qty 1

## 2016-10-15 MED ORDER — ATORVASTATIN CALCIUM 20 MG PO TABS
20.0000 mg | ORAL_TABLET | Freq: Every day | ORAL | Status: DC
  Administered 2016-10-15: 20 mg via ORAL
  Filled 2016-10-15: qty 1

## 2016-10-15 MED ORDER — ACETAMINOPHEN 325 MG PO TABS: 325.0000 mg | ORAL_TABLET | ORAL | Status: DC | PRN

## 2016-10-15 MED ORDER — BUDESONIDE 0.25 MG/2ML IN SUSP
0.2500 mg | Freq: Two times a day (BID) | RESPIRATORY_TRACT | Status: DC
  Administered 2016-10-15 – 2016-10-16 (×2): 0.25 mg via RESPIRATORY_TRACT
  Filled 2016-10-15 (×2): qty 2

## 2016-10-15 MED ORDER — MIDAZOLAM HCL 2 MG/2ML IJ SOLN
INTRAMUSCULAR | Status: AC
  Filled 2016-10-15: qty 2

## 2016-10-15 MED ORDER — SODIUM CHLORIDE 0.9 % IJ SOLN
INTRAMUSCULAR | Status: AC
  Filled 2016-10-15: qty 10

## 2016-10-15 MED ORDER — PANTOPRAZOLE SODIUM 40 MG PO TBEC
40.0000 mg | DELAYED_RELEASE_TABLET | Freq: Every day | ORAL | Status: DC
  Administered 2016-10-16: 40 mg via ORAL
  Filled 2016-10-15: qty 1

## 2016-10-15 MED ORDER — AMLODIPINE BESYLATE 10 MG PO TABS
10.0000 mg | ORAL_TABLET | Freq: Every day | ORAL | Status: DC
  Administered 2016-10-16: 10 mg via ORAL
  Filled 2016-10-15: qty 1

## 2016-10-15 MED ORDER — FLUTICASONE PROPIONATE 50 MCG/ACT NA SUSP
2.0000 | Freq: Every day | NASAL | Status: DC
  Administered 2016-10-15: 2 via NASAL
  Filled 2016-10-15: qty 16

## 2016-10-15 MED ORDER — PROPOFOL 500 MG/50ML IV EMUL
INTRAVENOUS | Status: DC | PRN
  Administered 2016-10-15: 50 ug/kg/min via INTRAVENOUS

## 2016-10-15 MED ORDER — PHENYLEPHRINE HCL 10 MG/ML IJ SOLN
INTRAMUSCULAR | Status: DC | PRN
  Administered 2016-10-15 (×2): 100 ug via INTRAVENOUS

## 2016-10-15 MED ORDER — MONTELUKAST SODIUM 10 MG PO TABS
10.0000 mg | ORAL_TABLET | Freq: Every day | ORAL | Status: DC
  Administered 2016-10-15: 10 mg via ORAL
  Filled 2016-10-15: qty 1

## 2016-10-15 MED ORDER — ONDANSETRON HCL 4 MG/2ML IJ SOLN: 4.0000 mg | Freq: Four times a day (QID) | INTRAMUSCULAR | Status: DC | PRN

## 2016-10-15 MED ORDER — LIDOCAINE 2% (20 MG/ML) 5 ML SYRINGE
INTRAMUSCULAR | Status: DC | PRN
  Administered 2016-10-15: 20 mg via INTRAVENOUS

## 2016-10-15 MED ORDER — FENTANYL CITRATE (PF) 100 MCG/2ML IJ SOLN: 25.0000 ug | INTRAMUSCULAR | Status: DC | PRN

## 2016-10-15 MED ORDER — SODIUM CHLORIDE 0.9 % IR SOLN
Status: DC | PRN
  Administered 2016-10-15: 200 mL

## 2016-10-15 MED ORDER — PROPOFOL 10 MG/ML IV BOLUS
INTRAVENOUS | Status: AC
  Filled 2016-10-15: qty 40

## 2016-10-15 MED ORDER — ISOSORBIDE MONONITRATE ER 30 MG PO TB24
30.0000 mg | ORAL_TABLET | Freq: Every day | ORAL | Status: DC
  Administered 2016-10-16: 30 mg via ORAL
  Filled 2016-10-15: qty 1

## 2016-10-15 MED ORDER — LACTATED RINGERS IV SOLN
INTRAVENOUS | Status: DC
  Administered 2016-10-15 (×2): via INTRAVENOUS

## 2016-10-15 MED ORDER — PROPOFOL 10 MG/ML IV BOLUS
INTRAVENOUS | Status: DC | PRN
  Administered 2016-10-15: 20 mg via INTRAVENOUS

## 2016-10-15 MED ORDER — TAMSULOSIN HCL 0.4 MG PO CAPS
0.4000 mg | ORAL_CAPSULE | Freq: Every day | ORAL | Status: DC
  Administered 2016-10-15 – 2016-10-16 (×2): 0.4 mg via ORAL
  Filled 2016-10-15 (×2): qty 1

## 2016-10-15 SURGICAL SUPPLY — 38 items
BAG DECANTER FOR FLEXI CONT (MISCELLANEOUS) ×3 IMPLANT
BRUSH SCRUB EZ  4% CHG (MISCELLANEOUS) ×2
BRUSH SCRUB EZ 4% CHG (MISCELLANEOUS) ×1 IMPLANT
CABLE SURG 12 DISP A/V CHANNEL (MISCELLANEOUS) ×3 IMPLANT
CANISTER SUCT 1200ML W/VALVE (MISCELLANEOUS) ×3 IMPLANT
CHLORAPREP W/TINT 26ML (MISCELLANEOUS) ×3 IMPLANT
COVER LIGHT HANDLE STERIS (MISCELLANEOUS) ×6 IMPLANT
COVER MAYO STAND STRL (DRAPES) ×3 IMPLANT
DRAPE C-ARM XRAY 36X54 (DRAPES) ×3 IMPLANT
DRSG TEGADERM 4X4.75 (GAUZE/BANDAGES/DRESSINGS) ×3 IMPLANT
DRSG TELFA 4X3 1S NADH ST (GAUZE/BANDAGES/DRESSINGS) ×6 IMPLANT
ELECT REM PT RETURN 9FT ADLT (ELECTROSURGICAL) ×3
ELECTRODE REM PT RTRN 9FT ADLT (ELECTROSURGICAL) ×1 IMPLANT
GLOVE BIO SURGEON STRL SZ7.5 (GLOVE) ×6 IMPLANT
GLOVE BIO SURGEON STRL SZ8 (GLOVE) ×3 IMPLANT
GOWN STRL REUS W/ TWL LRG LVL3 (GOWN DISPOSABLE) ×1 IMPLANT
GOWN STRL REUS W/ TWL XL LVL3 (GOWN DISPOSABLE) ×1 IMPLANT
GOWN STRL REUS W/TWL LRG LVL3 (GOWN DISPOSABLE) ×2
GOWN STRL REUS W/TWL XL LVL3 (GOWN DISPOSABLE) ×2
IMMOBILIZER SHDR MD LX WHT (SOFTGOODS) IMPLANT
IMMOBILIZER SHDR XL LX WHT (SOFTGOODS) ×3 IMPLANT
INTRO PACEMAKR LEAD 9FR 13CM (INTRODUCER) ×3
INTRO PACEMKR SHEATH II 7FR (MISCELLANEOUS) ×6
INTRODUCER PACEMKR LD 9FR 13CM (INTRODUCER) ×1 IMPLANT
INTRODUCER PACEMKR SHTH II 7FR (MISCELLANEOUS) ×2 IMPLANT
IPG PACE AZUR XT DR MRI W1DR01 (Pacemaker) ×1 IMPLANT
IV NS 500ML (IV SOLUTION) ×2
IV NS 500ML BAXH (IV SOLUTION) ×1 IMPLANT
KIT RM TURNOVER STRD PROC AR (KITS) ×3 IMPLANT
LABEL OR SOLS (LABEL) ×3 IMPLANT
LEAD CAPSURE NOVUS 5076-52CM (Lead) ×3 IMPLANT
LEAD CAPSURE NOVUS 5076-58CM (Lead) ×3 IMPLANT
MARKER SKIN DUAL TIP RULER LAB (MISCELLANEOUS) ×3 IMPLANT
PACE AZURE XT DR MRI W1DR01 (Pacemaker) ×3 IMPLANT
PACK PACE INSERTION (MISCELLANEOUS) ×3 IMPLANT
PAD ONESTEP ZOLL R SERIES ADT (MISCELLANEOUS) ×3 IMPLANT
SUT SILK 0 SH 30 (SUTURE) ×9 IMPLANT
TOWEL OR 17X26 4PK STRL BLUE (TOWEL DISPOSABLE) ×3 IMPLANT

## 2016-10-15 NOTE — H&P (Signed)
<6>42349-1<7>Reason for Visit<6>29299-5<7>Encounter Details<6>46240-8<7>Social History<6>29762-2<7>Last Filed Vital Signs<6>8716-3<7>Progress Notes<6>10164-2<7>Plan of Treatment<6>18776-5<7>Goals<6>61146-7<7>Procedures<6>47519-4<7>Miscellaneous Results<6>59776-5<7>Visit Diagnoses<6>51848-0<7>Orders<6>77597-3<7>/"> Jump to Section ? Document InformationEncounter DetailsGoalsLast Filed Vital SignsMiscellaneous ResultsOrdersPatient DemographicsPlan of TreatmentProceduresProgress NotesReason for ReferralReason for VisitSocial HistoryVisit Diagnoses Joshua Frye Encounter Summary, generated on Aug. 13, 2018 Printout Information  Document Contents Office Visit Document Received Date Aug. 13, 2018 [place]   Patient Demographics - 81 y.o. Male, born 06/06/1935   Patient Address Communication Language Race / Ethnicity  [address] [place], [place] [address] [phone] [facility]) [phone] (Work) [phone] [facility]) [email or web address] English (Preferred) [email or web address] / Not Hispanic or [place]  Reason for Referral   Procedure (Routine) Procedure (Routine)  Status Reason Specialty Diagnoses / Procedures Referred By Contact Referred To Contact  Authorized   Diagnoses  Bradycardia    Procedures  24/48 Hr Holter Monitor-Interpretation  06-23-1996, MD  [place]  [place], [place]  Phone: [phone]  Fax: [phone]         Procedure (Routine) Procedure (Routine)  Status Reason Specialty Diagnoses / Procedures Referred By Contact Referred To Contact  Authorized   Diagnoses  Bradycardia    Procedures  24/48 Hr Holter Monitor-Scan/Analysis ([facility])  Fath, 06-23-1996, MD  [place]  [place], [place]  Phone: [phone]  Fax: [phone]         Procedure (Routine) Procedure  (Routine)  Status Reason Specialty Diagnoses / Procedures Referred By Contact Referred To Contact  Authorized   Diagnoses  Bradycardia    Procedures  24 Hr Holter Monitor  [phone], MD  [place]  [place], [place]  Phone: [phone]  Fax: [phone]        Reason for Visit    Reason Comments  Bradycardia per miller heartrate in the 30's needs pacemaker    Consultation (Urgent) Consultation (Urgent)  Status Reason Specialty Diagnoses / Procedures Referred By Contact Referred To Contact  Authorized  Cardiovascular Disease / Cardiology Diagnoses  Bradycardia   [phone], MD  [place]  [facility] [place], [place] [address]  Phone: [phone]  Fax: [phone]  [facility] - Cardiology  [place], [place] [address]  Phone: [phone]  Fax: [phone]      Encounter Details    Date Type Department Care Team Description  10/02/2016 Office Visit [facility]  [place], [place] [address]  [phone]  [phone], MD  [place]  [place], [place]  [phone]  [phone] (Fax)  Bradycardia (Primary Dx);  Essential hypertension, benign;  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 (CMS-HCC);  Hyperlipidemia, mixed;  Asymptomatic PVCs   Social History - as of this encounter   Tobacco Use Types Packs/Day Years Used Date  Never Smoker      Smokeless Tobacco: Never Used      Comments: No smoking   Alcohol Use Drinks/Week oz/Week Comments  No      Sex Assigned at Birth Date Recorded  Not on file    Last Filed Vital Signs - in this encounter   Vital Sign Reading Time Taken  Blood Pressure 140/64 10/02/2016 2:03 PM EDT    Pulse 40 10/02/2016 2:03 PM EDT  Temperature - -  Respiratory Rate 12 10/02/2016 2:03 PM EDT  Oxygen Saturation - -  Inhaled Oxygen Concentration - -  Weight 83.9 kg (185 lb) 10/02/2016 2:03 PM EDT  Height 170.2 cm (5\' 7" ) 10/02/2016 2:03 PM EDT  Body Mass Index 28.98 10/02/2016 2:03 PM EDT   Progress Notes - in this encounter   Sydnee Levans, MD - 10/02/2016 2:00 PM EDT Formatting of this note may be different from the original.   Chief Complaint: Chief Complaint  Patient presents with  . Bradycardia  per miller heartrate in the 30's needs pacemaker  Date of Service: 10/02/2016 Date of Birth: [birth year] PCP: [name], MD  History of Present Illness: Joshua Frye is a 81 y.o.male patient who returns for follow-up visit. Has a history of coronary artery disease, hyperlipidemia and hypertension. He is status post Coronary artery bypass grafting. Patient had an echocardiogram done in January of this year which was read as showing normal LV function with no regional wall motion abnormality. There was no significant valvular abnormalities other than mild MR or AI and TR. He did an ETT where there were a lot of PVCs and PACs. Patient underwent sestamibi study which revealed no reversible ischemia. Holter monitor revealed sinus rhythm sinus bradycardia with frequent PACs and occasional PVCs. There were no sustained arrhythmias. He is doing fairly well despite relative bradycardia. He has no syncope or presyncope. His most recent evaluation has shown heart rate in the upper 30s to lower 40s. He is now becoming somewhat symptomatic. He is referred for consideration for permanent pacemaker. Will need to place a Holter monitor determine if there is evidence of atrial fibrillation or sinus rhythm to guide whether or not they will or single-chamber device is indicated. Past Medical and Surgical History  Past Medical History Past Medical History:  Diagnosis Date  . BPH (benign prostatic  hyperplasia)  . Chronic obstructive asthma, unspecified (CMS-HCC)  . 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 Cardiac cath 08/22/03.  . Coronary atherosclerosis of native coronary artery  . Essential hypertension, benign  . Other and unspecified hyperlipidemia  . Sleep apnea  on CPAP   Past Surgical History He has a past surgical history that includes Cardiac catheterization ( 08/22/03); Appendectomy; other surgery (1999); and Coronary artery bypass graft (1987).   Medications and Allergies  Current Medications  Current Outpatient Prescriptions  Medication Sig Dispense Refill  . amLODIPine (NORVASC) 10 MG tablet TAKE ONE (1) TABLET EACH DAY 90 tablet 2  . ascorbic acid (VITAMIN C) 1000 MG tablet Take 1,000 mg by mouth once daily.   [name] aspirin (ASPIRIN LOW DOSE) 81 MG EC tablet Take 81 mg by mouth once daily.  [name] atorvastatin (LIPITOR) 20 MG tablet Take 1 tablet (20 mg total) by mouth once daily. 90 tablet 1  . beclomethasone (QVAR) 80 mcg/actuation inhaler Inhale 1 inhalation into the lungs 2 (two) times daily. 1 Inhaler 12  . budesonide (PULMICORT FLEXHALER) 180 mcg/actuation inhaler Inhale 1 inhalation into the lungs once daily. 1 each 5  . cyanocobalamin (VITAMIN B12) 1000 MCG tablet Take 1,000 mcg by mouth once daily.  . fluticasone (FLONASE) 50 mcg/actuation nasal spray USE TWO SPRAYS IN EACH NOSTRIL EVERY DAY 48 g 3  . inhalational spacer (AEROCHAMBER) spacer Use as instructed. 1 each 2  . isosorbide mononitrate (IMDUR) 30 MG ER tablet Take 1 tablet (30 mg total) by mouth once daily. 90 tablet 2  . montelukast (SINGULAIR) 10 mg tablet Take 1  tablet (10 mg total) by mouth nightly.  . multivitamin tablet Take 1 tablet by mouth once daily.  . nitroGLYcerin (NITROSTAT) 0.4 MG SL tablet 1 tablet under tongue as needed [1 tab under tongue every 5 mins for chest pain, if 3rd tab needed take then call 911.] 25 tablet 2  . omeprazole (PRILOSEC) 20 MG DR  capsule Take 1 capsule (20 mg total) by mouth once daily. 90 capsule 1  . saw palmetto 160 MG capsule Take 160 mg by mouth 2 (two) times daily.  . tamsulosin (FLOMAX) 0.4 mg capsule Take 0.4 mg by mouth once daily.    No current facility-administered medications for this visit.   Allergies: Procardia [nifedipine]  Social and Family History  Social History reports that he has never smoked. He has never used smokeless tobacco. He reports that he does not drink alcohol or use drugs.  Family History Family History  Problem Relation Age of Onset  . Throat cancer Father  deceased age 21, throat cancer  . Heart disease Mother   Review of Systems  Review of Systems  Constitutional: Negative for chills, diaphoresis, fever, malaise/fatigue and weight loss.  HENT: Negative for congestion, ear discharge, hearing loss and tinnitus.  Eyes: Negative for blurred vision.  Respiratory: Negative for cough, hemoptysis, sputum production, shortness of breath and wheezing.  Cardiovascular: Negative for chest pain, palpitations, orthopnea, claudication, leg swelling and PND.  Gastrointestinal: Negative for abdominal pain, blood in stool, constipation, diarrhea, heartburn, melena, nausea and vomiting.  Genitourinary: Negative for dysuria, frequency, hematuria and urgency.  Musculoskeletal: Negative for back pain, falls, joint pain and myalgias.  Skin: Negative for itching and rash.  Neurological: Negative for dizziness, tingling, focal weakness, loss of consciousness, weakness and headaches.  Endo/Heme/Allergies: Negative for polydipsia. Does not bruise/bleed easily.  Psychiatric/Behavioral: Negative for depression, memory loss and substance abuse. The patient is not nervous/anxious.    Physical Examination   Vitals: BP 140/64  Pulse (!) 40  Resp 12  Ht 170.2 cm (5\' 7" )  Wt 83.9 kg (185 lb)  BMI 28.98 kg/m  Ht:170.2 cm (5\' 7" ) Wt:83.9 kg (185 lb) ZDG:UYQI surface area is 1.99 meters  squared. Body mass index is 28.98 kg/m.  Wt Readings from Last 3 Encounters:  10/02/16 83.9 kg (185 lb)  10/02/16 84.1 kg (185 lb 6.4 oz)  09/22/16 84.8 kg (187 lb)   BP Readings from Last 3 Encounters:  10/02/16 140/64  10/02/16 150/62  09/22/16 162/64   General: Caucasian male in no acute distress LUNGS Breath Sounds: Normal Percussion: Normal  CARDIOVASCULAR JVP CV wave: no HJR: no Elevation at 90 degrees: None Carotid Pulse: normal pulsation bilaterally Bruit: None Apex: apical impulse normal  Auscultation Rhythm: normal sinus rhythm S1: normal S2: normal Clicks: no Rub: no Murmurs: no murmurs  Gallop: None ABDOMEN Liver enlargement: no Pulsatile aorta: no Ascites: no Bruits: no  EXTREMITIES Clubbing: no Edema: trace to 1+ bilateral pedal edema Pulses: peripheral pulses symmetrical Femoral Bruits: no Amputation: no SKIN Rash: no Cyanosis: no Embolic phemonenon: no Bruising: no NEURO Alert and Oriented to person, place and time: yes Non focal: yes    Assessment and Plan   81 y.o. male with  ICD-10-CM ICD-9-CM  1. 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 Remains on aspirin atorvastatin and long-acting nitrates. No ischemia on functional study. I25.718 414.02  413.9  2. Atherosclerosis of native coronary artery of native heart without angina pectoris-currently stable I25.10 414.01  3. Essential hypertension, benign-blood pressure is currently controlled with amlodipine and nitrates. Will continue with this and dash diet and follow. I10 401.1  4. Sleep apnea with use of continuous positive airway pressure (CPAP)-continue with CPAP in weight loss. G47.33 327.23  5. Bradycardia-appears to may be developing symptoms. He has difficult to assess P waves. Will place a Holter monitor determine whether or not there is any atrial fibrillation or sinus rhythm and proceed  with permanent pacemaker either dual-chamber or single-chamber.  Return in about 4 weeks (around 10/30/2016).  These notes generated with voice recognition software. I apologize for typographical errors.  Sydnee Levans, MD      Plan of Treatment - as of this encounter   Upcoming Encounters Upcoming Encounters  Date Type Specialty Care Team Description  10/16/2016 Office Visit Cardiology Fath, Aloha Gell, MD  [place]  [place], [place] [address]  [phone]  [phone] (Fax)    12/19/2016 Ancillary Orders Lab Yevonne Pax, MD  [place]  [facility]  [place], [place] [address]  [phone]  [phone] (Fax)    12/26/2016 Office Visit Internal Medicine Yevonne Pax, MD  [place]  [facility]  [place], [place] [address]  [phone]  [phone] ([phone]    06/17/2017 Office Visit Pulmonology Hulan Fray, MD  [place]  [place], [place] [address]  [phone]  [phone] (Fax)    Josem Kaufmann, NP  [place]  [place], [place] [address]  [phone]  [phone] (Fax)     Scheduled Tests Scheduled Tests  Name Priority Associated Diagnoses Order Schedule  24/48 Hr Holter Monitor-Interpretation Routine Bradycardia  Expected: 11/01/2016 (Approximate), Expires: 24/10/7351  Basic Metabolic Panel (BMP) Routine Bradycardia  Ordered: 10/02/2016  CBC w/auto Differential (5 Part) Routine Bradycardia  Ordered: 10/02/2016   Goals - as of this encounter   Patient Goal Type Goal Recent Progress Patient-Stated? Author  Action Plan Achieve & Maintain Medication Adherence  Take meds correctly and daily.    Yes Berta Minor, RN   Procedures - in this encounter   Procedure Name Priority Date/Time Associated Diagnosis Comments  [place], [facility] AFFILIATE  Routine 10/06/2016 4:50 PM EDT Bradycardia  Results for this procedure are in the results section.    Miscellaneous Results - in this encounter    24 Hr Holter Monitor 24 Hr Holter Monitor  Narrative Performed At  Holter Results    Date of Study: October 02, 2016  Indication: Bradycardia    Hours recorded: 24  Total Beats: 53,726  Heart Rate:   Average: 39 bpm  Minimum: 21 bpm at 6:32 AM on day 1  Maximum: 88 bpm at 7:29 PM on day 1  Pauses > 2 seconds: 13,297  Longest RR interval: 3.0 sec at 6:53 AM on day 1 .    Ventricular Ectopy:Supraventricular Ectopy  Total: 5588Total: 0  Isolated: 4666Isolated: 0  Couplets: 422Pairs: 0  Runs: 25Runs: 0  Longest: 6 beats at 7:29 PM on day 1Longest: N/A  Fastest: 149 bpm at 10:53 PM on day 1Fastest: N/A    Summary:  1.Atrial fibrillation with slow ventricular response with frequent pauses up to 3 seconds.      Sydnee Levans, MD         Visit Diagnoses    Diagnosis  Bradycardia - Primary  Other specified cardiac dysrhythmias   Essential hypertension, benign  Atherosclerosis of autologous vein  coronary artery bypass graft with other forms of angina pectoris (CMS-HCC)  Hyperlipidemia, mixed  Mixed hyperlipidemia   Asymptomatic PVCs   Orders - in this encounter  Cardiac Services Count Last Ordered Date First Ordered Date  24/48 HR HOLTER MONITOR-SCAN/ANALYSIS, [facility] [employer]  10/02/2016    Images Document Information   Primary Care Provider Yevonne Pax MD (Apr. 12, 2016 - Present) [phone] (Work) [phone] (Fax) [place] [facility] [place], [place] [address]  Other Service Providers Erby Pian MD (Co-Managing Provider) [phone] (Work) [phone] (Fax) [place], [place] [address]   Sydnee Levans MD (Consulting Provider) [phone] (Work) [phone]  (Fax) [place], [place] [address]   Document Coverage Dates Aug. 09, 2018  [place], [place] [address]   Encounter Providers Sydnee Levans MD (Attending) [phone] (Work) [phone] (Fax) [place], [place] [address]   Encounter Date Aug. 09, 2018   Show All Sections

## 2016-10-15 NOTE — Op Note (Signed)
[facility] Cardiology   10/15/2016                     1:34 PM  PATIENT:  Joshua Frye    PRE-OPERATIVE DIAGNOSIS:  bradycardia  POST-OPERATIVE DIAGNOSIS:  Same  PROCEDURE:  INSERTION PACEMAKER  SURGEON:  Isaias Cowman, MD    ANESTHESIA:     PREOPERATIVE INDICATIONS:  Joshua Frye is a  81 y.o. male with a diagnosis of bradycardia who failed conservative measures and elected for surgical management.    The risks benefits and alternatives were discussed with the patient preoperatively including but not limited to the risks of infection, bleeding, cardiopulmonary complications, the need for revision surgery, among others, and the patient was willing to proceed.   OPERATIVE PROCEDURE: The patient was brought to the operating room the fasting state. The left pectoral region was prepped and draped in usual sterile manner. Anesthesia was obtained 1% lidocaine locally. A 6 cm incision was performed a left pectoral region. The pacemaker pocket was generated by electrocautery and blunt dissection. Access was obtained to left subclavian vein by fine needle aspiration. MRI compatible leads were positioned into the right ventricular apical septum and right atrial appendage under fluoroscopic guidance (58 cm [id number], and 52 cm [id number]). Proper thresholds were obtained the leads were sutured in place. The leads were connected to a MRI compatible dual-chamber pacemaker generator (Medtronic [id number]). The pacemaker pocket was irrigated with gentamicin solution. The pacemaker generator was positioned into the pocket and the pocket was closed with 2-0 and 4-0 Vicryl, respectively. Steri-Strips and a pressure dressing were applied.

## 2016-10-15 NOTE — Anesthesia Procedure Notes (Signed)
Performed by: Burnis Kaser       

## 2016-10-15 NOTE — Anesthesia Postprocedure Evaluation (Signed)
Anesthesia Post Note  Patient: Joshua Frye  Procedure(s) Performed: Procedure(s) (LRB): INSERTION PACEMAKER (Left)  Patient location during evaluation: PACU Anesthesia Type: General Level of consciousness: awake and alert and oriented Pain management: pain level controlled Vital Signs Assessment: post-procedure vital signs reviewed and stable Respiratory status: spontaneous breathing Cardiovascular status: blood pressure returned to baseline Anesthetic complications: no     Last Vitals:  Vitals:   10/15/16 1332 10/15/16 1349  BP: 122/76 127/81  Pulse: (!) 59 (!) 59  Resp: (!) 21 13  Temp: (!) 36.2 C   SpO2: 100% 96%    Last Pain:  Vitals:   10/15/16 1332  TempSrc: Temporal  PainSc: 0-No pain                 Hector Taft

## 2016-10-15 NOTE — Anesthesia Post-op Follow-up Note (Signed)
Anesthesia QCDR form completed.        

## 2016-10-15 NOTE — Interval H&P Note (Signed)
History and Physical Interval Note:  10/15/2016 12:04 PM  Joshua Frye  has presented today for surgery, with the diagnosis of bradycardia  The various methods of treatment have been discussed with the patient and family. After consideration of risks, benefits and other options for treatment, the patient has consented to  Procedure(s): INSERTION PACEMAKER (N/A) as a surgical intervention .  The patient's history has been reviewed, patient examined, no change in status, stable for surgery.  I have reviewed the patient's chart and labs.  Questions were answered to the patient's satisfaction.     [name] [employer]

## 2016-10-15 NOTE — Transfer of Care (Signed)
Immediate Anesthesia Transfer of Care Note  Patient: Joshua Frye  Procedure(s) Performed: Procedure(s): INSERTION PACEMAKER (Left)  Patient Location: PACU  Anesthesia Type:General  Level of Consciousness: awake, alert  and oriented  Airway & Oxygen Therapy: Patient Spontanous Breathing and Patient connected to face mask oxygen  Post-op Assessment: Report given to RN and Post -op Vital signs reviewed and stable  Post vital signs: Reviewed and stable  Last Vitals:  Vitals:   10/15/16 1104 10/15/16 1332  BP: (!) 159/65 122/76  Pulse: (!) 30 (!) 59  Resp: 14 18  Temp: 36.5 C (!) 36.2 C  SpO2: 100% 100%    Last Pain:  Vitals:   10/15/16 1332  TempSrc: Temporal  PainSc: 0-No pain         Complications: No apparent anesthesia complications

## 2016-10-15 NOTE — Anesthesia Preprocedure Evaluation (Addendum)
Anesthesia Evaluation  Patient identified by MRN, date of birth, ID band Patient awake    Reviewed: Allergy & Precautions, H&P , NPO status , Patient's Chart, lab work & pertinent test results, reviewed documented beta blocker date and time   Airway Mallampati: III  TM Distance: >3 FB Neck ROM: full    Dental  (+) Teeth Intact   Pulmonary neg pulmonary ROS, neg shortness of breath, asthma , sleep apnea and Continuous Positive Airway Pressure Ventilation , COPD,    Pulmonary exam normal        Cardiovascular hypertension, + CAD and + Past MI  negative cardio ROS Normal cardiovascular exam+ dysrhythmias Ventricular Tachycardia  Rhythm:regular Rate:Normal  Good exercise tolerance sp Fath eval and clearance.  Echo reveals good ef and no ischemia.JA   Neuro/Psych negative neurological ROS  negative psych ROS   GI/Hepatic negative GI ROS, Neg liver ROS, GERD  Medicated,  Endo/Other  negative endocrine ROS  Renal/GU negative Renal ROS  negative genitourinary   Musculoskeletal   Abdominal   Peds  Hematology negative hematology ROS (+)   Anesthesia Other Findings Past Medical History: No date: Asthma 2017: BPH (benign prostatic hypertrophy) No date: Bronchiectasis ([place]) No date: COPD (chronic obstructive pulmonary disease) (* No date: Coronary artery disease No date: Dysrhythmia     Comment: asymptomatic pvcs No date: GERD (gastroesophageal reflux disease) No date: Hyperlipidemia No date: Hypertension 1985: Myocardial infarction ([place]) No date: Sleep apnea Past Surgical History: No date: APPENDECTOMY No date: CORONARY ANGIOPLASTY     Comment: x 2 1985: CORONARY ARTERY BYPASS GRAFT BMI    Body Mass Index:  28.06 kg/m     Reproductive/Obstetrics negative OB ROS                             Anesthesia Physical  Anesthesia Plan  ASA: III  Anesthesia Plan: MAC and General   Post-op  Pain Management:    Induction: Intravenous  PONV Risk Score and Plan:   Airway Management Planned: Nasal Cannula  Additional Equipment:   Intra-op Plan:   Post-operative Plan:   Informed Consent: I have reviewed the patients History and Physical, chart, labs and discussed the procedure including the risks, benefits and alternatives for the proposed anesthesia with the patient or authorized representative who has indicated his/her understanding and acceptance.   Dental Advisory Given  Plan Discussed with: CRNA  Anesthesia Plan Comments:        Anesthesia Quick Evaluation

## 2016-10-15 NOTE — Interval H&P Note (Signed)
History and Physical Interval Note:  10/15/2016 12:04 PM  Joshua Frye  has presented today for surgery, with the diagnosis of bradycardia  The various methods of treatment have been discussed with the patient and family. After consideration of risks, benefits and other options for treatment, the patient has consented to  Procedure(s): INSERTION PACEMAKER (N/A) as a surgical intervention .  The patient's history has been reviewed, patient examined, no change in status, stable for surgery.  I have reviewed the patient's chart and labs.  Questions were answered to the patient's satisfaction.     Captain Blucher [employer]

## 2016-10-16 ENCOUNTER — Encounter: Payer: Self-pay | Admitting: Cardiology

## 2016-10-16 DIAGNOSIS — R001 Bradycardia, unspecified: Secondary | ICD-10-CM | POA: Diagnosis not present

## 2016-10-16 MED ORDER — SODIUM CHLORIDE 0.9% FLUSH
3.0000 mL | Freq: Two times a day (BID) | INTRAVENOUS | Status: DC
  Administered 2016-10-16: 3 mL via INTRAVENOUS

## 2016-10-16 MED ORDER — CEPHALEXIN 250 MG PO CAPS
250.0000 mg | ORAL_CAPSULE | Freq: Four times a day (QID) | ORAL | 0 refills | Status: AC
Start: 2016-10-16 — End: 2016-10-23

## 2016-10-16 NOTE — Care Management Obs Status (Signed)
MEDICARE OBSERVATION STATUS NOTIFICATION   Patient Details  Name: Joshua Frye MRN: [id number] Date of Birth: 1935/07/02   Medicare Observation Status Notification Given:  No < 24 hours   Katrina Stack, RN 10/16/2016, 2:38 PM

## 2016-10-16 NOTE — Discharge Summary (Signed)
Physician Discharge Summary  Patient ID: TURKI TAPANES MRN: [id number] DOB/AGE: 1935-07-04 81 y.o.  Admit date: 10/15/2016 Discharge date: 10/16/2016  Primary Discharge Diagnosis Bradycardia Secondary Discharge Diagnosis Same  Significant Diagnostic Studies: yes; chest xray negative for pneumothorax  Consults: cardiology  Hospital Course: 81 year old male with a diagnosis of bradycardia who failed conservative measures and elected for surgical management with pacemaker implantation. The patient underwent successful dual-chamber pacemaker implantation on 10/15/16, performed by Dr. Saralyn Pilar, without pre-, peri-, or postoperative complications. The patient denies pain to the incision site. He denies chest pain, palpitations, or shortness of breath.   General: Alert and oriented. Well-appearing. No acute distress. HEENT: Pupils equally reactive to light and accomodation    Neck: Supple, normal range of motion Lungs: Normal effort of breathing; clear to auscultation bilaterally; no wheezes, rales, rhonchi Heart: Regular rate and rhythm. No murmur, rub, or gallop Abdomen: soft nontender, nondistended, with normal bowel sounds Extremities: no cyanosis, clubbing, or edema Peripheral Pulses: 2+ radial bilaterally Incision site: L upper chest; incision site with intact steri strips with dried blood, no significant erythema, warmth, or active drainage. No tenderness to palpation    Discharge Exam: Blood pressure (!) 161/66, pulse 60, temperature 98.1 F (36.7 C), temperature source Oral, resp. rate 18, height 5' 7.5" (1.715 m), weight 81.9 kg (180 lb 8 oz), SpO2 98 %.    Labs:   Lab Results  Component Value Date   WBC 8.1 10/08/2016   HGB 15.0 10/08/2016   HCT 44.7 10/08/2016   MCV 88.8 10/08/2016   PLT 157 10/08/2016   No results for input(s): NA, K, CL, CO2, BUN, CREATININE, CALCIUM, PROT, BILITOT, ALKPHOS, ALT, AST, GLUCOSE in the last 168 hours.  Invalid input(s): LABALBU     Radiology: Chest xray negative for pneumothorax EKG: Ventricular-paced rhythm with occasional PVCs  FOLLOW UP PLANS AND APPOINTMENTS  Allergies as of 10/16/2016      Reactions   Procardia [nifedipine] Other (See Comments)   Other reaction(s): Unknown dizzy Gets very woozy with this medication      Medication List    TAKE these medications   amLODipine 10 MG tablet Commonly known as:  NORVASC Take 10 mg by mouth daily.   aspirin EC 81 MG tablet Take 81 mg by mouth daily.   atorvastatin 20 MG tablet Commonly known as:  LIPITOR TAKE ONE (1) TABLET BY MOUTH EVERY DAY   beclomethasone 80 MCG/ACT inhaler Commonly known as:  QVAR Inhale 2 puffs into the lungs 2 (two) times daily as needed.   budesonide 180 MCG/ACT inhaler Commonly known as:  PULMICORT Inhale 1 puff into the lungs 2 (two) times daily.   cephALEXin 250 MG capsule Commonly known as:  KEFLEX Take 1 capsule (250 mg total) by mouth 4 (four) times daily.   fluticasone 27.5 MCG/SPRAY nasal spray Commonly known as:  VERAMYST Place 2 sprays into the nose daily.   isosorbide mononitrate 30 MG 24 hr tablet Commonly known as:  IMDUR Take 30 mg by mouth daily.   montelukast 10 MG tablet Commonly known as:  SINGULAIR Take 10 mg by mouth at bedtime.   multivitamin capsule Take 1 capsule by mouth daily.   nitroGLYCERIN 0.4 MG SL tablet Commonly known as:  NITROSTAT Place 0.4 mg under the tongue every 5 (five) minutes as needed for chest pain.   omeprazole 20 MG capsule Commonly known as:  PRILOSEC TAKE ONE (1) CAPSULE EACH DAY   saw palmetto 160 MG capsule Take  320 mg by mouth 2 (two) times daily.   tamsulosin 0.4 MG Caps capsule Commonly known as:  FLOMAX Take 1 capsule (0.4 mg total) by mouth 2 (two) times daily.   vitamin C 500 MG tablet Commonly known as:  ASCORBIC ACID Take 500 mg by mouth daily.            Discharge Care Instructions        Start     Ordered   10/16/16 0000   cephALEXin (KEFLEX) 250 MG capsule  4 times daily     10/16/16 5638     Follow-up Information    Teodoro Spray, MD Follow up in 1 week(s).   Specialty:  Cardiology Contact information: [place] [address] [phone]           BRING ALL MEDICATIONS WITH YOU TO FOLLOW UP APPOINTMENTS  Time spent with patient to include physician time: 25 minutes Signed:  Clabe Seal PA-C 10/16/2016, 8:49 AM

## 2016-10-16 NOTE — Progress Notes (Signed)
Discharge instructions reviewed patient and family. Pt/family verbalized understanding. IV removed, pressure dressing applied. All questions and concerns addressed.

## 2016-10-16 NOTE — Discharge Instructions (Signed)
Pacemaker Implantation, Adult, Care After This sheet gives you information about how to care for yourself after your procedure. Your health care provider may also give you more specific instructions. If you have problems or questions, contact your health care provider. What can I expect after the procedure? After the procedure, it is common to have:  Mild pain.  Slight bruising.  Some swelling over the incision.  A slight bump over the skin where the device was placed. Sometimes, it is possible to feel the device under the skin. This is normal.  Follow these instructions at home: Medicines  Take over-the-counter and prescription medicines only as told by your health care provider.  If you were prescribed an antibiotic medicine, take it as told by your health care provider. Do not stop taking the antibiotic even if you start to feel better. Wound care  Do not remove the bandage on your chest until directed to do so by your health care provider.  After your bandage is removed, you may see pieces of tape called skin adhesive strips over the area where the cut was made (incision site). Let them fall off on their own.  Check the incision site every day to make sure it is not infected, bleeding, or starting to pull apart.  Do not use lotions or ointments near the incision site unless directed to do so.  Keep the incision area clean and dry for 2-3 days after the procedure or as directed by your health care provider. It takes several weeks for the incision site to completely heal.  Do not take baths, swim, or use a hot tub for 7-10 days or as otherwise directed by your health care provider. Activity  Do not drive or use heavy machinery while taking prescription pain medicine.  Do not drive for 24 hours if you were given a medicine to help you relax (sedative).  Check with your health care provider before you start to drive or play sports.  Avoid sudden jerking, pulling, or chopping  movements that pull your upper arm far away from your body. Avoid these movements for at least 6 weeks or as long as told by your health care provider.  Do not lift your upper arm above your shoulders for at least 6 weeks or as long as told by your health care provider. This means no tennis, golf, or swimming.  You may go back to work when your health care provider says it is okay. Pacemaker care  You may be shown how to transfer data from your pacemaker through the phone to your health care provider.  Always let all health care providers know about your pacemaker before you have any medical procedures or tests.  Wear a medical ID bracelet or necklace stating that you have a pacemaker. Carry a pacemaker ID card with you at all times.  Your pacemaker battery will last for 5-15 years. Routine checks by your health care provider will let the health care provider know when the battery is starting to run down. The pacemaker will need to be replaced when the battery starts to run down.  Do not use amateur radio equipment or electric welding torches. Other electrical devices are safe to use, including power tools, lawn mowers, and speakers. If you are unsure of whether something is safe to use, ask your health care provider.  When using your cell phone, hold it to the ear opposite the pacemaker. Do not leave your cell phone in a pocket over the pacemaker.    pacemaker.  Avoid places or objects that have a strong electric or magnetic field, including: ? [employer]. When at the airport, let officials know that you have a pacemaker. ? Power plants. ? Large electrical generators. ? Radiofrequency transmission towers, such as cell phone and radio towers. General instructions  Weigh yourself every day. If you suddenly gain weight, fluid may be building up in your body.  Keep all follow-up visits as told by your health care provider. This is important. Contact a health care provider if:  You  gain weight suddenly.  Your legs or feet swell.  It feels like your heart is fluttering or skipping beats (heart palpitations).  You have chills or a fever.  You have more redness, swelling, or pain around your incisions.  You have more fluid or blood coming from your incisions.  Your incisions feel warm to the touch.  You have pus or a bad smell coming from your incisions. Get help right away if:  You have chest pain.  You have trouble breathing or are short of breath.  You become extremely tired.  You are light-headed or you faint. This information is not intended to replace advice given to you by your health care provider. Make sure you discuss any questions you have with your health care provider. Document Released: 08/30/2004 Document Revised: 11/23/2015 Document Reviewed: 11/23/2015 Elsevier Interactive Patient Education  2018 [employer]. For 6 weeks, avoid lifting greater than 15 pounds or raising your left arm above your head. You may shower in 24 hours, but avoid direct contact with the shower head to incision site. If steri-strips get wet, pat them dry. Leave steri-strips alone.

## 2016-10-16 NOTE — Progress Notes (Signed)
Patient ambulated unit x2. NAD noted. Paced on the monitor. Will continue to monitor.

## 2016-10-20 ENCOUNTER — Encounter: Payer: Self-pay | Admitting: Cardiology

## 2016-11-13 ENCOUNTER — Encounter
Admission: RE | Admit: 2016-11-13 | Discharge: 2016-11-13 | Disposition: A | Discharge status: Home / Self Care | Payer: Medicare Other | Source: Ambulatory Visit | Attending: Cardiology | Admitting: Cardiology

## 2016-11-13 ENCOUNTER — Ambulatory Visit
Admission: RE | Admit: 2016-11-13 | Discharge: 2016-11-13 | Disposition: A | Discharge status: Home / Self Care | Payer: Medicare Other | Source: Ambulatory Visit | Attending: Cardiology | Admitting: Cardiology

## 2016-11-13 ENCOUNTER — Ambulatory Visit: Payer: Medicare Other

## 2016-11-13 DIAGNOSIS — R9431 Abnormal electrocardiogram [ECG] [EKG]: Secondary | ICD-10-CM | POA: Insufficient documentation

## 2016-11-13 DIAGNOSIS — Z950 Presence of cardiac pacemaker: Secondary | ICD-10-CM | POA: Insufficient documentation

## 2016-11-13 DIAGNOSIS — R001 Bradycardia, unspecified: Secondary | ICD-10-CM | POA: Insufficient documentation

## 2016-11-13 DIAGNOSIS — I10 Essential (primary) hypertension: Secondary | ICD-10-CM | POA: Insufficient documentation

## 2016-11-13 DIAGNOSIS — Z01818 Encounter for other preprocedural examination: Secondary | ICD-10-CM | POA: Insufficient documentation

## 2016-11-13 HISTORY — DX: Unspecified hearing loss, unspecified ear: H91.90

## 2016-11-13 HISTORY — DX: Hypothyroidism, unspecified: E03.9

## 2016-11-13 HISTORY — DX: Personal history of other diseases of the musculoskeletal system and connective tissue: Z87.39

## 2016-11-13 HISTORY — DX: Presence of cardiac pacemaker: Z95.0

## 2016-11-13 LAB — CBC
HCT: 46.5 % (ref 40.0–52.0)
HEMOGLOBIN: 15.7 g/dL (ref 13.0–18.0)
MCH: 30.3 pg (ref 26.0–34.0)
MCHC: 33.8 g/dL (ref 32.0–36.0)
MCV: 89.4 fL (ref 80.0–100.0)
Platelets: 165 10*3/uL (ref 150–440)
RBC: 5.2 MIL/uL (ref 4.40–5.90)
RDW: 14.9 % — ABNORMAL HIGH (ref 11.5–14.5)
WBC: 8.7 10*3/uL (ref 3.8–10.6)

## 2016-11-13 LAB — BASIC METABOLIC PANEL
ANION GAP: 7 (ref 5–15)
BUN: 19 mg/dL (ref 6–20)
CHLORIDE: 107 mmol/L (ref 101–111)
CO2: 27 mmol/L (ref 22–32)
Calcium: 9.5 mg/dL (ref 8.9–10.3)
Creatinine, Ser: 1.27 mg/dL — ABNORMAL HIGH (ref 0.61–1.24)
GFR calc non Af Amer: 52 mL/min — ABNORMAL LOW (ref >60)
GLUCOSE: 113 mg/dL — AB (ref 65–99)
POTASSIUM: 4.2 mmol/L (ref 3.5–5.1)
Sodium: 141 mmol/L (ref 135–145)

## 2016-11-13 LAB — APTT: aPTT: 30 seconds (ref 24–36)

## 2016-11-13 LAB — DIFFERENTIAL
Basophils Absolute: 0 10*3/uL (ref 0–0.1)
Basophils Relative: 0 %
Eosinophils Absolute: 0.3 10*3/uL (ref 0–0.7)
Eosinophils Relative: 4 %
LYMPHS ABS: 3 10*3/uL (ref 1.0–3.6)
LYMPHS PCT: 35 %
Monocytes Absolute: 0.5 10*3/uL (ref 0.2–1.0)
Monocytes Relative: 6 %
NEUTROS ABS: 4.7 10*3/uL (ref 1.4–6.5)
NEUTROS PCT: 55 %

## 2016-11-13 LAB — SURGICAL PCR SCREEN
MRSA, PCR: NEGATIVE
Staphylococcus aureus: NEGATIVE

## 2016-11-13 LAB — PROTIME-INR
INR: 1.12
PROTHROMBIN TIME: 14.3 s (ref 11.4–15.2)

## 2016-11-13 NOTE — Pre-Procedure Instructions (Signed)
EKG reviewed by Kerin Perna (anesthesia nurse ) and faxed to Dr. Saralyn Pilar office .

## 2016-11-13 NOTE — Patient Instructions (Signed)
Your procedure is scheduled on: November 19, 2016 [facility] ) Report to Same Day Surgery 2nd floor medical mall ([place] Entrance-take elevator on left to 2nd floor.  Check in with surgery information desk.) To find out your arrival time please call [phone] between 1PM - 3PM on November 18, 2016 [facility] ) Remember: Instructions that are not followed completely may result in serious medical risk, up to and including death, or upon the discretion of your surgeon and anesthesiologist your surgery may need to be rescheduled.    _x___ 1. Do not eat food after midnight the night before your procedure. You may drink clear liquids up to 2 hours before you are scheduled to arrive at the hospital for your procedure.  Do not drink clear liquids within 2 hours of your scheduled arrival to the hospital.  Clear liquids include  --Water or Apple juice without pulp  --Clear carbohydrate beverage such as ClearFast or Gatorade  --Black Coffee or Clear Tea (No milk, no creamers, do not add anything to                  the coffee or Tea Type 1 and type 2 diabetics should only drink water.  No gum chewing or hard candies.     __x__ 2. No Alcohol for 24 hours before or after surgery.   __x__3. No Smoking for 24 prior to surgery.   ____  4. Bring all medications with you on the day of surgery if instructed.    __x__ 5. Notify your doctor if there is any change in your medical condition     (cold, fever, infections).     Do not wear jewelry, make-up, hairpins, clips or nail polish.  Do not wear lotions, powders, or perfumes.   Do not shave 48 hours prior to surgery. Men may shave face and neck.  Do not bring valuables to the hospital.    [facility] is not responsible for any belongings or valuables.               Contacts, dentures or bridgework may not be worn into surgery.  Leave your suitcase in the car. After surgery it may be brought to your room.  For patients admitted to the  hospital, discharge time is determined by your treatment team                 Patients discharged the day of surgery will not be allowed to drive home.  You will need someone to drive you home and stay with you the night of your procedure.    Please read over the following fact sheets that you were given:   [facility] Preparing for Surgery and or MRSA Information   PATIENT INSTRUCTED TO CALL DR. PARASCHOS (CARDIOLOGIST OFFICE ) AND ASK WHAT MEDICATIONS TO TAKE THE MORNING OF SURGERY WITH A SIP OF WATER.  1.   2.  3.  4.  5.  6.  ____Fleets enema or Magnesium Citrate as directed.   _x___ Use CHG Soap or sage wipes as directed on instruction sheet   __X__ Use inhalers on the day of surgery and bring to hospital day of surgery (USE PULMICORT Sylvia )   ____ Stop Metformin and Janumet 2 days prior to surgery.    ____ Take 1/2 of usual insulin dose the night before surgery and none on the morning surgery.      _x___ Follow recommendations from  Cardiologist, Pulmonologist or PCP regarding          stopping Aspirin, Coumadin, Plavix ,Eliquis, Effient, or Pradaxa, and Pletal.  X____Stop Anti-inflammatories such as Advil, Aleve, Ibuprofen, Motrin, Naproxen, Naprosyn, Goodies powders or aspirin products. OK to take Tylenol    _x___ Stop supplements until after surgery.  But may continue Vitamin D, Vitamin B, and multivitamin (STOP SAW PALMETTO AND VITAMIN C NOW )      .   _X___ Bring C-Pap to the hospital.

## 2016-11-18 MED ORDER — SODIUM CHLORIDE 0.9 % IR SOLN
Freq: Once | Status: DC
  Filled 2016-11-18: qty 2

## 2016-11-18 MED ORDER — CEFAZOLIN SODIUM-DEXTROSE 1-4 GM/50ML-% IV SOLN
1.0000 g | Freq: Once | INTRAVENOUS | Status: AC
  Administered 2016-11-19: 1 g via INTRAVENOUS

## 2016-11-19 ENCOUNTER — Ambulatory Visit: Payer: Medicare Other | Admitting: Anesthesiology

## 2016-11-19 ENCOUNTER — Ambulatory Visit
Admission: RE | Admit: 2016-11-19 | Discharge: 2016-11-19 | Disposition: A | Discharge status: Home / Self Care | Payer: Medicare Other | Source: Ambulatory Visit | Attending: Cardiology | Admitting: Cardiology

## 2016-11-19 ENCOUNTER — Encounter
Admission: RE | Disposition: A | Discharge status: Home / Self Care | Payer: Self-pay | Source: Ambulatory Visit | Attending: Cardiology

## 2016-11-19 ENCOUNTER — Encounter: Payer: Self-pay | Admitting: *Deleted

## 2016-11-19 ENCOUNTER — Ambulatory Visit: Payer: Medicare Other

## 2016-11-19 DIAGNOSIS — E782 Mixed hyperlipidemia: Secondary | ICD-10-CM | POA: Insufficient documentation

## 2016-11-19 DIAGNOSIS — I495 Sick sinus syndrome: Secondary | ICD-10-CM | POA: Diagnosis not present

## 2016-11-19 DIAGNOSIS — Z7951 Long term (current) use of inhaled steroids: Secondary | ICD-10-CM | POA: Diagnosis not present

## 2016-11-19 DIAGNOSIS — I252 Old myocardial infarction: Secondary | ICD-10-CM | POA: Insufficient documentation

## 2016-11-19 DIAGNOSIS — J449 Chronic obstructive pulmonary disease, unspecified: Secondary | ICD-10-CM | POA: Insufficient documentation

## 2016-11-19 DIAGNOSIS — T82120A Displacement of cardiac electrode, initial encounter: Secondary | ICD-10-CM | POA: Insufficient documentation

## 2016-11-19 DIAGNOSIS — Z7982 Long term (current) use of aspirin: Secondary | ICD-10-CM | POA: Diagnosis not present

## 2016-11-19 DIAGNOSIS — E039 Hypothyroidism, unspecified: Secondary | ICD-10-CM | POA: Insufficient documentation

## 2016-11-19 DIAGNOSIS — I25718 Atherosclerosis of autologous vein coronary artery bypass graft(s) with other forms of angina pectoris: Secondary | ICD-10-CM | POA: Insufficient documentation

## 2016-11-19 DIAGNOSIS — K219 Gastro-esophageal reflux disease without esophagitis: Secondary | ICD-10-CM | POA: Diagnosis not present

## 2016-11-19 DIAGNOSIS — G4730 Sleep apnea, unspecified: Secondary | ICD-10-CM | POA: Insufficient documentation

## 2016-11-19 DIAGNOSIS — I10 Essential (primary) hypertension: Secondary | ICD-10-CM | POA: Insufficient documentation

## 2016-11-19 DIAGNOSIS — Y713 Surgical instruments, materials and cardiovascular devices (including sutures) associated with adverse incidents: Secondary | ICD-10-CM | POA: Diagnosis not present

## 2016-11-19 DIAGNOSIS — N400 Enlarged prostate without lower urinary tract symptoms: Secondary | ICD-10-CM | POA: Diagnosis not present

## 2016-11-19 HISTORY — PX: PACEMAKER LEAD REMOVAL: SHX5064

## 2016-11-19 SURGERY — REMOVAL, ELECTRODE LEAD, CARDIAC PACEMAKER, WITHOUT REPLACEMENT
Anesthesia: General

## 2016-11-19 MED ORDER — GENTAMICIN SULFATE 40 MG/ML IJ SOLN
INTRAMUSCULAR | Status: AC
  Filled 2016-11-19: qty 2

## 2016-11-19 MED ORDER — CEFAZOLIN SODIUM-DEXTROSE 1-4 GM/50ML-% IV SOLN
INTRAVENOUS | Status: AC
  Filled 2016-11-19: qty 50

## 2016-11-19 MED ORDER — MIDAZOLAM HCL 2 MG/2ML IJ SOLN
INTRAMUSCULAR | Status: AC
  Filled 2016-11-19: qty 2

## 2016-11-19 MED ORDER — MIDAZOLAM HCL 2 MG/2ML IJ SOLN
INTRAMUSCULAR | Status: DC | PRN
  Administered 2016-11-19: 2 mg via INTRAVENOUS

## 2016-11-19 MED ORDER — PROPOFOL 500 MG/50ML IV EMUL
INTRAVENOUS | Status: AC
  Filled 2016-11-19: qty 50

## 2016-11-19 MED ORDER — ONDANSETRON HCL 4 MG/2ML IJ SOLN: 4.0000 mg | Freq: Once | INTRAMUSCULAR | Status: DC | PRN

## 2016-11-19 MED ORDER — CEPHALEXIN 250 MG PO CAPS: 250.0000 mg | ORAL_CAPSULE | Freq: Four times a day (QID) | ORAL | 0 refills | Status: DC

## 2016-11-19 MED ORDER — SODIUM CHLORIDE 0.9 % IR SOLN
Status: DC | PRN
  Administered 2016-11-19: 50 mL

## 2016-11-19 MED ORDER — PROPOFOL 500 MG/50ML IV EMUL
INTRAVENOUS | Status: DC | PRN
  Administered 2016-11-19: 80 ug/kg/min via INTRAVENOUS

## 2016-11-19 MED ORDER — FENTANYL CITRATE (PF) 100 MCG/2ML IJ SOLN: 25.0000 ug | INTRAMUSCULAR | Status: DC | PRN

## 2016-11-19 MED ORDER — LACTATED RINGERS IV SOLN
INTRAVENOUS | Status: DC
  Administered 2016-11-19: 13:00:00 via INTRAVENOUS

## 2016-11-19 MED ORDER — PROPOFOL 10 MG/ML IV BOLUS
INTRAVENOUS | Status: DC | PRN
  Administered 2016-11-19: 40 mg via INTRAVENOUS

## 2016-11-19 MED ORDER — LIDOCAINE 1 % OPTIME INJ - NO CHARGE
INTRAMUSCULAR | Status: DC | PRN
  Administered 2016-11-19: 19 mL

## 2016-11-19 SURGICAL SUPPLY — 35 items
BAG DECANTER FOR FLEXI CONT (MISCELLANEOUS) IMPLANT
BLADE PHOTON ILLUMINATED (MISCELLANEOUS) ×3 IMPLANT
BRUSH SCRUB EZ  4% CHG (MISCELLANEOUS) ×2
BRUSH SCRUB EZ 4% CHG (MISCELLANEOUS) ×1 IMPLANT
CABLE SURG 12 DISP A/V CHANNEL (MISCELLANEOUS) IMPLANT
CANISTER SUCT 1200ML W/VALVE (MISCELLANEOUS) ×3 IMPLANT
CHLORAPREP W/TINT 26ML (MISCELLANEOUS) ×3 IMPLANT
COVER LIGHT HANDLE STERIS (MISCELLANEOUS) ×6 IMPLANT
COVER MAYO STAND STRL (DRAPES) ×3 IMPLANT
DRAPE C-ARM XRAY 36X54 (DRAPES) ×3 IMPLANT
DRSG TEGADERM 4X4.75 (GAUZE/BANDAGES/DRESSINGS) ×3 IMPLANT
DRSG TELFA 4X3 1S NADH ST (GAUZE/BANDAGES/DRESSINGS) ×3 IMPLANT
ELECT REM PT RETURN 9FT ADLT (ELECTROSURGICAL) ×3
ELECTRODE REM PT RTRN 9FT ADLT (ELECTROSURGICAL) ×1 IMPLANT
GLOVE BIO SURGEON STRL SZ7.5 (GLOVE) ×3 IMPLANT
GLOVE BIO SURGEON STRL SZ8 (GLOVE) ×3 IMPLANT
GOWN STRL REUS W/ TWL LRG LVL3 (GOWN DISPOSABLE) ×1 IMPLANT
GOWN STRL REUS W/ TWL XL LVL3 (GOWN DISPOSABLE) ×1 IMPLANT
GOWN STRL REUS W/TWL LRG LVL3 (GOWN DISPOSABLE) ×2
GOWN STRL REUS W/TWL XL LVL3 (GOWN DISPOSABLE) ×2
IMMOBILIZER SHDR MD LX WHT (SOFTGOODS) ×3 IMPLANT
IMMOBILIZER SHDR XL LX WHT (SOFTGOODS) IMPLANT
INTRO PACEMKR SHEATH II 7FR (MISCELLANEOUS) ×3
INTRODUCER PACEMKR SHTH II 7FR (MISCELLANEOUS) ×1 IMPLANT
IV NS 500ML (IV SOLUTION) ×2
IV NS 500ML BAXH (IV SOLUTION) ×1 IMPLANT
KIT LEAD ACCESSORY 6056 PINCH (MISCELLANEOUS) ×3 IMPLANT
KIT RM TURNOVER STRD PROC AR (KITS) ×3 IMPLANT
KIT WRENCH (KITS) ×3 IMPLANT
LABEL OR SOLS (LABEL) ×3 IMPLANT
MARKER SKIN DUAL TIP RULER LAB (MISCELLANEOUS) ×3 IMPLANT
PACEMAKER STYLET BLUE (MISCELLANEOUS) ×3 IMPLANT
PACK PACE INSERTION (MISCELLANEOUS) ×3 IMPLANT
PAD STATPAD (MISCELLANEOUS) ×3 IMPLANT
SUT SILK 0 SH 30 (SUTURE) ×9 IMPLANT

## 2016-11-19 NOTE — Discharge Instructions (Signed)
°  AMBULATORY SURGERY  DISCHARGE INSTRUCTIONS   1) The drugs that you were given will stay in your system until tomorrow so for the next 24 hours you should not:  A) Drive an automobile B) Make any legal decisions C) Drink any alcoholic beverage   2) You may resume regular meals tomorrow.  Today it is better to start with liquids and gradually work up to solid foods.  You may eat anything you prefer, but it is better to start with liquids, then soup and crackers, and gradually work up to solid foods.   3) Please notify your doctor immediately if you have any unusual bleeding, trouble breathing, redness and pain at the surgery site, drainage, fever, or pain not relieved by medication.    4) Additional Instructions:        Please contact your physician with any problems or Same Day Surgery at [phone], Monday through Friday 6 am to 4 pm, or [place] at [facility] number at [phone].  Removed on the immobilizer and have a bandage on 11/20/2016. Leave Steri-Strips on. Overweight lifting left arm above head. May shower and 11/20/2016.

## 2016-11-19 NOTE — H&P (Signed)
<6>46240-8<7>Social History<6>29762-2<7>Progress Notes<6>10164-2<7>Miscellaneous Notes<6>34109-9<7>Plan of Treatment<6>18776-5<7>Goals<6>61146-7<7>Visit Diagnoses<6>51848-0<7>/"> Jump to Section ? Document InformationEncounter DetailsGoalsMiscellaneous NotesPatient DemographicsPlan of TreatmentProgress NotesSocial HistoryVisit Diagnoses Joshua Frye Encounter Summary, generated on Sep. 20, 2018 Printout Information  Document Contents Office Visit Document Received Date Sep. 20, 2018 Document Source [place]   Patient Demographics - 81 y.o. Male, born 10/22/1935   Patient Address Communication Language Race / Ethnicity  [address] [place], [place] [address] [phone] [facility]) [phone] (Work) [phone] [facility]) [email or web address] English (Preferred) [id number] / Not Hispanic or Latino  Encounter Details    Date Type Department Care Team Description  11/11/2016 Office Visit [facility]  [place], [place] [address]  [phone]  [phone], MD  [place]  [facility] [place]  [place], [place]  [phone]  [phone] (Fax)  Pacemaker lead malfunction, subsequent encounter (Primary Dx);  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   Chief Complaint: No chief complaint on  file. Date of Service: 11/11/2016 Date of Birth: 1935/08/02 PCP: 20/03/1935, MD  History of Present Illness: Joshua Frye is a 81 y.o.male patient who returns for follow-up visit. Has a history of coronary artery disease, hyperlipidemia and hypertension. He is status post Coronary artery bypass grafting. Patient had an echocardiogram done in January of this year which was read as showing normal LV function with no regional wall motion abnormality. There was no significant valvular abnormalities other than mild MR or AI and TR. He did an ETT where there were a lot of PVCs and PACs. Patient underwent sestamibi study which revealed no reversible ischemia. Patient developed symptomatic bradycardia and underwent placement of a permanent pacemaker. Procedure went well however interrogation of device last week revealed a rapid rise in the threshold of the ventricular lead. Will need lead revision. Device is capturing normally now however the longevity of the device is decreased to 4 years currently. Past Medical and Surgical History  Past Medical History Past Medical History:  Diagnosis Date  . Acquired hypothyroidism, unspecified  . BPH (benign prostatic hyperplasia)  . Chronic obstructive asthma, unspecified (CMS-HCC)  . Chronic obstructive pulmonary disease , unspecified (CMS-HCC)  . 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 Cardiac cath 08/22/03.  . Coronary atherosclerosis of native coronary artery  . Essential hypertension, benign  . GERD (gastroesophageal reflux disease)  . Other and unspecified hyperlipidemia  . Sleep apnea  on CPAP   Past Surgical History He has a past surgical history that includes Cardiac catheterization ( 08/22/03); Appendectomy; other surgery (1999); Coronary artery bypass graft (1987); Insert / replace / remove pacemaker; and Coronary angioplasty.   Medications and Allergies  Current Medications  Current Outpatient  Prescriptions  Medication Sig Dispense Refill  . amLODIPine (NORVASC) 10 MG tablet TAKE ONE (1) TABLET  EACH DAY 90 tablet 2  . ascorbic acid (VITAMIN C) 1000 MG tablet Take 1,000 mg by mouth once daily.   [name] aspirin (ASPIRIN LOW DOSE) 81 MG EC tablet Take 81 mg by mouth once daily.  [name] atorvastatin (LIPITOR) 20 MG tablet Take 1 tablet (20 mg total) by mouth once daily. 90 tablet 1  . beclomethasone (QVAR) 80 mcg/actuation inhaler Inhale 1 inhalation into the lungs 2 (two) times daily. 1 Inhaler 12  . budesonide (PULMICORT FLEXHALER) 180 mcg/actuation inhaler Inhale 1 inhalation into the lungs once daily. 1 each 5  . cyanocobalamin (VITAMIN B12) 1000 MCG tablet Take 1,000 mcg by mouth once daily.  . fluticasone (FLONASE) 50 mcg/actuation nasal spray USE TWO SPRAYS IN EACH NOSTRIL EVERY DAY 48 g 3  . inhalational spacer (AEROCHAMBER) spacer Use as instructed. 1 each 2  . isosorbide mononitrate (IMDUR) 30 MG ER tablet Take 1 tablet (30 mg total) by mouth once daily. 90 tablet 2  . montelukast (SINGULAIR) 10 mg tablet Take 1 tablet (10 mg total) by mouth nightly.  . multivitamin tablet Take 1 tablet by mouth once daily.  . nitroGLYcerin (NITROSTAT) 0.4 MG SL tablet 1 tablet under tongue as needed [1 tab under tongue every 5 mins for chest pain, if 3rd tab needed take then call 911.] 25 tablet 2  . omeprazole (PRILOSEC) 20 MG DR capsule Take 1 capsule (20 mg total) by mouth once daily. 90 capsule 1  . saw palmetto 160 MG capsule Take 160 mg by mouth 2 (two) times daily.  . tamsulosin (FLOMAX) 0.4 mg capsule Take 0.4 mg by mouth once daily.    No current facility-administered medications for this visit.   Allergies: Procardia [nifedipine]  Social and Family History  Social History reports that he has never smoked. He has never used smokeless tobacco. He reports that he does not drink alcohol or use drugs.  Family History Family History  Problem Relation Age of Onset  . Throat cancer Father    deceased age 48, throat cancer  . Heart disease Mother  . Coronary Artery Disease (Blocked arteries around heart) Mother   Review of Systems  Review of Systems  Constitutional: Negative for chills, diaphoresis, fever, malaise/fatigue and weight loss.  HENT: Negative for congestion, ear discharge, hearing loss and tinnitus.  Eyes: Negative for blurred vision.  Respiratory: Negative for cough, hemoptysis, sputum production, shortness of breath and wheezing.  Cardiovascular: Negative for chest pain, palpitations, orthopnea, claudication, leg swelling and PND.  Gastrointestinal: Negative for abdominal pain, blood in stool, constipation, diarrhea, heartburn, melena, nausea and vomiting.  Genitourinary: Negative for dysuria, frequency, hematuria and urgency.  Musculoskeletal: Negative for back pain, falls, joint pain and myalgias.  Skin: Negative for itching and rash.  Neurological: Negative for dizziness, tingling, focal weakness, loss of consciousness, weakness and headaches.  Endo/Heme/Allergies: Negative for polydipsia. Does not bruise/bleed easily.  Psychiatric/Behavioral: Negative for depression, memory loss and substance abuse. The patient is not nervous/anxious.    Physical Examination   Vitals: There were no vitals taken for this visit. Ht: Wt: ENI:DPOEU is no height or weight on file to calculate BSA. There is no height or weight on file to calculate BMI.  Wt Readings from Last 3 Encounters:  10/23/16 81.1 kg (178 lb 12.8 oz)  10/02/16 83.9 kg (185 lb)  10/02/16 84.1 kg (185 lb 6.4 oz)   BP Readings from Last 3 Encounters:  10/23/16 120/78  10/02/16 140/64  10/02/16 150/62   General: Caucasian male in  no acute distress LUNGS Breath Sounds: Normal Percussion: Normal  CARDIOVASCULAR JVP CV wave: no HJR: no Elevation at 90 degrees: None Carotid Pulse: normal pulsation bilaterally Bruit: None Apex: apical impulse normal  Auscultation Rhythm: atrial fibrillation  and normal pacemaker rhythm S1: normal S2: normal Clicks: no Rub: no Murmurs: no murmurs  Gallop: None ABDOMEN Liver enlargement: no Pulsatile aorta: no Ascites: no Bruits: no  EXTREMITIES Clubbing: no Edema: trace to 1+ bilateral pedal edema Pulses: peripheral pulses symmetrical Femoral Bruits: no Amputation: no SKIN Rash: no Cyanosis: no Embolic phemonenon: no Bruising: no NEURO Alert and Oriented to person, place and time: yes Non focal: yes    Assessment and Plan   81 y.o. male with  ICD-10-CM ICD-9-CM  1. 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 Remains on aspirin atorvastatin and long-acting nitrates. No ischemia on functional study. I25.718 414.02  413.9  2. Atherosclerosis of native coronary artery of native heart without angina pectoris-currently stable I25.10 414.01  3. Essential hypertension, benign-blood pressure is currently controlled with amlodipine and nitrates. Will continue with this and dash diet and follow. I10 401.1  4. Sleep apnea with use of continuous positive airway pressure (CPAP)-continue with CPAP in weight loss. G47.33 327.23  5. Bradycardia-ventricular pacing at present. Threshold is increased. Will need consideration for lead revision. This will be scheduled.  Return in about 2 weeks (around 11/25/2016).  These notes generated with voice recognition software. I apologize for typographical errors.  Sydnee Levans, MD      Miscellaneous Notes - in this encounter   Assessment & Plan Note - Sydnee Levans, MD - 11/12/2016 10:24 AM EDT Associated Problem(s): Sick sinus syndrome (CMS-HCC)  Status post dual-chamber permanent pacemaker October 15, 2016. Medtronic device    Plan of Treatment - as of this encounter   Upcoming Encounters Upcoming Encounters  Date Type Specialty Care Team Description  12/19/2016 Ancillary Orders Lab  Yevonne Pax, MD  [place]  [facility] [place], [place] [address]  [phone]  [phone] (Fax)    12/26/2016 Office Visit Internal Medicine Yevonne Pax, MD  [place]  [facility] [place]  [place], [place] [address]  [phone]  [phone] ([phone]    01/27/2017 Office Visit Cardiology Fath, Aloha Gell, MD  [place]  [place], [place] [address]  [phone]  [phone] (Fax)    06/17/2017 Office Visit Pulmonology Hulan Fray, MD  [place]  [place], [place] [address]  [phone]  [phone] (Fax)    Josem Kaufmann, NP  [place]  [place], [place] [address]  [phone]  [phone] (Fax)     Scheduled Tests Scheduled Tests  Name Priority Associated Diagnoses Order Schedule  X-ray chest PA and lateral Routine Pacemaker lead malfunction, subsequent encounter  1 Occurrences starting 11/11/2016 until 11/12/2017   Goals - as of this encounter   Patient Goal Type Goal Recent Progress Patient-Stated? Author  Action Plan Achieve & Maintain Medication Adherence  Take meds correctly and daily.    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 12, 2016 - Present) [phone] (Work) [phone] (Fax) [place] [facility] [place], [place] [address]  Other Service Providers Erby Pian MD (Co-Managing Provider) [phone] (Work) [phone]  (Fax) [place], [place] [address]   Sydnee Levans MD (Consulting Provider) [phone] (Work) [phone] (Fax) [place], [place] [address]   Document Coverage Dates Sep. 18, 2018  [place] [address] [place], [place] [address]   Encounter Providers Sydnee Levans MD (Attending) [phone] (Work) [phone] (Fax) [place], [place] [address]   Encounter Date Sep. 18, 2018   Show All Sections

## 2016-11-19 NOTE — Op Note (Signed)
[facility] Cardiology   11/19/2016                     2:37 PM  PATIENT:  Joshua Frye    PRE-OPERATIVE DIAGNOSIS:  Sick Sinus Syndrome  POST-OPERATIVE DIAGNOSIS:  Same  PROCEDURE:  LEAD REVISION  SURGEON:  Isaias Cowman, MD    ANESTHESIA:     PREOPERATIVE INDICATIONS:  Joshua Frye is a  81 y.o. male with a diagnosis of Sick Sinus Syndrome who failed conservative measures and elected for surgical management.    The risks benefits and alternatives were discussed with the patient preoperatively including but not limited to the risks of infection, bleeding, cardiopulmonary complications, the need for revision surgery, among others, and the patient was willing to proceed.   OPERATIVE PROCEDURE: The patient was brought to the operating room the fasting state. Left pectoral region was prepped and draped in usual sterile manner. Anesthesia was obtained 1% lidocaine locally. A 6 cm incision was performed over the left pectoral region. The existing pacemaker generator was retrieved by electrocautery and blunt dissection. The right ventricular lead ( Medtronic [id number] CapsureFix, [id number] ) was disconnected to the pacemaker generator, and repositioned into the right ventricular apical septum. After proper thresholds were obtained the lead was sutured in place. The lead was connected to the dual-chamber rate responsive pacemaker generator ( Medtronic [id number], [id number] ) ). The pacemaker pocket was irrigated with gentamicin solution. The pacemaker generator is positioned into the pocket and the pocket was closed with 2-0 and 4-0 Vicryl, respectively. Steri-Strips and pressure dressing were applied.

## 2016-11-19 NOTE — Interval H&P Note (Signed)
History and Physical Interval Note:  11/19/2016 1:02 PM  Joshua Frye  has presented today for surgery, with the diagnosis of SSS  The various methods of treatment have been discussed with the patient and family. After consideration of risks, benefits and other options for treatment, the patient has consented to  Procedure(s): LEAD REVISION (N/A) as a surgical intervention .  The patient's history has been reviewed, patient examined, no change in status, stable for surgery.  I have reviewed the patient's chart and labs.  Questions were answered to the patient's satisfaction.     Trejuan Matherne [employer]

## 2016-11-19 NOTE — Anesthesia Preprocedure Evaluation (Signed)
Anesthesia Evaluation  Patient identified by MRN, date of birth, ID band Patient awake    Reviewed: Allergy & Precautions, H&P , NPO status , Patient's Chart, lab work & pertinent test results, reviewed documented beta blocker date and time   Airway Mallampati: III   Neck ROM: full    Dental  (+) Poor Dentition, Teeth Intact   Pulmonary neg pulmonary ROS, asthma , sleep apnea , COPD,    Pulmonary exam normal        Cardiovascular Exercise Tolerance: Poor hypertension, On Medications + CAD and + Past MI  negative cardio ROS Normal cardiovascular exam+ dysrhythmias + pacemaker  Rhythm:regular Rate:Normal     Neuro/Psych negative neurological ROS  negative psych ROS   GI/Hepatic negative GI ROS, Neg liver ROS, GERD  ,  Endo/Other  negative endocrine ROSHypothyroidism   Renal/GU negative Renal ROS  negative genitourinary   Musculoskeletal   Abdominal   Peds  Hematology negative hematology ROS (+)   Anesthesia Other Findings Past Medical History: No date: Anxiety No date: Asthma 2017: BPH (benign prostatic hypertrophy) No date: Bronchiectasis (HCC) No date: COPD (chronic obstructive pulmonary disease) (HCC) No date: Coronary artery disease No date: Dysrhythmia     Comment:  asymptomatic pvcs No date: GERD (gastroesophageal reflux disease) No date: History of gout     Comment:  several years ago No date: HOH (hard of hearing)     Comment:  Bilateral Hearing Aids No date: Hyperlipidemia No date: Hypertension No date: Hypothyroidism 1985: Myocardial infarction ([place]) No date: Presence of permanent cardiac pacemaker No date: Sleep apnea     Comment:  OSA--C-PAP Past Surgical History: No date: APPENDECTOMY No date: CORONARY ANGIOPLASTY     Comment:  x 2 1985: CORONARY ARTERY BYPASS GRAFT 11/20/2015: INGUINAL HERNIA REPAIR; Right     Comment:  Procedure: HERNIA REPAIR INGUINAL ADULT;  Surgeon:   [name], MD;  Location: [facility] ORS;  Service:               General;  Laterality: Right; No date: INSERT / REPLACE / REMOVE PACEMAKER 10/15/2016: PACEMAKER INSERTION; Left     Comment:  Procedure: INSERTION PACEMAKER;  Surgeon: Isaias Cowman, MD;  Location: [facility] ORS;  Service:               Cardiovascular;  Laterality: Left;   Reproductive/Obstetrics negative OB ROS                             Anesthesia Physical Anesthesia Plan  ASA: III  Anesthesia Plan: General   Post-op Pain Management:    Induction:   PONV Risk Score and Plan:   Airway Management Planned:   Additional Equipment:   Intra-op Plan:   Post-operative Plan:   Informed Consent: I have reviewed the patients History and Physical, chart, labs and discussed the procedure including the risks, benefits and alternatives for the proposed anesthesia with the patient or authorized representative who has indicated his/her understanding and acceptance.   Dental Advisory Given  Plan Discussed with: CRNA  Anesthesia Plan Comments:         Anesthesia Quick Evaluation

## 2016-11-19 NOTE — Anesthesia Post-op Follow-up Note (Signed)
Anesthesia QCDR form completed.        

## 2016-11-19 NOTE — Transfer of Care (Signed)
Immediate Anesthesia Transfer of Care Note  Patient: Joshua Frye  Procedure(s) Performed: Procedure(s): LEAD REVISION (N/A)  Patient Location: PACU  Anesthesia Type:General  Level of Consciousness: awake, oriented and sedated  Airway & Oxygen Therapy: Patient Spontanous Breathing and Patient connected to face mask oxygen  Post-op Assessment: Report given to RN and Post -op Vital signs reviewed and stable  Post vital signs: Reviewed and stable  Last Vitals:  Vitals:   11/19/16 1246 11/19/16 1300  BP:  130/82  Pulse:  91  Resp: 18   Temp:  36.4 C  SpO2:  98%    Last Pain:  Vitals:   11/19/16 1246  TempSrc: Oral         Complications: No apparent anesthesia complications

## 2016-11-20 ENCOUNTER — Encounter: Payer: Self-pay | Admitting: Cardiology

## 2016-11-20 NOTE — Anesthesia Postprocedure Evaluation (Signed)
Anesthesia Post Note  Patient: Joshua Frye  Procedure(s) Performed: Procedure(s) (LRB): LEAD REVISION (N/A)  Patient location during evaluation: PACU Anesthesia Type: General Level of consciousness: awake and alert Pain management: pain level controlled Vital Signs Assessment: post-procedure vital signs reviewed and stable Respiratory status: spontaneous breathing, nonlabored ventilation, respiratory function stable and patient connected to nasal cannula oxygen Cardiovascular status: blood pressure returned to baseline and stable Postop Assessment: no apparent nausea or vomiting Anesthetic complications: no     Last Vitals:  Vitals:   11/19/16 1539 11/19/16 1626  BP: (!) 142/98 140/66  Pulse: 65 69  Resp: 16 16  Temp: (!) 36.1 C   SpO2: 97% 98%    Last Pain:  Vitals:   11/20/16 0831  TempSrc:   PainSc: 0-No pain                 Molli Barrows

## 2016-11-26 DIAGNOSIS — I4891 Unspecified atrial fibrillation: Secondary | ICD-10-CM | POA: Insufficient documentation

## 2016-12-26 DIAGNOSIS — Z950 Presence of cardiac pacemaker: Secondary | ICD-10-CM | POA: Insufficient documentation

## 2017-01-26 ENCOUNTER — Telehealth: Payer: Self-pay | Admitting: Urology

## 2017-01-26 MED ORDER — TAMSULOSIN HCL 0.4 MG PO CAPS: 0.4000 mg | ORAL_CAPSULE | Freq: Two times a day (BID) | ORAL | 3 refills | Status: DC

## 2017-01-26 NOTE — Telephone Encounter (Signed)
Medication was refilled.

## 2017-01-26 NOTE — Telephone Encounter (Signed)
Pt stopped by office and would like a refill on Tamsulosin 0.4 mg.  He uses [facility] on [address].  He saw [place] in 4/18 and has an upcoming appt with Sto[place] in 4/19.  Please give pt a call [phone].

## 2017-05-05 ENCOUNTER — Ambulatory Visit: Payer: Medicare Other | Admitting: Anesthesiology

## 2017-05-05 ENCOUNTER — Encounter: Payer: Self-pay | Admitting: Anesthesiology

## 2017-05-05 ENCOUNTER — Encounter
Admission: RE | Disposition: A | Discharge status: Home / Self Care | Payer: Self-pay | Source: Ambulatory Visit | Attending: Unknown Physician Specialty

## 2017-05-05 ENCOUNTER — Ambulatory Visit
Admission: RE | Admit: 2017-05-05 | Discharge: 2017-05-05 | Disposition: A | Discharge status: Home / Self Care | Payer: Medicare Other | Source: Ambulatory Visit | Attending: Unknown Physician Specialty | Admitting: Unknown Physician Specialty

## 2017-05-05 DIAGNOSIS — I493 Ventricular premature depolarization: Secondary | ICD-10-CM | POA: Diagnosis not present

## 2017-05-05 DIAGNOSIS — Z79899 Other long term (current) drug therapy: Secondary | ICD-10-CM | POA: Insufficient documentation

## 2017-05-05 DIAGNOSIS — Z951 Presence of aortocoronary bypass graft: Secondary | ICD-10-CM | POA: Insufficient documentation

## 2017-05-05 DIAGNOSIS — E039 Hypothyroidism, unspecified: Secondary | ICD-10-CM | POA: Diagnosis not present

## 2017-05-05 DIAGNOSIS — Z800 Family history of malignant neoplasm of digestive organs: Secondary | ICD-10-CM | POA: Diagnosis not present

## 2017-05-05 DIAGNOSIS — D123 Benign neoplasm of transverse colon: Secondary | ICD-10-CM | POA: Diagnosis not present

## 2017-05-05 DIAGNOSIS — I2510 Atherosclerotic heart disease of native coronary artery without angina pectoris: Secondary | ICD-10-CM | POA: Diagnosis not present

## 2017-05-05 DIAGNOSIS — Z7982 Long term (current) use of aspirin: Secondary | ICD-10-CM | POA: Diagnosis not present

## 2017-05-05 DIAGNOSIS — Z888 Allergy status to other drugs, medicaments and biological substances status: Secondary | ICD-10-CM | POA: Insufficient documentation

## 2017-05-05 DIAGNOSIS — Z950 Presence of cardiac pacemaker: Secondary | ICD-10-CM | POA: Insufficient documentation

## 2017-05-05 DIAGNOSIS — N400 Enlarged prostate without lower urinary tract symptoms: Secondary | ICD-10-CM | POA: Insufficient documentation

## 2017-05-05 DIAGNOSIS — E785 Hyperlipidemia, unspecified: Secondary | ICD-10-CM | POA: Insufficient documentation

## 2017-05-05 DIAGNOSIS — J449 Chronic obstructive pulmonary disease, unspecified: Secondary | ICD-10-CM | POA: Diagnosis not present

## 2017-05-05 DIAGNOSIS — K219 Gastro-esophageal reflux disease without esophagitis: Secondary | ICD-10-CM | POA: Insufficient documentation

## 2017-05-05 DIAGNOSIS — F419 Anxiety disorder, unspecified: Secondary | ICD-10-CM | POA: Diagnosis not present

## 2017-05-05 DIAGNOSIS — Z1211 Encounter for screening for malignant neoplasm of colon: Secondary | ICD-10-CM | POA: Diagnosis not present

## 2017-05-05 DIAGNOSIS — Z7951 Long term (current) use of inhaled steroids: Secondary | ICD-10-CM | POA: Insufficient documentation

## 2017-05-05 DIAGNOSIS — I10 Essential (primary) hypertension: Secondary | ICD-10-CM | POA: Diagnosis not present

## 2017-05-05 DIAGNOSIS — Z86010 Personal history of colonic polyps: Secondary | ICD-10-CM | POA: Insufficient documentation

## 2017-05-05 DIAGNOSIS — M109 Gout, unspecified: Secondary | ICD-10-CM | POA: Insufficient documentation

## 2017-05-05 DIAGNOSIS — I4891 Unspecified atrial fibrillation: Secondary | ICD-10-CM | POA: Insufficient documentation

## 2017-05-05 DIAGNOSIS — G4733 Obstructive sleep apnea (adult) (pediatric): Secondary | ICD-10-CM | POA: Diagnosis not present

## 2017-05-05 DIAGNOSIS — Z7901 Long term (current) use of anticoagulants: Secondary | ICD-10-CM | POA: Insufficient documentation

## 2017-05-05 DIAGNOSIS — I252 Old myocardial infarction: Secondary | ICD-10-CM | POA: Insufficient documentation

## 2017-05-05 DIAGNOSIS — Z9861 Coronary angioplasty status: Secondary | ICD-10-CM | POA: Diagnosis not present

## 2017-05-05 HISTORY — PX: COLONOSCOPY WITH PROPOFOL: SHX5780

## 2017-05-05 SURGERY — COLONOSCOPY WITH PROPOFOL
Anesthesia: General

## 2017-05-05 MED ORDER — SODIUM CHLORIDE 0.9 % IV SOLN: INTRAVENOUS | Status: DC

## 2017-05-05 MED ORDER — LIDOCAINE HCL (CARDIAC) 20 MG/ML IV SOLN
INTRAVENOUS | Status: DC | PRN
  Administered 2017-05-05: 60 mg via INTRAVENOUS

## 2017-05-05 MED ORDER — SODIUM CHLORIDE 0.9 % IV SOLN
INTRAVENOUS | Status: DC
  Administered 2017-05-05: 1000 mL via INTRAVENOUS

## 2017-05-05 MED ORDER — LIDOCAINE HCL (PF) 1 % IJ SOLN
2.0000 mL | Freq: Once | INTRAMUSCULAR | Status: AC
  Administered 2017-05-05: 0.3 mL via INTRADERMAL

## 2017-05-05 MED ORDER — PROPOFOL 500 MG/50ML IV EMUL
INTRAVENOUS | Status: DC | PRN
  Administered 2017-05-05: 160 ug/kg/min via INTRAVENOUS

## 2017-05-05 MED ORDER — LIDOCAINE HCL (PF) 1 % IJ SOLN
INTRAMUSCULAR | Status: AC
  Administered 2017-05-05: 0.3 mL via INTRADERMAL
  Filled 2017-05-05: qty 2

## 2017-05-05 MED ORDER — PROPOFOL 10 MG/ML IV BOLUS
INTRAVENOUS | Status: DC | PRN
  Administered 2017-05-05: 50 mg via INTRAVENOUS

## 2017-05-05 NOTE — Anesthesia Post-op Follow-up Note (Signed)
Anesthesia QCDR form completed.        

## 2017-05-05 NOTE — Op Note (Signed)
[facility] Gastroenterology Patient Name: Joshua Frye Procedure Date: 05/05/2017 2:53 PM MRN: [id number] Account #: [id number] Date of Birth: Jun 16, 1935 Admit Type: Outpatient Age: 82 Room: [facility] ENDO ROOM 1 Gender: Male Note Status: Finalized Procedure:            Colonoscopy Indications:          High risk colon cancer surveillance: Personal history                        of colonic polyps Providers:            Manya Silvas, MD Referring MD:         [name], MD (Referring MD) Medicines:            Propofol per Anesthesia Complications:        No immediate complications. Procedure:            Pre-Anesthesia Assessment:                       - After reviewing the risks and benefits, the patient                        was deemed in satisfactory condition to undergo the                        procedure.                       After obtaining informed consent, the colonoscope was                        passed under direct vision. Throughout the procedure,                        the patient's blood pressure, pulse, and oxygen                        saturations were monitored continuously. The [place] (S#: [id number]) was introduced through                        the anus and advanced to the the cecum, identified by                        appendiceal orifice and ileocecal valve. The                        colonoscopy was performed without difficulty. The                        patient tolerated the procedure well. The quality of                        the bowel preparation was good. Findings:      A diminutive polyp was found in the hepatic flexure. The polyp was       sessile. The polyp was removed with a jumbo cold forceps. Resection and       retrieval were complete.  Two sessile polyps were found in the transverse colon. The polyps were       diminutive in size. These polyps were removed with a jumbo cold  forceps.       Resection and retrieval were complete.      A diminutive polyp was found in the splenic flexure. The polyp was       sessile. The polyp was removed with a jumbo cold forceps. Resection and       retrieval were complete. Impression:           - One diminutive polyp at the hepatic flexure, removed                        with a jumbo cold forceps. Resected and retrieved.                       - Two diminutive polyps in the transverse colon,                        removed with a jumbo cold forceps. Resected and                        retrieved.                       - One diminutive polyp at the splenic flexure, removed                        with a jumbo cold forceps. Resected and retrieved. Recommendation:       - Await pathology results. Manya Silvas, MD 05/05/2017 3:24:16 PM This report has been signed electronically. Number of Addenda: 0 Note Initiated On: 05/05/2017 2:53 PM Scope Withdrawal Time: 0 hours 10 minutes 30 seconds  Total Procedure Duration: 0 hours 20 minutes 54 seconds       [facility]

## 2017-05-05 NOTE — Anesthesia Procedure Notes (Signed)
Performed by: Alasha Mcguinness, CRNA Pre-anesthesia Checklist: Patient identified, Emergency Drugs available, Suction available, Patient being monitored and Timeout performed Patient Re-evaluated:Patient Re-evaluated prior to induction Oxygen Delivery Method: Nasal cannula Induction Type: IV induction       

## 2017-05-05 NOTE — Anesthesia Preprocedure Evaluation (Signed)
Anesthesia Evaluation  Patient identified by MRN, date of birth, ID band Patient awake    Reviewed: Allergy & Precautions, H&P , NPO status , Patient's Chart, lab work & pertinent test results  History of Anesthesia Complications Negative for: history of anesthetic complications  Airway Mallampati: III  TM Distance: <3 FB Neck ROM: limited    Dental  (+) Chipped, Poor Dentition, Partial Upper, Partial Lower, Missing   Pulmonary neg shortness of breath, asthma , sleep apnea , COPD,           Cardiovascular Exercise Tolerance: Good hypertension, (-) angina+ CAD and + Past MI  (-) DOE + dysrhythmias + pacemaker      Neuro/Psych negative neurological ROS  negative psych ROS   GI/Hepatic Neg liver ROS, GERD  Medicated and Controlled,  Endo/Other  Hypothyroidism   Renal/GU negative Renal ROS  negative genitourinary   Musculoskeletal   Abdominal   Peds  Hematology negative hematology ROS (+)   Anesthesia Other Findings Past Medical History: No date: Anxiety No date: Asthma 2017: BPH (benign prostatic hypertrophy) No date: Bronchiectasis (HCC) No date: COPD (chronic obstructive pulmonary disease) (HCC) No date: Coronary artery disease No date: Dysrhythmia     Comment:  asymptomatic pvcs No date: GERD (gastroesophageal reflux disease) No date: History of gout     Comment:  several years ago No date: HOH (hard of hearing)     Comment:  Bilateral Hearing Aids No date: Hyperlipidemia No date: Hypertension No date: Hypothyroidism 1985: Myocardial infarction ([place]) No date: Presence of permanent cardiac pacemaker No date: Sleep apnea     Comment:  OSA--C-PAP  Past Surgical History: No date: APPENDECTOMY No date: CORONARY ANGIOPLASTY     Comment:  x 2 1985: CORONARY ARTERY BYPASS GRAFT 11/20/2015: INGUINAL HERNIA REPAIR; Right     Comment:  Procedure: HERNIA REPAIR INGUINAL ADULT;  Surgeon:               [name], MD;  Location: [facility] ORS;  Service:               General;  Laterality: Right; No date: INSERT / REPLACE / REMOVE PACEMAKER 10/15/2016: PACEMAKER INSERTION; Left     Comment:  Procedure: INSERTION PACEMAKER;  Surgeon: Isaias Cowman, MD;  Location: [facility];  Service:               Cardiovascular;  Laterality: Left; 11/19/2016: PACEMAKER LEAD REMOVAL; N/A     Comment:  Procedure: LEAD REVISION;  Surgeon: Isaias Cowman, MD;  Location: [facility];  Service:               Cardiovascular;  Laterality: N/A;  BMI    Body Mass Index:  28.04 kg/m      Reproductive/Obstetrics negative OB ROS                             Anesthesia Physical Anesthesia Plan  ASA: III  Anesthesia Plan: General   Post-op Pain Management:    Induction: Intravenous  PONV Risk Score and Plan: Propofol infusion and TIVA  Airway Management Planned: Natural Airway and Nasal Cannula  Additional Equipment:   Intra-op Plan:   Post-operative Plan:   Informed Consent: I have reviewed the patients History and Physical, chart, labs and discussed the  procedure including the risks, benefits and alternatives for the proposed anesthesia with the patient or authorized representative who has indicated his/her understanding and acceptance.   Dental Advisory Given  Plan Discussed with: Anesthesiologist, CRNA and Surgeon  Anesthesia Plan Comments: (Patient consented for risks of anesthesia including but not limited to:  - adverse reactions to medications - risk of intubation if required - damage to teeth, lips or other oral mucosa - sore throat or hoarseness - Damage to heart, brain, lungs or loss of life  Patient voiced understanding.)        Anesthesia Quick Evaluation

## 2017-05-05 NOTE — Transfer of Care (Signed)
Immediate Anesthesia Transfer of Care Note  Patient: TARIQ PERNELL  Procedure(s) Performed: COLONOSCOPY WITH PROPOFOL (N/A )  Patient Location: PACU  Anesthesia Type:General  Level of Consciousness: sedated  Airway & Oxygen Therapy: Patient Spontanous Breathing and Patient connected to nasal cannula oxygen  Post-op Assessment: Report given to RN and Post -op Vital signs reviewed and stable  Post vital signs: Reviewed and stable  Last Vitals:  Vitals:   05/05/17 1419 05/05/17 1526  BP: (!) 156/91 100/72  Pulse: 63 62  Resp: 17 (!) 21  Temp: 37.1 C (!) 36.3 C  SpO2: 98% 96%    Last Pain:  Vitals:   05/05/17 1526  TempSrc: Tympanic         Complications: No apparent anesthesia complications

## 2017-05-05 NOTE — Anesthesia Postprocedure Evaluation (Signed)
Anesthesia Post Note  Patient: Joshua Frye  Procedure(s) Performed: COLONOSCOPY WITH PROPOFOL (N/A )  Patient location during evaluation: Endoscopy Anesthesia Type: General Level of consciousness: awake and alert Pain management: pain level controlled Vital Signs Assessment: post-procedure vital signs reviewed and stable Respiratory status: spontaneous breathing, nonlabored ventilation, respiratory function stable and patient connected to nasal cannula oxygen Cardiovascular status: blood pressure returned to baseline and stable Postop Assessment: no apparent nausea or vomiting Anesthetic complications: no     Last Vitals:  Vitals:   05/05/17 1538 05/05/17 1548  BP: 125/74 132/78  Pulse: (!) 41 60  Resp: 20 18  Temp:    SpO2: 95% 99%    Last Pain:  Vitals:   05/05/17 1528  TempSrc: Tympanic                 Mahonri Seiden S

## 2017-05-05 NOTE — H&P (Signed)
Primary Care Physician:  [name], MD Primary Gastroenterologist:  Dr. Vira Agar  Pre-Procedure History & Physical: HPI:  Joshua Frye is a 82 y.o. male is here for an colonoscopy for [facility] colon polyps and FH colon cancer in father.   Past Medical History:  Diagnosis Date  . Anxiety   . Asthma   . BPH (benign prostatic hypertrophy) 2017  . Bronchiectasis ([place])   . COPD (chronic obstructive pulmonary disease) ([place])   . Coronary artery disease   . Dysrhythmia    asymptomatic pvcs  . GERD (gastroesophageal reflux disease)   . History of gout    several years ago  . HOH (hard of hearing)    Bilateral Hearing Aids  . Hyperlipidemia   . Hypertension   . Hypothyroidism   . Myocardial infarction ([place]) 1985  . Presence of permanent cardiac pacemaker   . Sleep apnea    OSA--C-PAP    Past Surgical History:  Procedure Laterality Date  . APPENDECTOMY    . CORONARY ANGIOPLASTY     x 2  . CORONARY ARTERY BYPASS GRAFT  1985  . INGUINAL HERNIA REPAIR Right 11/20/2015   Procedure: HERNIA REPAIR INGUINAL ADULT;  Surgeon: [name], MD;  Location: [facility];  Service: General;  Laterality: Right;  . INSERT / REPLACE / REMOVE PACEMAKER    . PACEMAKER INSERTION Left 10/15/2016   Procedure: INSERTION PACEMAKER;  Surgeon: Isaias Cowman, MD;  Location: [facility];  Service: Cardiovascular;  Laterality: Left;  . PACEMAKER LEAD REMOVAL N/A 11/19/2016   Procedure: LEAD REVISION;  Surgeon: Isaias Cowman, MD;  Location: [facility];  Service: Cardiovascular;  Laterality: N/A;    Prior to Admission medications   Medication Sig Start Date End Date Taking? Authorizing Provider  apixaban (ELIQUIS) 5 MG TABS tablet Take 5 mg by mouth 2 (two) times daily.   Yes [provider]  amLODipine (NORVASC) 10 MG tablet Take 10 mg by mouth daily.    [provider]  aspirin EC 81 MG tablet Take 81 mg by mouth daily.     [provider]  atorvastatin (LIPITOR) 20  MG tablet TAKE ONE (1) TABLET BY MOUTH EVERY DAY 09/05/15   [provider]  budesonide (PULMICORT) 180 MCG/ACT inhaler Inhale 1 puff into the lungs daily.     [provider]  cephALEXin (KEFLEX) 250 MG capsule Take 1 capsule (250 mg total) by mouth 4 (four) times daily. 11/19/16   Paraschos, Sheppard Coil, MD  fluticasone Asencion Islam) 50 MCG/ACT nasal spray instill 2 sprays into each nostril once daily 10/06/16   [provider]  ibuprofen (ADVIL,MOTRIN) 200 MG tablet Take 400 mg by mouth every 8 (eight) hours as needed for headache or mild pain.    [provider]  isosorbide mononitrate (IMDUR) 30 MG 24 hr tablet Take 30 mg by mouth daily.  09/07/14   [provider]  montelukast (SINGULAIR) 10 MG tablet Take 10 mg by mouth daily.     [provider]  Multiple Vitamin (MULTIVITAMIN) capsule Take 1 capsule by mouth daily.    [provider]  nitroGLYCERIN (NITROSTAT) 0.4 MG SL tablet Place 0.4 mg under the tongue every 5 (five) minutes as needed for chest pain.    [provider]  omeprazole (PRILOSEC) 20 MG capsule TAKE ONE (1) CAPSULE EACH DAY 09/05/15   [provider]  Saw Palmetto 450 MG CAPS Take 450 mg by mouth 2 (two) times daily.    [provider]  tamsulosin (FLOMAX) 0.4 MG CAPS capsule Take 1 capsule (0.4 mg total) by mouth 2 (two) times daily. 01/26/17   [name], MD  vitamin C (ASCORBIC ACID) 500 MG tablet Take 500 mg by mouth daily.    [provider]    Allergies as of 05/04/2017 - Review Complete 05/04/2017  Allergen Reaction Noted  . Procardia [nifedipine] Other (See Comments) 03/01/2013    Family History  Problem Relation Age of Onset  . Diabetes Mother     Social History   Socioeconomic History  . Marital status: Married    Spouse name: Not on file  . Number of children: Not on file  . Years of education: Not on file  . Highest education level: Not on file  Social  Needs  . Financial resource strain: Not on file  . Food insecurity - worry: Not on file  . Food insecurity - inability: Not on file  . Transportation needs - medical: Not on file  . Transportation needs - non-medical: Not on file  Occupational History  . Not on file  Tobacco Use  . Smoking status: Never Smoker  . Smokeless tobacco: Never Used  Substance and Sexual Activity  . Alcohol use: No  . Drug use: No  . Sexual activity: Not on file  Other Topics Concern  . Not on file  Social History Narrative  . Not on file    Review of Systems: See HPI, otherwise negative ROS  Physical Exam: BP (!) 156/91   Pulse 63   Temp 98.8 F (37.1 C) (Tympanic)   Resp 17   Ht 5\' 7"  (1.702 m)   Wt 81.2 kg (179 lb)   SpO2 98%   BMI 28.04 kg/m  General:   Alert,  pleasant and cooperative in NAD Head:  Normocephalic and atraumatic. Neck:  Supple; no masses or thyromegaly. Lungs:  Clear throughout to auscultation.    Heart:  Regular rate and rhythm. Abdomen:  Soft, nontender and nondistended. Normal bowel sounds, without guarding, and without rebound.   Neurologic:  Alert and  oriented x4;  grossly normal neurologically.  Impression/Plan: Joshua Frye is here for an colonoscopy to be performed for [facility] colon polyps and FH colon cancer.  Risks, benefits, limitations, and alternatives regarding  colonoscopy have been reviewed with the patient.  Questions have been answered.  All parties agreeable.   Gaylyn Cheers, MD  05/05/2017, 2:45 PM

## 2017-05-06 ENCOUNTER — Encounter: Payer: Self-pay | Admitting: Unknown Physician Specialty

## 2017-05-08 LAB — SURGICAL PATHOLOGY

## 2017-06-05 ENCOUNTER — Ambulatory Visit: Payer: Medicare Other | Admitting: Urology

## 2017-06-11 ENCOUNTER — Ambulatory Visit (INDEPENDENT_AMBULATORY_CARE_PROVIDER_SITE_OTHER): Payer: Medicare Other | Admitting: Urology

## 2017-06-11 ENCOUNTER — Encounter: Payer: Self-pay | Admitting: Urology

## 2017-06-11 VITALS — BP 155/86 | HR 73 | Resp 16 | Ht 68.0 in | Wt 189.4 lb

## 2017-06-11 DIAGNOSIS — N401 Enlarged prostate with lower urinary tract symptoms: Secondary | ICD-10-CM

## 2017-06-11 LAB — MICROSCOPIC EXAMINATION
BACTERIA UA: NONE SEEN
RBC MICROSCOPIC, UA: NONE SEEN /HPF (ref 0–2)
WBC, UA: NONE SEEN /hpf (ref 0–5)

## 2017-06-11 LAB — URINALYSIS, COMPLETE
BILIRUBIN UA: NEGATIVE
Glucose, UA: NEGATIVE
KETONES UA: NEGATIVE
LEUKOCYTES UA: NEGATIVE
NITRITE UA: NEGATIVE
PH UA: 5 (ref 5.0–7.5)
Protein, UA: NEGATIVE
RBC, UA: NEGATIVE
Specific Gravity, UA: <1.005 — ABNORMAL LOW (ref 1.005–1.030)
Urobilinogen, Ur: 0.2 mg/dL (ref 0.2–1.0)

## 2017-06-11 NOTE — Progress Notes (Signed)
06/11/2017 3:17 PM   Joshua Frye 1935/11/15 [id number]  Referring provider: [name], MD [facility] [place], [place] [address]  Chief Complaint  Patient presents with  . Follow-up    HPI: 82 year old male presents for follow-up of BPH.  He was seen in September 2017 after an episode of postop retention status post hernia repair.  He was started on tamsulosin which he continues at 0.8 mg daily.  He was last seen April 2018 and his PVR by bladder scan was 25 mL.  He has no bothersome lower urinary tract symptoms.  He has nocturia x1-2.  Denies dysuria or gross hematuria.  Denies flank, abdominal, pelvic or scrotal pain.   PMH: Past Medical History:  Diagnosis Date  . Anxiety   . Asthma   . BPH (benign prostatic hypertrophy) 2017  . Bronchiectasis ([place])   . COPD (chronic obstructive pulmonary disease) ([place])   . Coronary artery disease   . Dysrhythmia    asymptomatic pvcs  . GERD (gastroesophageal reflux disease)   . History of gout    several years ago  . HOH (hard of hearing)    Bilateral Hearing Aids  . Hyperlipidemia   . Hypertension   . Hypothyroidism   . Myocardial infarction ([place]) 1985  . Presence of permanent cardiac pacemaker   . Sleep apnea    OSA--C-PAP    Surgical History: Past Surgical History:  Procedure Laterality Date  . APPENDECTOMY    . COLONOSCOPY WITH PROPOFOL N/A 05/05/2017   Procedure: COLONOSCOPY WITH PROPOFOL;  Surgeon: Manya Silvas, MD;  Location: [facility];  Service: Endoscopy;  Laterality: N/A;  . CORONARY ANGIOPLASTY     x 2  . CORONARY ARTERY BYPASS GRAFT  1985  . INGUINAL HERNIA REPAIR Right 11/20/2015   Procedure: HERNIA REPAIR INGUINAL ADULT;  Surgeon: [name], MD;  Location: [facility];  Service: General;  Laterality: Right;  . INSERT / REPLACE / REMOVE PACEMAKER    . PACEMAKER INSERTION Left 10/15/2016   Procedure: INSERTION PACEMAKER;  Surgeon: Isaias Cowman, MD;  Location: [facility];  Service: Cardiovascular;  Laterality: Left;  . PACEMAKER LEAD REMOVAL N/A 11/19/2016   Procedure: LEAD REVISION;  Surgeon: Isaias Cowman, MD;  Location: [facility];  Service: Cardiovascular;  Laterality: N/A;    Home Medications:  Allergies as of 06/11/2017      Reactions   Procardia [nifedipine] Other (See Comments)   Other reaction(s): Unknown dizzy Gets very woozy with this medication      Medication List        Accurate as of 06/11/17  3:17 PM. Always use your most recent med list.          amLODipine 10 MG tablet Commonly known as:  NORVASC Take 10 mg by mouth daily.   apixaban 5 MG Tabs tablet Commonly known as:  ELIQUIS Take 5 mg by mouth 2 (two) times daily.   aspirin EC 81 MG tablet Take 81 mg by mouth daily.   atorvastatin 20 MG tablet Commonly known as:  LIPITOR TAKE ONE (1) TABLET BY MOUTH EVERY DAY   budesonide 180 MCG/ACT inhaler Commonly known as:  PULMICORT Inhale 1 puff into the lungs daily.   fluticasone 50 MCG/ACT nasal spray Commonly known as:  FLONASE instill 2 sprays into each nostril once daily   ibuprofen 200 MG tablet Commonly known as:  ADVIL,MOTRIN Take 400 mg by mouth every 8 (eight) hours as needed for headache  or mild pain.   isosorbide mononitrate 30 MG 24 hr tablet Commonly known as:  IMDUR Take 30 mg by mouth daily.   montelukast 10 MG tablet Commonly known as:  SINGULAIR Take 10 mg by mouth daily.   multivitamin capsule Take 1 capsule by mouth daily.   nitroGLYCERIN 0.4 MG SL tablet Commonly known as:  NITROSTAT Place 0.4 mg under the tongue every 5 (five) minutes as needed for chest pain.   omeprazole 20 MG capsule Commonly known as:  PRILOSEC TAKE ONE (1) CAPSULE EACH DAY   Saw Palmetto 450 MG Caps Take 450 mg by mouth 2 (two) times daily.   tamsulosin 0.4 MG Caps capsule Commonly known as:  FLOMAX Take 1 capsule (0.4 mg total) by mouth 2 (two) times daily.   vitamin C  500 MG tablet Commonly known as:  ASCORBIC ACID Take 500 mg by mouth daily.       Allergies:  Allergies  Allergen Reactions  . Procardia [Nifedipine] Other (See Comments)    Other reaction(s): Unknown dizzy Gets very woozy with this medication    Family History: Family History  Problem Relation Age of Onset  . Diabetes Mother     Social History:  reports that he has never smoked. He has never used smokeless tobacco. He reports that he does not drink alcohol or use drugs.  ROS: UROLOGY Frequent Urination?: No Hard to postpone urination?: No Burning/pain with urination?: No Get up at night to urinate?: Yes Leakage of urine?: No Urine stream starts and stops?: No Trouble starting stream?: No Do you have to strain to urinate?: No Blood in urine?: No Urinary tract infection?: No Sexually transmitted disease?: No Injury to kidneys or bladder?: No Painful intercourse?: No Weak stream?: No Erection problems?: No Penile pain?: No  Gastrointestinal Nausea?: No Vomiting?: No Indigestion/heartburn?: No Diarrhea?: No Constipation?: No  Constitutional Fever: No Night sweats?: No Weight loss?: No Fatigue?: No  Skin Skin rash/lesions?: No Itching?: No  Eyes Blurred vision?: No Double vision?: No  Ears/Nose/Throat Sore throat?: No Sinus problems?: No  Hematologic/Lymphatic Swollen glands?: No Easy bruising?: No  Cardiovascular Leg swelling?: No Chest pain?: No  Respiratory Cough?: No Shortness of breath?: No  Endocrine Excessive thirst?: No  Musculoskeletal Back pain?: No Joint pain?: No  Neurological Headaches?: No Dizziness?: No  Psychologic Depression?: No Anxiety?: No  Physical Exam: BP (!) 155/86   Pulse 73   Resp 16   Ht 5\' 8"  (1.727 m)   Wt 189 lb 6.4 oz (85.9 kg)   SpO2 98%   BMI 28.80 kg/m   Constitutional:  Alert and oriented, No acute distress. HEENT: [place] AT, moist mucus membranes.  Trachea midline, no  masses. Cardiovascular: No clubbing, cyanosis, or edema. Respiratory: Normal respiratory effort, no increased work of breathing. GI: Abdomen is soft, nontender, nondistended, no abdominal masses GU: No CVA tenderness Lymph: No cervical or inguinal lymphadenopathy. Skin: No rashes, bruises or suspicious lesions. Neurologic: Grossly intact, no focal deficits, moving all 4 extremities. Psychiatric: Normal mood and affect.  Laboratory Data: Lab Results  Component Value Date   WBC 8.7 11/13/2016   HGB 15.7 11/13/2016   HCT 46.5 11/13/2016   MCV 89.4 11/13/2016   PLT 165 11/13/2016    Lab Results  Component Value Date   CREATININE 1.27 (H) 11/13/2016     Urinalysis Dipstick/microscopy negative   Assessment & Plan:   82 year old male with BPH and stable lower urinary tract symptoms.  He did not need a tamsulosin  refill at this time.  Recommend he continue the alpha-blocker.  Continue annual follow-up.   Return in about 1 year (around 06/12/2018) for Recheck, Bladder scan.  Abbie Sons, [place] [address], [place], [place] [address] [phone]

## 2017-06-14 ENCOUNTER — Encounter: Payer: Self-pay | Admitting: Urology

## 2017-06-29 DIAGNOSIS — D369 Benign neoplasm, unspecified site: Secondary | ICD-10-CM | POA: Insufficient documentation

## 2017-06-29 DIAGNOSIS — Z0000 Encounter for general adult medical examination without abnormal findings: Secondary | ICD-10-CM | POA: Insufficient documentation

## 2017-12-02 IMAGING — CR DG CHEST 2V
1 series · 2 of 2 positions shown · non-contrast
Comparison: 10/15/2016.

CLINICAL DATA: Preop.  Hypertension.  Pacemaker.

EXAM:
CHEST  2 VIEW

[Series 1: dg chest 2 view · 0.14mm/px · 2 of 2 slices shown]
[im 1/2]
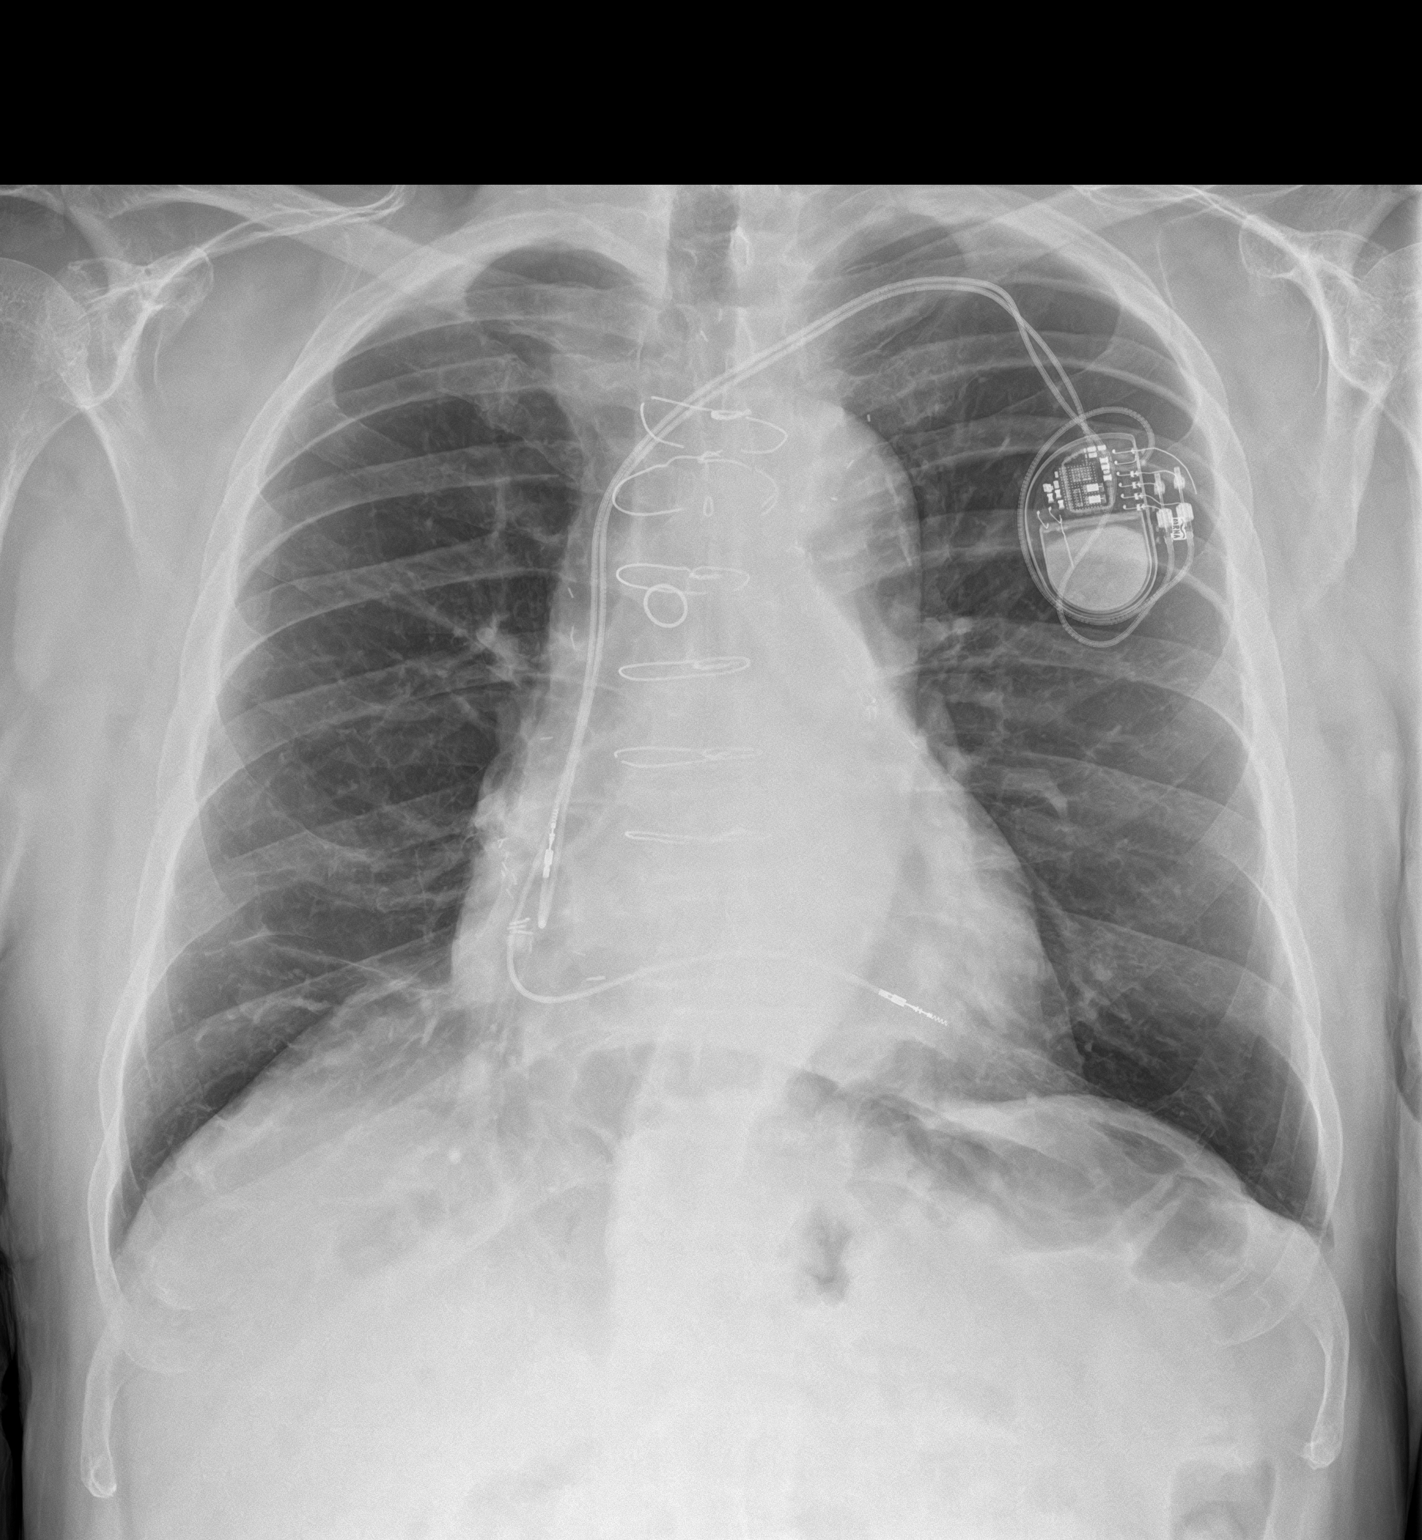
[im 2/2]
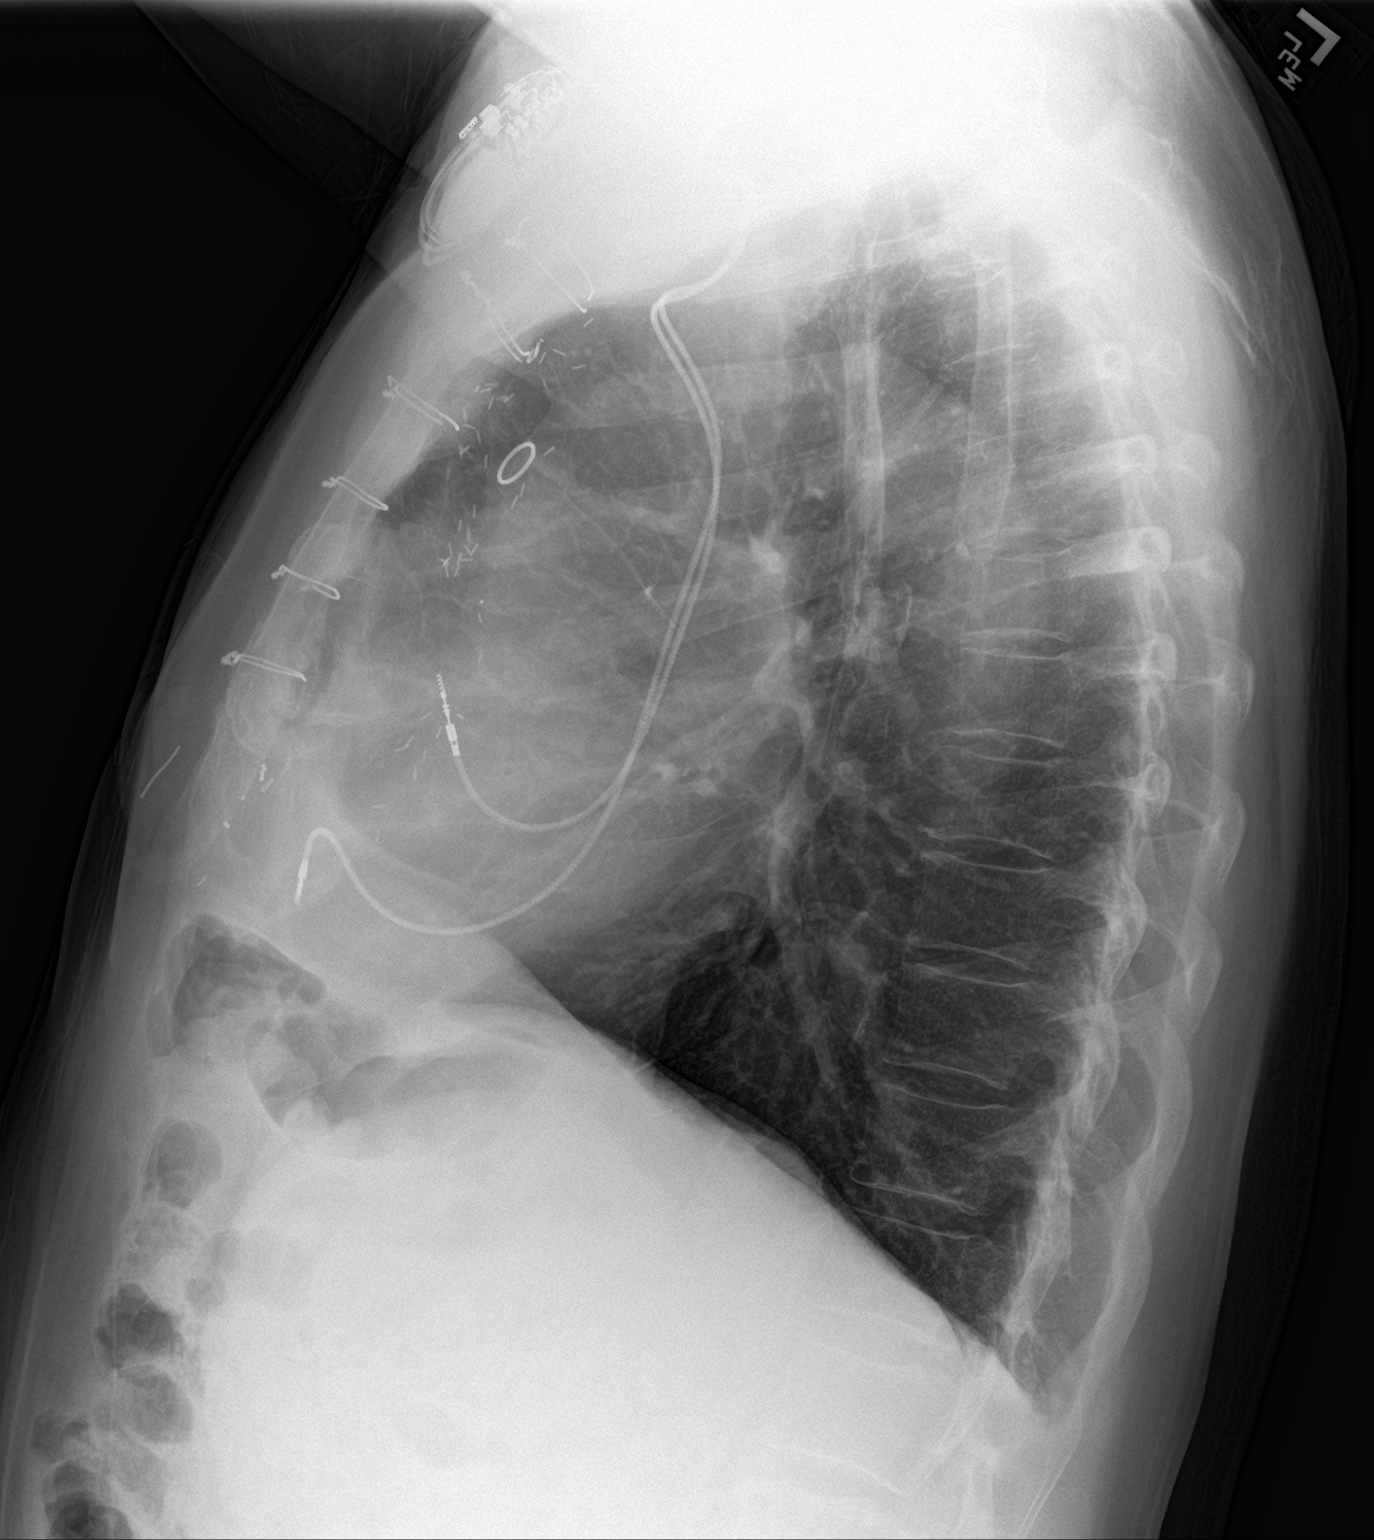

[2 of 2 positions shown; findings below may reference images not displayed]

FINDINGS: Enlarged cardiomediastinal silhouette appears improved from most
recent prior. Prior CABG. Thoracic atherosclerosis. Dual lead pacer
from LEFT subclavian approach. No pneumothorax or effusion. No edema
or consolidation.
IMPRESSION: Improved cardiomediastinal silhouette from prior. No edema or
consolidation. Stable appearing biventricular pacer.

## 2018-03-01 ENCOUNTER — Telehealth: Payer: Self-pay | Admitting: Urology

## 2018-03-01 NOTE — Telephone Encounter (Signed)
Patient called asking for a refill on his tamsulosin be sent to his new pharmacy [facility]   Sharyn Lull

## 2018-03-02 MED ORDER — TAMSULOSIN HCL 0.4 MG PO CAPS
0.4000 mg | ORAL_CAPSULE | Freq: Two times a day (BID) | ORAL | 0 refills | Status: DC
Start: 2018-03-02 — End: 2018-06-09

## 2018-03-02 NOTE — Telephone Encounter (Signed)
rx sent

## 2018-04-06 DIAGNOSIS — C44622 Squamous cell carcinoma of skin of right upper limb, including shoulder: Secondary | ICD-10-CM

## 2018-04-06 HISTORY — DX: Squamous cell carcinoma of skin of right upper limb, including shoulder: C44.622

## 2018-05-24 DIAGNOSIS — C44729 Squamous cell carcinoma of skin of left lower limb, including hip: Secondary | ICD-10-CM

## 2018-05-24 HISTORY — DX: Squamous cell carcinoma of skin of left lower limb, including hip: C44.729

## 2018-06-09 ENCOUNTER — Other Ambulatory Visit: Payer: Self-pay | Admitting: *Deleted

## 2018-06-09 MED ORDER — TAMSULOSIN HCL 0.4 MG PO CAPS: 0.4000 mg | ORAL_CAPSULE | Freq: Two times a day (BID) | ORAL | 0 refills | Status: DC

## 2018-06-14 ENCOUNTER — Ambulatory Visit: Payer: Medicare Other | Admitting: Urology

## 2018-08-03 ENCOUNTER — Ambulatory Visit (INDEPENDENT_AMBULATORY_CARE_PROVIDER_SITE_OTHER): Payer: Medicare Other | Admitting: Urology

## 2018-08-03 ENCOUNTER — Other Ambulatory Visit: Payer: Self-pay

## 2018-08-03 ENCOUNTER — Encounter: Payer: Self-pay | Admitting: Urology

## 2018-08-03 VITALS — BP 147/89 | HR 79 | Ht 69.0 in | Wt 185.8 lb

## 2018-08-03 DIAGNOSIS — Z87898 Personal history of other specified conditions: Secondary | ICD-10-CM | POA: Diagnosis not present

## 2018-08-03 DIAGNOSIS — N401 Enlarged prostate with lower urinary tract symptoms: Secondary | ICD-10-CM

## 2018-08-03 LAB — BLADDER SCAN AMB NON-IMAGING

## 2018-08-03 MED ORDER — TAMSULOSIN HCL 0.4 MG PO CAPS: 0.4000 mg | ORAL_CAPSULE | Freq: Two times a day (BID) | ORAL | 3 refills | Status: DC

## 2018-08-03 NOTE — Progress Notes (Signed)
08/03/2018 2:48 PM   Joshua Frye 1935/08/10 [id number]  Referring provider: [name], MD [facility] [place], [place] [address]  Chief Complaint  Patient presents with  . Benign Prostatic Hypertrophy  . Urinary Retention   Urologic history: 1.  BPH with lower urinary tract symptoms  -Postop urinary retention after herniorrhaphy 2017  -Tamsulosin 0.8 mg daily   HPI: 83 year old male presents for annual follow-up of BPH.  He remains on tamsulosin 0.8 mg daily.  He has stable lower urinary tract symptoms which are not bothersome.  He has seen his nocturia increase to 2-3 times per night but states this is not bothersome. Denies dysuria, gross hematuria or flank/abdominal/pelvic/scrotal pain.  His PCP is still checking his PSA and it was 1.21 in April 2020  PMH: Past Medical History:  Diagnosis Date  . Anxiety   . Asthma   . BPH (benign prostatic hypertrophy) 2017  . Bronchiectasis ([place])   . COPD (chronic obstructive pulmonary disease) ([place])   . Coronary artery disease   . Dysrhythmia    asymptomatic pvcs  . GERD (gastroesophageal reflux disease)   . History of gout    several years ago  . HOH (hard of hearing)    Bilateral Hearing Aids  . Hyperlipidemia   . Hypertension   . Hypothyroidism   . Myocardial infarction ([place]) 1985  . Presence of permanent cardiac pacemaker   . Sleep apnea    OSA--C-PAP    Surgical History: Past Surgical History:  Procedure Laterality Date  . APPENDECTOMY    . COLONOSCOPY WITH PROPOFOL N/A 05/05/2017   Procedure: COLONOSCOPY WITH PROPOFOL;  Surgeon: Manya Silvas, MD;  Location: [facility] ENDOSCOPY;  Service: Endoscopy;  Laterality: N/A;  . CORONARY ANGIOPLASTY     x 2  . CORONARY ARTERY BYPASS GRAFT  1985  . INGUINAL HERNIA REPAIR Right 11/20/2015   Procedure: HERNIA REPAIR INGUINAL ADULT;  Surgeon: [name], MD;  Location: [facility] ORS;  Service: General;  Laterality: Right;  .  INSERT / REPLACE / REMOVE PACEMAKER    . PACEMAKER INSERTION Left 10/15/2016   Procedure: INSERTION PACEMAKER;  Surgeon: Isaias Cowman, MD;  Location: [facility] ORS;  Service: Cardiovascular;  Laterality: Left;  . PACEMAKER LEAD REMOVAL N/A 11/19/2016   Procedure: LEAD REVISION;  Surgeon: Isaias Cowman, MD;  Location: [facility] ORS;  Service: Cardiovascular;  Laterality: N/A;    Home Medications:  Allergies as of 08/03/2018      Reactions   Procardia [nifedipine] Other (See Comments)   Other reaction(s): Unknown dizzy Gets very woozy with this medication      Medication List       Accurate as of August 03, 2018  2:48 PM. If you have any questions, ask your nurse or doctor.        STOP taking these medications   aspirin EC 81 MG tablet Stopped by:  Abbie Sons, MD   Saw Palmetto 450 MG Caps Stopped by:  Abbie Sons, MD     TAKE these medications   amLODipine 10 MG tablet Commonly known as:  NORVASC Take 10 mg by mouth daily.   apixaban 5 MG Tabs tablet Commonly known as:  ELIQUIS Take 5 mg by mouth 2 (two) times daily.   atorvastatin 20 MG tablet Commonly known as:  LIPITOR TAKE ONE (1) TABLET BY MOUTH EVERY DAY   budesonide 180 MCG/ACT inhaler Commonly known as:  PULMICORT Inhale 1 puff into the lungs  daily.   fluticasone 50 MCG/ACT nasal spray Commonly known as:  FLONASE instill 2 sprays into each nostril once daily   ibuprofen 200 MG tablet Commonly known as:  ADVIL Take 400 mg by mouth every 8 (eight) hours as needed for headache or mild pain.   isosorbide mononitrate 30 MG 24 hr tablet Commonly known as:  IMDUR Take 30 mg by mouth daily.   meloxicam 7.5 MG tablet Commonly known as:  MOBIC TAKE 1 TABLET(7.5 MG) BY MOUTH EVERY DAY   montelukast 10 MG tablet Commonly known as:  SINGULAIR Take 10 mg by mouth daily.   multivitamin capsule Take 1 capsule by mouth daily.   nitroGLYCERIN 0.4 MG SL tablet Commonly known as:  NITROSTAT Place 0.4  mg under the tongue every 5 (five) minutes as needed for chest pain.   omeprazole 20 MG capsule Commonly known as:  PRILOSEC TAKE ONE (1) CAPSULE EACH DAY   tamsulosin 0.4 MG Caps capsule Commonly known as:  FLOMAX Take 1 capsule (0.4 mg total) by mouth 2 (two) times daily.   vitamin C 500 MG tablet Commonly known as:  ASCORBIC ACID Take 500 mg by mouth daily.       Allergies:  Allergies  Allergen Reactions  . Procardia [Nifedipine] Other (See Comments)    Other reaction(s): Unknown dizzy Gets very woozy with this medication    Family History: Family History  Problem Relation Age of Onset  . Diabetes Mother     Social History:  reports that he has never smoked. He has never used smokeless tobacco. He reports that he does not drink alcohol or use drugs.  ROS: UROLOGY Frequent Urination?: No Hard to postpone urination?: No Burning/pain with urination?: No Get up at night to urinate?: Yes Leakage of urine?: No Urine stream starts and stops?: No Trouble starting stream?: No Do you have to strain to urinate?: No Blood in urine?: No Urinary tract infection?: No Sexually transmitted disease?: No Injury to kidneys or bladder?: No Painful intercourse?: No Weak stream?: No Erection problems?: No Penile pain?: No  Gastrointestinal Nausea?: No Vomiting?: No Indigestion/heartburn?: No Diarrhea?: No Constipation?: No  Constitutional Fever: No Night sweats?: No Weight loss?: No Fatigue?: No  Skin Skin rash/lesions?: No Itching?: No  Eyes Blurred vision?: No Double vision?: No  Ears/Nose/Throat Sore throat?: No Sinus problems?: No  Hematologic/Lymphatic Swollen glands?: No Easy bruising?: No  Cardiovascular Leg swelling?: No Chest pain?: No  Respiratory Cough?: No Shortness of breath?: No  Endocrine Excessive thirst?: No  Musculoskeletal Back pain?: No Joint pain?: No  Neurological Headaches?: No Dizziness?: No  Psychologic  Depression?: No Anxiety?: No  Physical Exam: BP (!) 147/89 (BP Location: Left Arm, Patient Position: Sitting, Cuff Size: Normal)   Pulse 79   Ht 5\' 9"  (1.753 m)   Wt 185 lb 12.8 oz (84.3 kg)   BMI 27.44 kg/m   Constitutional:  Alert and oriented, No acute distress. HEENT: [place] AT, moist mucus membranes.  Trachea midline, no masses. Cardiovascular: No clubbing, cyanosis, or edema. Respiratory: Normal respiratory effort, no increased work of breathing. Skin: No rashes, bruises or suspicious lesions. Neurologic: Grossly intact, no focal deficits, moving all 4 extremities. Psychiatric: Normal mood and affect.  Assessment & Plan:   83 year old male with stable lower urinary tract symptoms.  Tamsulosin was refilled.  PVR by bladder scan today was 43 mL.  He will continue annual follow-up.   Return in about 1 year (around 08/03/2019) for Recheck, Bladder scan.   C  , MD  [facility] [address], [address] [place], [place] [address] [phone]  

## 2018-08-03 NOTE — Patient Instructions (Signed)

## 2019-06-13 ENCOUNTER — Other Ambulatory Visit: Payer: Self-pay

## 2019-06-13 ENCOUNTER — Ambulatory Visit (INDEPENDENT_AMBULATORY_CARE_PROVIDER_SITE_OTHER): Payer: Medicare Other | Admitting: Dermatology

## 2019-06-13 ENCOUNTER — Encounter: Payer: Self-pay | Admitting: Dermatology

## 2019-06-13 DIAGNOSIS — L821 Other seborrheic keratosis: Secondary | ICD-10-CM

## 2019-06-13 DIAGNOSIS — D485 Neoplasm of uncertain behavior of skin: Secondary | ICD-10-CM

## 2019-06-13 DIAGNOSIS — Z85828 Personal history of other malignant neoplasm of skin: Secondary | ICD-10-CM

## 2019-06-13 DIAGNOSIS — L820 Inflamed seborrheic keratosis: Secondary | ICD-10-CM

## 2019-06-13 DIAGNOSIS — D692 Other nonthrombocytopenic purpura: Secondary | ICD-10-CM | POA: Diagnosis not present

## 2019-06-13 DIAGNOSIS — D1800 Hemangioma unspecified site: Secondary | ICD-10-CM

## 2019-06-13 DIAGNOSIS — D229 Melanocytic nevi, unspecified: Secondary | ICD-10-CM

## 2019-06-13 DIAGNOSIS — L814 Other melanin hyperpigmentation: Secondary | ICD-10-CM

## 2019-06-13 DIAGNOSIS — L570 Actinic keratosis: Secondary | ICD-10-CM | POA: Diagnosis not present

## 2019-06-13 DIAGNOSIS — Z1283 Encounter for screening for malignant neoplasm of skin: Secondary | ICD-10-CM | POA: Diagnosis not present

## 2019-06-13 DIAGNOSIS — B353 Tinea pedis: Secondary | ICD-10-CM

## 2019-06-13 MED ORDER — KETOCONAZOLE 2 % EX CREA: TOPICAL_CREAM | CUTANEOUS | 6 refills | Status: AC

## 2019-06-13 NOTE — Progress Notes (Signed)
Follow-Up Visit   Subjective  Joshua Frye is a 84 y.o. male who presents for the following: UBSE (B/L forearm, and R shoulder lesions that are itchy and irritated - patient would like to have them checked today). Patient presents for skin cancer screening mole check and upper body skin exam  The following portions of the chart were reviewed this encounter and updated as appropriate: Tobacco  Allergies  Meds  Problems  Med Hx  Surg Hx  Fam Hx     Review of Systems: No other skin or systemic complaints.  Objective  Well appearing patient in no apparent distress; mood and affect are within normal limits.  All skin waist up examined.  Objective  R face x 5, R ear x 2, L face x 2 (9): Erythematous thin papules/macules with gritty scale.   Objective  R forearm x 1, back x 1 (2): Erythematous keratotic or waxy stuck-on papule or plaque.   Objective  L forearm near the elbow: 0.6 cm firm papule  Objective  B/L feet: Scaling and maceration web spaces and over distal and lateral soles.   Assessment & Plan  AK (actinic keratosis) (9) R face x 5, R ear x 2, L face x 2  Destruction of lesion - R face x 5, R ear x 2, L face x 2  Destruction method: cryotherapy   Timeout:  patient name, date of birth, surgical site, and procedure verified Lesion destroyed using liquid nitrogen: Yes   Region frozen until ice ball extended beyond lesion: Yes   Outcome: patient tolerated procedure well with no complications   Post-procedure details: wound care instructions given    Inflamed seborrheic keratosis (2) R forearm x 1, back x 1  Destruction of lesion - R forearm x 1, back x 1  Destruction method: cryotherapy   Timeout:  patient name, date of birth, surgical site, and procedure verified Lesion destroyed using liquid nitrogen: Yes   Outcome: patient tolerated procedure well with no complications   Post-procedure details: wound care instructions given    Neoplasm of uncertain  behavior of skin L forearm near the elbow  Epidermal / dermal shaving  Lesion length (cm):  0.6 Lesion width (cm):  0.6 Margin per side (cm):  0.2 Total excision diameter (cm):  1 Informed consent: discussed and consent obtained   Timeout: patient name, date of birth, surgical site, and procedure verified   Procedure prep:  Patient was prepped and draped in usual sterile fashion Prep type:  Isopropyl alcohol Anesthesia: the lesion was anesthetized in a standard fashion   Anesthetic:  1% lidocaine w/ epinephrine 1-100,000 buffered w/ 8.4% NaHCO3 Instrument used: flexible razor blade   Hemostasis achieved with: pressure, aluminum chloride and electrodesiccation   Outcome: patient tolerated procedure well   Post-procedure details: sterile dressing applied and wound care instructions given   Dressing type: bandage and petrolatum    Specimen 1 - Surgical pathology Differential Diagnosis: Cyst vs DF r/o CA Check Margins: No 0.6 cm firm papule  Tinea pedis of left foot B/L feet  Continue Ketoconazole 2% cream QHS  ketoconazole (NIZORAL) 2 % cream - B/L feet  Seborrheic Keratoses - Stuck-on, waxy, tan-brown papules and plaques  - Discussed benign etiology and prognosis. - Observe - Call for any changes  Purpura - Violaceous macules and patches - Benign - Related to age, sun damage and/or use of blood thinners - Observe - Can use OTC arnica containing moisturizer such as Dermend Bruise Formula if  desired - Call for worsening or other concerns  Actinic Damage - diffuse scaly erythematous macules with underlying dyspigmentation - Recommend daily broad spectrum sunscreen SPF 30+ to sun-exposed areas, reapply every 2 hours as needed.  - Call for new or changing lesions.  Hemangiomas - Red papules - Discussed benign nature - Observe - Call for any changes  Lentigines - Scattered tan macules - Discussed due to sun exposure - Benign, observe - Call for any  changes  Melanocytic Nevi - Tan-brown and/or pink-flesh-colored symmetric macules and papules - Benign appearing on exam today - Observation - Call clinic for new or changing moles - Recommend daily use of broad spectrum spf 30+ sunscreen to sun-exposed areas.   History of Squamous Cell Carcinoma of the Skin - No evidence of recurrence today - No lymphadenopathy - Recommend regular full body skin exams - Recommend daily broad spectrum sunscreen SPF 30+ to sun-exposed areas, reapply every 2 hours as needed.  - Call if any new or changing lesions are noted between office visits  Return in about 6 months (around 12/13/2019).   Luther Redo, CMA, am acting as scribe for Sarina Ser, MD . Documentation: I have reviewed the above documentation for accuracy and completeness, and I agree with the above.  Sarina Ser, MD

## 2019-06-13 NOTE — Patient Instructions (Signed)

## 2019-06-14 ENCOUNTER — Encounter: Payer: Self-pay | Admitting: Dermatology

## 2019-06-15 ENCOUNTER — Telehealth: Payer: Self-pay

## 2019-06-15 NOTE — Telephone Encounter (Signed)
Patient informed of pathology results 

## 2019-07-05 ENCOUNTER — Emergency Department: Payer: Medicare Other

## 2019-07-05 ENCOUNTER — Other Ambulatory Visit: Payer: Self-pay

## 2019-07-05 ENCOUNTER — Emergency Department
Admission: EM | Admit: 2019-07-05 | Discharge: 2019-07-05 | Disposition: A | Discharge status: Home / Self Care | Payer: Medicare Other | Source: Home / Self Care | Attending: Emergency Medicine | Admitting: Emergency Medicine

## 2019-07-05 DIAGNOSIS — R7881 Bacteremia: Secondary | ICD-10-CM | POA: Diagnosis not present

## 2019-07-05 DIAGNOSIS — I10 Essential (primary) hypertension: Secondary | ICD-10-CM | POA: Insufficient documentation

## 2019-07-05 DIAGNOSIS — Z951 Presence of aortocoronary bypass graft: Secondary | ICD-10-CM | POA: Insufficient documentation

## 2019-07-05 DIAGNOSIS — Z950 Presence of cardiac pacemaker: Secondary | ICD-10-CM | POA: Insufficient documentation

## 2019-07-05 DIAGNOSIS — R509 Fever, unspecified: Secondary | ICD-10-CM

## 2019-07-05 DIAGNOSIS — J449 Chronic obstructive pulmonary disease, unspecified: Secondary | ICD-10-CM | POA: Insufficient documentation

## 2019-07-05 DIAGNOSIS — Z79899 Other long term (current) drug therapy: Secondary | ICD-10-CM | POA: Insufficient documentation

## 2019-07-05 DIAGNOSIS — Z7901 Long term (current) use of anticoagulants: Secondary | ICD-10-CM | POA: Insufficient documentation

## 2019-07-05 DIAGNOSIS — Z85828 Personal history of other malignant neoplasm of skin: Secondary | ICD-10-CM | POA: Insufficient documentation

## 2019-07-05 DIAGNOSIS — R251 Tremor, unspecified: Secondary | ICD-10-CM | POA: Diagnosis not present

## 2019-07-05 DIAGNOSIS — I2510 Atherosclerotic heart disease of native coronary artery without angina pectoris: Secondary | ICD-10-CM | POA: Insufficient documentation

## 2019-07-05 DIAGNOSIS — E039 Hypothyroidism, unspecified: Secondary | ICD-10-CM | POA: Insufficient documentation

## 2019-07-05 DIAGNOSIS — Z20822 Contact with and (suspected) exposure to covid-19: Secondary | ICD-10-CM | POA: Insufficient documentation

## 2019-07-05 LAB — CBC
HCT: 47.7 % (ref 39.0–52.0)
Hemoglobin: 16.4 g/dL (ref 13.0–17.0)
MCH: 30.3 pg (ref 26.0–34.0)
MCHC: 34.4 g/dL (ref 30.0–36.0)
MCV: 88 fL (ref 80.0–100.0)
Platelets: 182 10*3/uL (ref 150–400)
RBC: 5.42 MIL/uL (ref 4.22–5.81)
RDW: 14.4 % (ref 11.5–15.5)
WBC: 6.6 10*3/uL (ref 4.0–10.5)
nRBC: 0 % (ref 0.0–0.2)

## 2019-07-05 LAB — URINALYSIS, COMPLETE (UACMP) WITH MICROSCOPIC
Bacteria, UA: NONE SEEN
Bilirubin Urine: NEGATIVE
Glucose, UA: NEGATIVE mg/dL
Hgb urine dipstick: NEGATIVE
Ketones, ur: NEGATIVE mg/dL
Leukocytes,Ua: NEGATIVE
Nitrite: NEGATIVE
Protein, ur: NEGATIVE mg/dL
Specific Gravity, Urine: 1.015 (ref 1.005–1.030)
pH: 5 (ref 5.0–8.0)

## 2019-07-05 LAB — POC SARS CORONAVIRUS 2 AG: SARS Coronavirus 2 Ag: NEGATIVE

## 2019-07-05 LAB — COMPREHENSIVE METABOLIC PANEL
ALT: 19 U/L (ref 0–44)
AST: 22 U/L (ref 15–41)
Albumin: 4.4 g/dL (ref 3.5–5.0)
Alkaline Phosphatase: 90 U/L (ref 38–126)
Anion gap: 10 (ref 5–15)
BUN: 21 mg/dL (ref 8–23)
CO2: 23 mmol/L (ref 22–32)
Calcium: 9.3 mg/dL (ref 8.9–10.3)
Chloride: 109 mmol/L (ref 98–111)
Creatinine, Ser: 1.33 mg/dL — ABNORMAL HIGH (ref 0.61–1.24)
GFR calc Af Amer: 58 mL/min — ABNORMAL LOW (ref >60)
GFR calc non Af Amer: 50 mL/min — ABNORMAL LOW (ref >60)
Glucose, Bld: 111 mg/dL — ABNORMAL HIGH (ref 70–99)
Potassium: 4.2 mmol/L (ref 3.5–5.1)
Sodium: 142 mmol/L (ref 135–145)
Total Bilirubin: 1.5 mg/dL — ABNORMAL HIGH (ref 0.3–1.2)
Total Protein: 6.8 g/dL (ref 6.5–8.1)

## 2019-07-05 LAB — LACTIC ACID, PLASMA
Lactic Acid, Venous: 2.9 mmol/L (ref 0.5–1.9)
Lactic Acid, Venous: 2.2 mmol/L (ref 0.5–1.9)
Lactic Acid, Venous: 1.9 mmol/L (ref 0.5–1.9)

## 2019-07-05 LAB — SARS CORONAVIRUS 2 BY RT PCR (HOSPITAL ORDER, PERFORMED IN ~~LOC~~ HOSPITAL LAB): SARS Coronavirus 2: NEGATIVE

## 2019-07-05 LAB — PROCALCITONIN: Procalcitonin: 0.34 ng/mL

## 2019-07-05 MED ORDER — ACETAMINOPHEN 325 MG PO TABS
650.0000 mg | ORAL_TABLET | Freq: Once | ORAL | Status: AC
  Administered 2019-07-05: 650 mg via ORAL
  Filled 2019-07-05: qty 2

## 2019-07-05 MED ORDER — SODIUM CHLORIDE 0.9 % IV SOLN
1.0000 g | Freq: Once | INTRAVENOUS | Status: AC
  Administered 2019-07-05: 1 g via INTRAVENOUS
  Filled 2019-07-05: qty 10

## 2019-07-05 MED ORDER — VANCOMYCIN HCL IN DEXTROSE 1-5 GM/200ML-% IV SOLN
1000.0000 mg | Freq: Once | INTRAVENOUS | Status: AC
  Administered 2019-07-05: 1000 mg via INTRAVENOUS
  Filled 2019-07-05: qty 200

## 2019-07-05 MED ORDER — SODIUM CHLORIDE 0.9 % IV BOLUS
1000.0000 mL | Freq: Once | INTRAVENOUS | Status: AC
  Administered 2019-07-05: 1000 mL via INTRAVENOUS

## 2019-07-05 NOTE — Discharge Instructions (Signed)
Your Covid test was negative.  I am concerned there could be an infection in your blood versus this could be a viral infection.  We offered admission but you declined.  We have given you 1 dose of antibiotics to cover you for 24 hours while your blood cultures are pending.  If your blood cultures are positive we will call you back for admission.  If you develop worsening symptoms in the meantime you need to return to the ER immediately.  If your blood cultures are negative you should still follow-up with your primary care doctor in 2 days.

## 2019-07-05 NOTE — ED Provider Notes (Signed)
[facility] Emergency Department Provider Note  ____________________________________________   First MD Initiated Contact with Patient 07/05/19 1657     (approximate)  I have reviewed the triage vital signs and the nursing notes.   HISTORY  Chief Complaint tremor   HPI Joshua Frye is a 84 y.o. male with COPD, hypertension, hyperlipidemia, pacemaker who comes in for tremors.  Patient reports that he felt fine this morning.  He was prescribed his primary care doctor appointment when he noted that he was having some tremors in his upper extremities and felt chilled.  He otherwise felt fine.  He states that his symptoms have now resolved with Tylenol.  Patient was noted to be febrile but he was not aware of this.  Unclear what brought this on.  Onset today, constant until after the Tylenol.  Denies any shortness of breath, cough, dysuria, abdominal pain, new back pain, joint swelling, rashes.  The only associated symptom patient stated was that he had 4 bowel movements but denied them being watery or runny in nature. No more Bms.  Denies any recent antibiotics.  Patient has Covid vaccination 1 month ago          Past Medical History:  Diagnosis Date  . Anxiety   . Asthma   . BPH (benign prostatic hypertrophy) 2017  . Bronchiectasis ([place])   . COPD (chronic obstructive pulmonary disease) ([place])   . Coronary artery disease   . Dysrhythmia    asymptomatic pvcs  . GERD (gastroesophageal reflux disease)   . History of gout    several years ago  . HOH (hard of hearing)    Bilateral Hearing Aids  . Hyperlipidemia   . Hypertension   . Hypothyroidism   . Myocardial infarction ([place]) 1985  . Presence of permanent cardiac pacemaker   . Sleep apnea    OSA--C-PAP  . 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 Squamous cell carcinoma of arm, left 09/10/2017   L mid dorsum forearm   . Squamous cell carcinoma of arm, right 06/01/2014   R  prox forearm   . Squamous cell carcinoma of hand, right 04/06/2018   R hand thumb webspace   . Squamous cell carcinoma of leg, left 05/24/2018   L mid lat pretibial   . Squamous cell carcinoma of leg, left 05/24/2018   L mid ant med thigh   . Squamous cell carcinoma of leg, left 05/24/2018   L med mid calf  . Squamous cell carcinoma of leg, right 01/16/2015   R lat calf  . Squamous cell carcinoma of skin 01/27/2013   R post forearm  . Squamous cell carcinoma, arm, right 04/27/2013   R prox dorsum forearm     Patient Active Problem List   Diagnosis Date Noted  . Medicare annual wellness visit, initial 06/29/2017  . Tubular adenoma 06/29/2017  . Status post placement of cardiac pacemaker 12/26/2016  . Atrial fibrillation ([place]) 11/26/2016  . Sick sinus syndrome ([place]) 10/15/2016  . Acquired hypothyroidism 06/24/2016  . BPH (benign prostatic hyperplasia) 11/21/2015  . CAD (coronary artery disease), autologous vein bypass graft 11/21/2015  . Adult bronchiectasis ([place]) 06/07/2015  . Asymptomatic PVCs 04/16/2015  . Hyperlipidemia, mixed 06/06/2014  . 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 Benign essential hypertension 03/07/2014  . Chronic obstructive pulmonary disease ([place]) 03/07/2014  . Coronary arteriosclerosis in native artery 03/07/2014  . Enlarged prostate without lower urinary tract  symptoms (luts) 03/07/2014  . Hyperlipidemia 03/07/2014  . Hypersomnia with sleep apnea 03/07/2014  . Asthma 08/11/2013  . Sleep apnea 08/11/2013  . Sleep apnea with use of continuous positive airway pressure (CPAP) 08/11/2013  . Enlarged prostate with lower urinary tract symptoms (LUTS) 02/23/2012  . Nocturia 02/23/2012    Past Surgical History:  Procedure Laterality Date  . APPENDECTOMY    . COLONOSCOPY WITH PROPOFOL N/A 05/05/2017   Procedure: COLONOSCOPY WITH PROPOFOL;  Surgeon: Manya Silvas, MD;  Location: [facility] ENDOSCOPY;  Service: Endoscopy;   Laterality: N/A;  . CORONARY ANGIOPLASTY     x 2  . CORONARY ARTERY BYPASS GRAFT  1985  . INGUINAL HERNIA REPAIR Right 11/20/2015   Procedure: HERNIA REPAIR INGUINAL ADULT;  Surgeon: [name], MD;  Location: [facility] ORS;  Service: General;  Laterality: Right;  . INSERT / REPLACE / REMOVE PACEMAKER    . PACEMAKER INSERTION Left 10/15/2016   Procedure: INSERTION PACEMAKER;  Surgeon: Isaias Cowman, MD;  Location: [facility] ORS;  Service: Cardiovascular;  Laterality: Left;  . PACEMAKER LEAD REMOVAL N/A 11/19/2016   Procedure: LEAD REVISION;  Surgeon: Isaias Cowman, MD;  Location: [facility] ORS;  Service: Cardiovascular;  Laterality: N/A;    Prior to Admission medications   Medication Sig Start Date End Date Taking? Authorizing Provider  amLODipine (NORVASC) 10 MG tablet Take 10 mg by mouth daily.    [provider]  apixaban (ELIQUIS) 5 MG TABS tablet Take 5 mg by mouth 2 (two) times daily.    [provider]  atorvastatin (LIPITOR) 20 MG tablet TAKE ONE (1) TABLET BY MOUTH EVERY DAY 09/05/15   [provider]  budesonide (PULMICORT) 180 MCG/ACT inhaler Inhale 1 puff into the lungs daily.     [provider]  fluticasone Asencion Islam) 50 MCG/ACT nasal spray instill 2 sprays into each nostril once daily 10/06/16   [provider]  ibuprofen (ADVIL,MOTRIN) 200 MG tablet Take 400 mg by mouth every 8 (eight) hours as needed for headache or mild pain.    [provider]  isosorbide mononitrate (IMDUR) 30 MG 24 hr tablet Take 30 mg by mouth daily.  09/07/14   [provider]  ketoconazole (NIZORAL) 2 % cream Apply to the feet and between toes QHS 06/13/19   Ralene Bathe, MD  meloxicam (MOBIC) 7.5 MG tablet TAKE 1 TABLET(7.5 MG) BY MOUTH EVERY DAY 09/03/17   [provider]  montelukast (SINGULAIR) 10 MG tablet Take 10 mg by mouth daily.     [provider]  Multiple Vitamin (MULTIVITAMIN) capsule Take 1 capsule by  mouth daily.    [provider]  nitroGLYCERIN (NITROSTAT) 0.4 MG SL tablet Place 0.4 mg under the tongue every 5 (five) minutes as needed for chest pain.    [provider]  omeprazole (PRILOSEC) 20 MG capsule TAKE ONE (1) CAPSULE EACH DAY 09/05/15   [provider]  tamsulosin (FLOMAX) 0.4 MG CAPS capsule Take 1 capsule (0.4 mg total) by mouth 2 (two) times daily. 08/03/18   Stoioff, Ronda Fairly, MD  vitamin C (ASCORBIC ACID) 500 MG tablet Take 500 mg by mouth daily.    [provider]    Allergies Procardia [nifedipine]  Family History  Problem Relation Age of Onset  . Diabetes Mother     Social History Social History   Tobacco Use  . Smoking status: Never Smoker  . Smokeless tobacco: Never Used  Substance Use Topics  . Alcohol use: No  .  Drug use: No      Review of Systems Constitutional: Positive fever, tremors Eyes: No visual changes. ENT: No sore throat. Cardiovascular: Denies chest pain. Respiratory: Denies shortness of breath. Gastrointestinal: No abdominal pain.  No nausea, no vomiting.  No diarrhea.  No constipation. Genitourinary: Negative for dysuria. Musculoskeletal: Negative for back pain. Skin: Negative for rash. Neurological: Negative for headaches, focal weakness or numbness. All other ROS negative ____________________________________________   PHYSICAL EXAM:  VITAL SIGNS: ED Triage Vitals  Enc Vitals Group     BP 07/05/19 1435 (!) 118/53     Pulse Rate 07/05/19 1435 72     Resp 07/05/19 1435 16     Temp 07/05/19 1435 (!) 101.2 F (38.4 C)     Temp Source 07/05/19 1435 Oral     SpO2 07/05/19 1435 96 %     Weight 07/05/19 1435 178 lb (80.7 kg)     Height 07/05/19 1435 5\' 9"  (1.753 m)     Head Circumference --      Peak Flow --      Pain Score 07/05/19 1447 0     Pain Loc --      Pain Edu? --      Excl. in [place]? --     Constitutional: Alert and oriented. Well appearing and in no acute distress. Eyes:  Conjunctivae are normal. EOMI. Head: Atraumatic. Nose: No congestion/rhinnorhea. Mouth/Throat: Mucous membranes are moist.   Neck: No stridor. Trachea Midline. FROM Cardiovascular: Normal rate, regular rhythm. Grossly normal heart sounds.  Good peripheral circulation. Pacemaker without warmth/ertyhema  Respiratory: Normal respiratory effort.  No retractions. Lungs CTAB. Gastrointestinal: Soft and nontender. No distention. No abdominal bruits.  Musculoskeletal: No lower extremity tenderness nor edema.  No joint effusions. Neurologic:  Normal speech and language. No gross focal neurologic deficits are appreciated.  Skin:  Skin is warm, dry and intact. No rash noted on the observed areas Psychiatric: Mood and affect are normal. Speech and behavior are normal. GU: Deferred   ____________________________________________   LABS (all labs ordered are listed, but only abnormal results are displayed)  Labs Reviewed  COMPREHENSIVE METABOLIC PANEL - Abnormal; Notable for the following components:      Result Value   Glucose, Bld 111 (*)    Creatinine, Ser 1.33 (*)    Total Bilirubin 1.5 (*)    GFR calc non Af Amer 50 (*)    GFR calc Af Amer 58 (*)    All other components within normal limits  LACTIC ACID, PLASMA - Abnormal; Notable for the following components:   Lactic Acid, Venous 2.9 (*)    All other components within normal limits  LACTIC ACID, PLASMA - Abnormal; Notable for the following components:   Lactic Acid, Venous 2.2 (*)    All other components within normal limits  URINALYSIS, COMPLETE (UACMP) WITH MICROSCOPIC - Abnormal; Notable for the following components:   Color, Urine YELLOW (*)    APPearance HAZY (*)    All other components within normal limits  SARS CORONAVIRUS 2 BY RT PCR (HOSPITAL ORDER, [facility])  CULTURE, BLOOD (SINGLE)  CULTURE, BLOOD (SINGLE)  CBC  PROCALCITONIN  LACTIC ACID, PLASMA  PROCALCITONIN  LACTIC ACID, PLASMA  POC  SARS CORONAVIRUS 2 AG -  ED  POC SARS CORONAVIRUS 2 AG   ____________________________________________   RADIOLOGY Robert Bellow, personally viewed and evaluated these images (plain radiographs) as part of my medical decision making, as well as reviewing the written  report by the radiologist.  ED MD interpretation: No pneumonia  Official radiology report(s): DG Chest 2 View  Result Date: 07/05/2019 CLINICAL DATA:  Is fever EXAM: CHEST - 2 VIEW COMPARISON:  11/13/2016 FINDINGS: Left pacer remains in place, unchanged. Prior CABG. Tortuous thoracic aorta with calcifications. Heart is normal size. Scarring at the right lung base. No confluent opacities or effusions. No acute bony abnormality. IMPRESSION: No active cardiopulmonary disease. Electronically Signed   By: Rolm Baptise M.D.   On: 07/05/2019 15:29    ____________________________________________   PROCEDURES  Procedure(s) performed (including Critical Care):  Procedures   ____________________________________________   INITIAL IMPRESSION / ASSESSMENT AND PLAN / ED COURSE  Joshua Frye was evaluated in Emergency Department on 07/05/2019 for the symptoms described in the history of present illness. He was evaluated in the context of the global COVID-19 pandemic, which necessitated consideration that the patient might be at risk for infection with the SARS-CoV-2 virus that causes COVID-19. Institutional protocols and algorithms that pertain to the evaluation of patients at risk for COVID-19 are in a state of rapid change based on information released by regulatory bodies including the CDC and federal and state organizations. These policies and algorithms were followed during the patient's care in the ED.    Patient is a 84 year old who comes in with tremors most likely secondary to some rigors from a temperature.  Will get labs to evaluate Electra abnormalities, AKI.  Will get urine to evaluate for UTI, chest x-ray evaluate for  pneumonia.  No abdominal tenderness to suggest  abdominal infection.  No evidence of septic joints.  No other areas of cellulitis.  No new back pain.  Point-of-care Covid swab was negative.   Labs are reassuring.  White count is normal.  Initial lactate was 2.9 repeat was drawn prior to fluids without intervention it was 2.3.  Patient got some fluids and had another done that was 1.8  His total bili was slightly elevated but continues not have any right upper quadrant tenderness  Procalcitonin was slightly elevated at 0.34 but did not meet the cutoff for sepsis at 0.5.  Discussed with patient that we could admit patient for blood culture rule out patient is adamant that he does not want to stay in the hospital unless necessary.   Patient does not even meet SIRS criteria other than his temperature he has normal heart rate, normal respiratory rate normal white count.  His procalcitonin is slightly elevated but there were no signs of infection.  Repeat abdominal exams are benign.  Patient is very well-appearing is not had continued symptoms.  Discussed with patient that this could have been just because that Tylenol was given that now he is better.  Patient is willing to at least get a dose of ceftriaxone and vancomycin to cover him for 24 hours while blood cultures are pending.  He understands that if his blood cultures are positive that he will need to return to the ER for continued IV antibiotics however he does not want to stay in the hospital while awaiting culture results.  Given how well-appearing patient is with only the temperature that could be viral in nature we will discharge patient home.  He understands that even if the blood cultures are negative that he needs to follow-up for a primary care recheck in 2 days to make sure no other signs of bacterial infection.  He also understands that if his symptoms are changing or worsening at all that he needs to  return to the ER  immediately.       ____________________________________________   FINAL CLINICAL IMPRESSION(S) / ED DIAGNOSES   Final diagnoses:  Fever, unspecified fever cause      MEDICATIONS GIVEN DURING THIS VISIT:  Medications  acetaminophen (TYLENOL) tablet 650 mg (650 mg Oral Given 07/05/19 1454)     ED Discharge Orders    None       Note:  This document was prepared using Dragon voice recognition software and may include unintentional dictation errors.   Vanessa Prince Frederick, MD 07/05/19 1950

## 2019-07-05 NOTE — ED Triage Notes (Addendum)
Pt states that he was scheduled for a check up today with his pcp but developed some diarrhea, approx 4 times, and started having shivering, states that he felt as if the house was cold. Denies any pain, headache, sob, or cough, states maybe a little sob, but relates that to getting upset not being able to reach anyone on the phone. Pt reports that he walked a mile and a half this am without difficulty and states that he completed his covid vaccination series a month ago

## 2019-07-05 NOTE — ED Triage Notes (Signed)
In via EMS from home with c/o tremors. EMS reports per tp he got a little cold this afternoon, started shaking and it just hasnt gone away. Denies pain or SOB. Pt with pacemaker in place. HR 63, 96%, 135/80, FSBS 106

## 2019-07-05 NOTE — ED Notes (Signed)
Pt updated on plan of care and questions answered. Wife at bedside and call light in reach.

## 2019-07-06 ENCOUNTER — Inpatient Hospital Stay
Admission: EM | Admit: 2019-07-06 | Discharge: 2019-07-09 | DRG: 872 | Disposition: A | Discharge status: Home Health | Payer: Medicare Other | Attending: Family Medicine | Admitting: Family Medicine

## 2019-07-06 DIAGNOSIS — B965 Pseudomonas (aeruginosa) (mallei) (pseudomallei) as the cause of diseases classified elsewhere: Secondary | ICD-10-CM | POA: Diagnosis present

## 2019-07-06 DIAGNOSIS — E039 Hypothyroidism, unspecified: Secondary | ICD-10-CM | POA: Diagnosis present

## 2019-07-06 DIAGNOSIS — R7881 Bacteremia: Secondary | ICD-10-CM | POA: Diagnosis present

## 2019-07-06 DIAGNOSIS — I480 Paroxysmal atrial fibrillation: Secondary | ICD-10-CM | POA: Diagnosis present

## 2019-07-06 DIAGNOSIS — I2510 Atherosclerotic heart disease of native coronary artery without angina pectoris: Secondary | ICD-10-CM | POA: Diagnosis present

## 2019-07-06 DIAGNOSIS — N281 Cyst of kidney, acquired: Secondary | ICD-10-CM | POA: Diagnosis present

## 2019-07-06 DIAGNOSIS — Z951 Presence of aortocoronary bypass graft: Secondary | ICD-10-CM

## 2019-07-06 DIAGNOSIS — G4733 Obstructive sleep apnea (adult) (pediatric): Secondary | ICD-10-CM | POA: Diagnosis present

## 2019-07-06 DIAGNOSIS — Z791 Long term (current) use of non-steroidal anti-inflammatories (NSAID): Secondary | ICD-10-CM

## 2019-07-06 DIAGNOSIS — I1310 Hypertensive heart and chronic kidney disease without heart failure, with stage 1 through stage 4 chronic kidney disease, or unspecified chronic kidney disease: Secondary | ICD-10-CM | POA: Diagnosis present

## 2019-07-06 DIAGNOSIS — E782 Mixed hyperlipidemia: Secondary | ICD-10-CM | POA: Diagnosis present

## 2019-07-06 DIAGNOSIS — Z20822 Contact with and (suspected) exposure to covid-19: Secondary | ICD-10-CM | POA: Diagnosis present

## 2019-07-06 DIAGNOSIS — J449 Chronic obstructive pulmonary disease, unspecified: Secondary | ICD-10-CM | POA: Diagnosis present

## 2019-07-06 DIAGNOSIS — Z7951 Long term (current) use of inhaled steroids: Secondary | ICD-10-CM

## 2019-07-06 DIAGNOSIS — K5730 Diverticulosis of large intestine without perforation or abscess without bleeding: Secondary | ICD-10-CM | POA: Diagnosis present

## 2019-07-06 DIAGNOSIS — N179 Acute kidney failure, unspecified: Secondary | ICD-10-CM

## 2019-07-06 DIAGNOSIS — K8020 Calculus of gallbladder without cholecystitis without obstruction: Secondary | ICD-10-CM | POA: Diagnosis present

## 2019-07-06 DIAGNOSIS — R7989 Other specified abnormal findings of blood chemistry: Secondary | ICD-10-CM

## 2019-07-06 DIAGNOSIS — Z888 Allergy status to other drugs, medicaments and biological substances status: Secondary | ICD-10-CM

## 2019-07-06 DIAGNOSIS — Z872 Personal history of diseases of the skin and subcutaneous tissue: Secondary | ICD-10-CM

## 2019-07-06 DIAGNOSIS — K219 Gastro-esophageal reflux disease without esophagitis: Secondary | ICD-10-CM | POA: Diagnosis present

## 2019-07-06 DIAGNOSIS — H9190 Unspecified hearing loss, unspecified ear: Secondary | ICD-10-CM | POA: Diagnosis present

## 2019-07-06 DIAGNOSIS — N1831 Chronic kidney disease, stage 3a: Secondary | ICD-10-CM | POA: Diagnosis present

## 2019-07-06 DIAGNOSIS — G4730 Sleep apnea, unspecified: Secondary | ICD-10-CM | POA: Diagnosis present

## 2019-07-06 DIAGNOSIS — Z7901 Long term (current) use of anticoagulants: Secondary | ICD-10-CM

## 2019-07-06 DIAGNOSIS — Z95828 Presence of other vascular implants and grafts: Secondary | ICD-10-CM

## 2019-07-06 DIAGNOSIS — Z85828 Personal history of other malignant neoplasm of skin: Secondary | ICD-10-CM

## 2019-07-06 DIAGNOSIS — I4891 Unspecified atrial fibrillation: Secondary | ICD-10-CM | POA: Diagnosis present

## 2019-07-06 DIAGNOSIS — I252 Old myocardial infarction: Secondary | ICD-10-CM

## 2019-07-06 DIAGNOSIS — I25810 Atherosclerosis of coronary artery bypass graft(s) without angina pectoris: Secondary | ICD-10-CM | POA: Diagnosis present

## 2019-07-06 DIAGNOSIS — Z833 Family history of diabetes mellitus: Secondary | ICD-10-CM

## 2019-07-06 DIAGNOSIS — I714 Abdominal aortic aneurysm, without rupture: Secondary | ICD-10-CM | POA: Diagnosis present

## 2019-07-06 DIAGNOSIS — Z79899 Other long term (current) drug therapy: Secondary | ICD-10-CM

## 2019-07-06 DIAGNOSIS — Z950 Presence of cardiac pacemaker: Secondary | ICD-10-CM | POA: Diagnosis present

## 2019-07-06 LAB — BLOOD CULTURE ID PANEL (REFLEXED)

## 2019-07-06 MED ORDER — PIPERACILLIN-TAZOBACTAM 3.375 G IVPB 30 MIN
3.3750 g | Freq: Once | INTRAVENOUS | Status: AC
  Administered 2019-07-07: 3.375 g via INTRAVENOUS
  Filled 2019-07-06: qty 50

## 2019-07-06 NOTE — ED Notes (Signed)
Wife updated on positive blood cultures and recommendation to return to ED. Wife reports she will bring pt to ED at this time.

## 2019-07-06 NOTE — Progress Notes (Signed)
PHARMACY - PHYSICIAN COMMUNICATION CRITICAL VALUE ALERT - BLOOD CULTURE IDENTIFICATION (BCID)  Joshua Frye is an 84 y.o. male who presented to [facility] on 07/05/2019 with a chief complaint of tremor/chills/fever.   Assessment:  Pseudomonas growing in 1 of 4 bottles  (include suspected source if known)  Name of physician (or Provider) Contacted: Tommy Rainwater (ED charge RN)   Current antibiotics:  Pt was seen on 5/11 for sx of infection and received ceftriaxone 1 gm IV X 1 and Vancomycin 1 gm IV X 1 on 5/11 .   Pt insisted on leaving, Dr Jari Pigg wanted him to return if cultures grew out positive.    Changes to prescribed antibiotics recommended:  Yes, recommend having pt return to [facility] to receive cefepime 2 gm IV Q12H  Results for orders placed or performed during the hospital encounter of 07/05/19  Blood Culture ID Panel (Reflexed) (Collected: 07/05/2019  2:52 PM)  Result Value Ref Range   Enterococcus species NOT DETECTED NOT DETECTED   Listeria monocytogenes NOT DETECTED NOT DETECTED   Staphylococcus species NOT DETECTED NOT DETECTED   Staphylococcus aureus (BCID) NOT DETECTED NOT DETECTED   Streptococcus species NOT DETECTED NOT DETECTED   Streptococcus agalactiae NOT DETECTED NOT DETECTED   Streptococcus pneumoniae NOT DETECTED NOT DETECTED   Streptococcus pyogenes NOT DETECTED NOT DETECTED   Acinetobacter baumannii NOT DETECTED NOT DETECTED   Enterobacteriaceae species NOT DETECTED NOT DETECTED   Enterobacter cloacae complex NOT DETECTED NOT DETECTED   Escherichia coli NOT DETECTED NOT DETECTED   Klebsiella oxytoca NOT DETECTED NOT DETECTED   Klebsiella pneumoniae NOT DETECTED NOT DETECTED   Proteus species NOT DETECTED NOT DETECTED   Serratia marcescens NOT DETECTED NOT DETECTED   Carbapenem resistance NOT DETECTED NOT DETECTED   Haemophilus influenzae NOT DETECTED NOT DETECTED   Neisseria meningitidis NOT DETECTED NOT DETECTED   Pseudomonas aeruginosa DETECTED (A) NOT  DETECTED   Candida albicans NOT DETECTED NOT DETECTED   Candida glabrata NOT DETECTED NOT DETECTED   Candida krusei NOT DETECTED NOT DETECTED   Candida parapsilosis NOT DETECTED NOT DETECTED   Candida tropicalis NOT DETECTED NOT DETECTED    [name] 07/06/2019  9:37 PM

## 2019-07-06 NOTE — ED Triage Notes (Addendum)
Pt to ED yesterday for a fever and shaking. Pt was treated and discharged but then called back to ED tonight for a positive blood culture. Pt reports since discharge he has continued to have a fever and has been sleeping all day.   Pt last took tylenol at 2230

## 2019-07-06 NOTE — ED Provider Notes (Addendum)
[facility] Emergency Department Provider Note  ____________________________________________   First MD Initiated Contact with Patient 07/06/19 2353     (approximate)  I have reviewed the triage vital signs and the nursing notes.   HISTORY  Chief Complaint positive blood cultures    HPI Joshua Frye is a 84 y.o. male with below list of previous medical conditions including COPD hypertension hyperlipidemia recent ED visit on 07/05/2019 secondary to tremors, chills and diaphoresis returns to the emergency department secondary to positive blood culture of Pseudomonas aeruginosa which was obtained today.  Patient denies any cough or dyspnea.  Patient denies any abdominal pain no nausea vomiting or diarrhea.   Past Medical History:  Diagnosis Date  . Anxiety   . Asthma   . BPH (benign prostatic hypertrophy) 2017  . Bronchiectasis ([place])   . COPD (chronic obstructive pulmonary disease) ([place])   . Coronary artery disease   . Dysrhythmia    asymptomatic pvcs  . GERD (gastroesophageal reflux disease)   . History of gout    several years ago  . HOH (hard of hearing)    Bilateral Hearing Aids  . Hyperlipidemia   . Hypertension   . Hypothyroidism   . Myocardial infarction ([place]) 1985  . Presence of permanent cardiac pacemaker   . Sleep apnea    OSA--C-PAP  . 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 Squamous cell carcinoma of arm, left 09/10/2017   L mid dorsum forearm   . Squamous cell carcinoma of arm, right 06/01/2014   R prox forearm   . Squamous cell carcinoma of hand, right 04/06/2018   R hand thumb webspace   . Squamous cell carcinoma of leg, left 05/24/2018   L mid lat pretibial   . Squamous cell carcinoma of leg, left 05/24/2018   L mid ant med thigh   . Squamous cell carcinoma of leg, left 05/24/2018   L med mid calf  . Squamous cell carcinoma of leg, right 01/16/2015   R lat calf  . Squamous cell carcinoma of skin  01/27/2013   R post forearm  . Squamous cell carcinoma, arm, right 04/27/2013   R prox dorsum forearm     Patient Active Problem List   Diagnosis Date Noted  . Medicare annual wellness visit, initial 06/29/2017  . Tubular adenoma 06/29/2017  . Status post placement of cardiac pacemaker 12/26/2016  . Atrial fibrillation ([place]) 11/26/2016  . Sick sinus syndrome ([place]) 10/15/2016  . Acquired hypothyroidism 06/24/2016  . BPH (benign prostatic hyperplasia) 11/21/2015  . CAD (coronary artery disease), autologous vein bypass graft 11/21/2015  . Adult bronchiectasis ([place]) 06/07/2015  . Asymptomatic PVCs 04/16/2015  . Hyperlipidemia, mixed 06/06/2014  . 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 Benign essential hypertension 03/07/2014  . Chronic obstructive pulmonary disease ([place]) 03/07/2014  . Coronary arteriosclerosis in native artery 03/07/2014  . Enlarged prostate without lower urinary tract symptoms (luts) 03/07/2014  . Hyperlipidemia 03/07/2014  . Hypersomnia with sleep apnea 03/07/2014  . Asthma 08/11/2013  . Sleep apnea 08/11/2013  . Sleep apnea with use of continuous positive airway pressure (CPAP) 08/11/2013  . Enlarged prostate with lower urinary tract symptoms (LUTS) 02/23/2012  . Nocturia 02/23/2012    Past Surgical History:  Procedure Laterality Date  . APPENDECTOMY    . COLONOSCOPY WITH PROPOFOL N/A 05/05/2017   Procedure: COLONOSCOPY WITH PROPOFOL;  Surgeon: Manya Silvas, MD;  Location: [facility] ENDOSCOPY;  Service: Endoscopy;  Laterality: N/A;  . CORONARY ANGIOPLASTY     x 2  . CORONARY ARTERY BYPASS GRAFT  1985  . INGUINAL HERNIA REPAIR Right 11/20/2015   Procedure: HERNIA REPAIR INGUINAL ADULT;  Surgeon: [name], MD;  Location: [facility] ORS;  Service: General;  Laterality: Right;  . INSERT / REPLACE / REMOVE PACEMAKER    . PACEMAKER INSERTION Left 10/15/2016   Procedure: INSERTION PACEMAKER;  Surgeon: Isaias Cowman,  MD;  Location: [facility] ORS;  Service: Cardiovascular;  Laterality: Left;  . PACEMAKER LEAD REMOVAL N/A 11/19/2016   Procedure: LEAD REVISION;  Surgeon: Isaias Cowman, MD;  Location: [facility] ORS;  Service: Cardiovascular;  Laterality: N/A;    Prior to Admission medications   Medication Sig Start Date End Date Taking? Authorizing Provider  amLODipine (NORVASC) 10 MG tablet Take 10 mg by mouth daily.   Yes [provider]  apixaban (ELIQUIS) 5 MG TABS tablet Take 5 mg by mouth 2 (two) times daily.   Yes [provider]  atorvastatin (LIPITOR) 20 MG tablet TAKE ONE (1) TABLET BY MOUTH EVERY DAY 09/05/15  Yes [provider]  budesonide (PULMICORT) 180 MCG/ACT inhaler Inhale 1 puff into the lungs daily.    Yes [provider]  fluticasone (FLONASE) 50 MCG/ACT nasal spray instill 2 sprays into each nostril once daily 10/06/16  Yes [provider]  isosorbide mononitrate (IMDUR) 30 MG 24 hr tablet Take 30 mg by mouth daily.  09/07/14  Yes [provider]  NON FORMULARY    Yes [provider]  tamsulosin (FLOMAX) 0.4 MG CAPS capsule Take 1 capsule (0.4 mg total) by mouth 2 (two) times daily. 08/03/18  Yes Stoioff, Ronda Fairly, MD  ibuprofen (ADVIL,MOTRIN) 200 MG tablet Take 400 mg by mouth every 8 (eight) hours as needed for headache or mild pain.    [provider]  ketoconazole (NIZORAL) 2 % cream Apply to the feet and between toes QHS Patient not taking: Reported on 07/06/2019 06/13/19   Ralene Bathe, MD  meloxicam (MOBIC) 7.5 MG tablet TAKE 1 TABLET(7.5 MG) BY MOUTH EVERY DAY 09/03/17   [provider]  montelukast (SINGULAIR) 10 MG tablet Take 10 mg by mouth daily.     [provider]  Multiple Vitamin (MULTIVITAMIN) capsule Take 1 capsule by mouth daily.    [provider]  nitroGLYCERIN (NITROSTAT) 0.4 MG SL tablet Place 0.4 mg under the tongue every 5 (five) minutes as needed for chest pain.    [provider]  omeprazole (PRILOSEC) 20 MG capsule TAKE ONE (1) CAPSULE EACH DAY 09/05/15   [provider]  vitamin C (ASCORBIC ACID) 500 MG tablet Take 500 mg by mouth daily.    [provider]    Allergies Procardia [nifedipine]  Family History  Problem Relation Age of Onset  . Diabetes Mother     Social History Social History   Tobacco Use  . Smoking status: Never Smoker  . Smokeless tobacco: Never Used  Substance Use Topics  . Alcohol use: No  . Drug use: No    Review of Systems Constitutional: No fever/chills Eyes: No visual changes. ENT: No sore throat. Cardiovascular: Denies chest pain. Respiratory: Denies shortness of breath. Gastrointestinal: No abdominal pain.  No nausea, no vomiting.  No diarrhea.  No constipation. Genitourinary: Negative for dysuria. Musculoskeletal: Negative for neck pain.  Negative for back pain. Integumentary: Negative for rash. Neurological: Negative for headaches, focal weakness or numbness.   ____________________________________________  PHYSICAL EXAM:  VITAL SIGNS: ED Triage Vitals [07/06/19 2303]  Enc Vitals Group     BP 130/69     Pulse Rate 66     Resp 16     Temp 97.9 F (36.6 C)     Temp Source Oral     SpO2 96 %     Weight      Height      Head Circumference      Peak Flow      Pain Score      Pain Loc      Pain Edu?      Excl. in [place]?     Constitutional: Alert and oriented.  Eyes: Conjunctivae are normal.  Mouth/Throat: Patient is wearing a mask. Neck: No stridor.  No meningeal signs.   Cardiovascular: Normal rate, regular rhythm. Good peripheral circulation. Grossly normal heart sounds. Respiratory: Normal respiratory effort.  No retractions. Gastrointestinal: Soft and nontender. No distention.  Musculoskeletal: No lower extremity tenderness nor edema. No gross deformities of extremities. Neurologic:  Normal speech and language. No gross focal neurologic deficits are appreciated.   Skin:  Skin is warm, dry and intact. Psychiatric: Mood and affect are normal. Speech and behavior are normal.  ____________________________________________   LABS (all labs ordered are listed, but only abnormal results are displayed)  Labs Reviewed  CULTURE, BLOOD (ROUTINE X 2)  CULTURE, BLOOD (ROUTINE X 2)  CBC  COMPREHENSIVE METABOLIC PANEL  LACTIC ACID, PLASMA  LACTIC ACID, PLASMA  APTT  PROTIME-INR    Procedures  ED ECG REPORT I, [place] Alexios Keown, the attending physician, personally viewed and interpreted this ECG.   Date: 07/07/2019  EKG Time: 12:21 AM  Rate: 60  Rhythm: Atrial fibrillation  Axis: Leftward axis  Intervals: Normal  ST&T Change: None  ____________________________________________   INITIAL IMPRESSION / MDM / ASSESSMENT AND PLAN / ED COURSE  As part of my medical decision making, I reviewed the following data within the electronic MEDICAL RECORD NUMBER 84 year old male presented with above-stated history and physical exam secondary to blood cultures that grew Pseudomonas aeruginosa.  Patient currently afebrile temperature 97.9 normotensive 130/69 patient given Zosyn 4.5 g.  Repeat blood cultures obtained before antibiotic administration.  Patient discussed with Dr. Damita Dunnings for hospital admission further evaluation and management ____________________________________________  FINAL CLINICAL IMPRESSION(S) / ED DIAGNOSES  Final diagnoses:  Bacteremia due to Pseudomonas     MEDICATIONS GIVEN DURING THIS VISIT:  Medications  piperacillin-tazobactam (ZOSYN) IVPB 3.375 g (has no administration in time range)     ED Discharge Orders    None      *Please note:  Joshua Frye was evaluated in Emergency Department on 07/06/2019 for the symptoms described in the history of present illness. He was evaluated in the context of the global COVID-19 pandemic, which necessitated consideration that the patient might be at risk for infection with the SARS-CoV-2 virus  that causes COVID-19. Institutional protocols and algorithms that pertain to the evaluation of patients at risk for COVID-19 are in a state of rapid change based on information released by regulatory bodies including the CDC and federal and state organizations. These policies and algorithms were followed during the patient's care in the ED.  Some ED evaluations and interventions may be delayed as a result of limited staffing during the pandemic.*  Note:  This document was prepared using Dragon voice recognition software and may include unintentional dictation errors.   Gregor Hams, MD 07/07/19 0109    [place] Shark,  Valli Glance, MD 08/18/19 [phone]

## 2019-07-07 ENCOUNTER — Other Ambulatory Visit: Payer: Self-pay

## 2019-07-07 ENCOUNTER — Encounter: Payer: Self-pay | Admitting: Family Medicine

## 2019-07-07 DIAGNOSIS — Z951 Presence of aortocoronary bypass graft: Secondary | ICD-10-CM | POA: Diagnosis not present

## 2019-07-07 DIAGNOSIS — N179 Acute kidney failure, unspecified: Secondary | ICD-10-CM | POA: Diagnosis present

## 2019-07-07 DIAGNOSIS — Z79899 Other long term (current) drug therapy: Secondary | ICD-10-CM | POA: Diagnosis not present

## 2019-07-07 DIAGNOSIS — Z872 Personal history of diseases of the skin and subcutaneous tissue: Secondary | ICD-10-CM

## 2019-07-07 DIAGNOSIS — K219 Gastro-esophageal reflux disease without esophagitis: Secondary | ICD-10-CM | POA: Diagnosis present

## 2019-07-07 DIAGNOSIS — J449 Chronic obstructive pulmonary disease, unspecified: Secondary | ICD-10-CM | POA: Diagnosis present

## 2019-07-07 DIAGNOSIS — R7881 Bacteremia: Secondary | ICD-10-CM | POA: Diagnosis present

## 2019-07-07 DIAGNOSIS — E782 Mixed hyperlipidemia: Secondary | ICD-10-CM | POA: Diagnosis present

## 2019-07-07 DIAGNOSIS — B965 Pseudomonas (aeruginosa) (mallei) (pseudomallei) as the cause of diseases classified elsewhere: Secondary | ICD-10-CM | POA: Diagnosis not present

## 2019-07-07 DIAGNOSIS — I714 Abdominal aortic aneurysm, without rupture: Secondary | ICD-10-CM | POA: Diagnosis present

## 2019-07-07 DIAGNOSIS — K5730 Diverticulosis of large intestine without perforation or abscess without bleeding: Secondary | ICD-10-CM | POA: Diagnosis present

## 2019-07-07 DIAGNOSIS — E039 Hypothyroidism, unspecified: Secondary | ICD-10-CM | POA: Diagnosis present

## 2019-07-07 DIAGNOSIS — Z7901 Long term (current) use of anticoagulants: Secondary | ICD-10-CM | POA: Diagnosis not present

## 2019-07-07 DIAGNOSIS — Z85828 Personal history of other malignant neoplasm of skin: Secondary | ICD-10-CM | POA: Diagnosis not present

## 2019-07-07 DIAGNOSIS — I25810 Atherosclerosis of coronary artery bypass graft(s) without angina pectoris: Secondary | ICD-10-CM | POA: Diagnosis present

## 2019-07-07 DIAGNOSIS — I480 Paroxysmal atrial fibrillation: Secondary | ICD-10-CM | POA: Diagnosis present

## 2019-07-07 DIAGNOSIS — I2510 Atherosclerotic heart disease of native coronary artery without angina pectoris: Secondary | ICD-10-CM | POA: Diagnosis present

## 2019-07-07 DIAGNOSIS — I252 Old myocardial infarction: Secondary | ICD-10-CM | POA: Diagnosis not present

## 2019-07-07 DIAGNOSIS — K8020 Calculus of gallbladder without cholecystitis without obstruction: Secondary | ICD-10-CM | POA: Diagnosis present

## 2019-07-07 DIAGNOSIS — N1831 Chronic kidney disease, stage 3a: Secondary | ICD-10-CM | POA: Diagnosis present

## 2019-07-07 DIAGNOSIS — I1310 Hypertensive heart and chronic kidney disease without heart failure, with stage 1 through stage 4 chronic kidney disease, or unspecified chronic kidney disease: Secondary | ICD-10-CM | POA: Diagnosis present

## 2019-07-07 DIAGNOSIS — Z791 Long term (current) use of non-steroidal anti-inflammatories (NSAID): Secondary | ICD-10-CM | POA: Diagnosis not present

## 2019-07-07 DIAGNOSIS — G4733 Obstructive sleep apnea (adult) (pediatric): Secondary | ICD-10-CM | POA: Diagnosis present

## 2019-07-07 DIAGNOSIS — Z7951 Long term (current) use of inhaled steroids: Secondary | ICD-10-CM | POA: Diagnosis not present

## 2019-07-07 DIAGNOSIS — N281 Cyst of kidney, acquired: Secondary | ICD-10-CM | POA: Diagnosis present

## 2019-07-07 DIAGNOSIS — R251 Tremor, unspecified: Secondary | ICD-10-CM | POA: Diagnosis present

## 2019-07-07 DIAGNOSIS — Z20822 Contact with and (suspected) exposure to covid-19: Secondary | ICD-10-CM | POA: Diagnosis present

## 2019-07-07 DIAGNOSIS — Z9889 Other specified postprocedural states: Secondary | ICD-10-CM

## 2019-07-07 LAB — CBC
HCT: 44.8 % (ref 39.0–52.0)
Hemoglobin: 14.9 g/dL (ref 13.0–17.0)
MCH: 30.5 pg (ref 26.0–34.0)
MCHC: 33.3 g/dL (ref 30.0–36.0)
MCV: 91.6 fL (ref 80.0–100.0)
Platelets: 138 10*3/uL — ABNORMAL LOW (ref 150–400)
RBC: 4.89 MIL/uL (ref 4.22–5.81)
RDW: 14.7 % (ref 11.5–15.5)
WBC: 11 10*3/uL — ABNORMAL HIGH (ref 4.0–10.5)
nRBC: 0 % (ref 0.0–0.2)

## 2019-07-07 LAB — APTT: aPTT: 42 seconds — ABNORMAL HIGH (ref 24–36)

## 2019-07-07 LAB — COMPREHENSIVE METABOLIC PANEL
ALT: 20 U/L (ref 0–44)
AST: 22 U/L (ref 15–41)
Albumin: 3.5 g/dL (ref 3.5–5.0)
Alkaline Phosphatase: 66 U/L (ref 38–126)
Anion gap: 6 (ref 5–15)
BUN: 24 mg/dL — ABNORMAL HIGH (ref 8–23)
CO2: 25 mmol/L (ref 22–32)
Calcium: 8.3 mg/dL — ABNORMAL LOW (ref 8.9–10.3)
Chloride: 109 mmol/L (ref 98–111)
Creatinine, Ser: 1.56 mg/dL — ABNORMAL HIGH (ref 0.61–1.24)
GFR calc Af Amer: 48 mL/min — ABNORMAL LOW (ref >60)
GFR calc non Af Amer: 41 mL/min — ABNORMAL LOW (ref >60)
Glucose, Bld: 111 mg/dL — ABNORMAL HIGH (ref 70–99)
Potassium: 3.3 mmol/L — ABNORMAL LOW (ref 3.5–5.1)
Sodium: 140 mmol/L (ref 135–145)
Total Bilirubin: 1.1 mg/dL (ref 0.3–1.2)
Total Protein: 5.7 g/dL — ABNORMAL LOW (ref 6.5–8.1)

## 2019-07-07 LAB — PROTIME-INR
INR: 2.2 — ABNORMAL HIGH (ref 0.8–1.2)
Prothrombin Time: 23.8 seconds — ABNORMAL HIGH (ref 11.4–15.2)

## 2019-07-07 LAB — LACTIC ACID, PLASMA: Lactic Acid, Venous: 1.1 mmol/L (ref 0.5–1.9)

## 2019-07-07 MED ORDER — ACETAMINOPHEN 650 MG RE SUPP: 650.0000 mg | Freq: Four times a day (QID) | RECTAL | Status: DC | PRN

## 2019-07-07 MED ORDER — NITROGLYCERIN 0.4 MG SL SUBL: 0.4000 mg | SUBLINGUAL_TABLET | SUBLINGUAL | Status: DC | PRN

## 2019-07-07 MED ORDER — PIPERACILLIN-TAZOBACTAM 3.375 G IVPB
3.3750 g | Freq: Three times a day (TID) | INTRAVENOUS | Status: DC
  Administered 2019-07-07 – 2019-07-08 (×4): 3.375 g via INTRAVENOUS
  Filled 2019-07-07 (×9): qty 50

## 2019-07-07 MED ORDER — POTASSIUM CHLORIDE CRYS ER 20 MEQ PO TBCR
40.0000 meq | EXTENDED_RELEASE_TABLET | Freq: Once | ORAL | Status: AC
  Administered 2019-07-07: 40 meq via ORAL
  Filled 2019-07-07: qty 2

## 2019-07-07 MED ORDER — ACETAMINOPHEN 325 MG PO TABS: 650.0000 mg | ORAL_TABLET | Freq: Four times a day (QID) | ORAL | Status: DC | PRN

## 2019-07-07 MED ORDER — ATORVASTATIN CALCIUM 20 MG PO TABS
40.0000 mg | ORAL_TABLET | Freq: Every day | ORAL | Status: DC
  Administered 2019-07-07 – 2019-07-09 (×3): 40 mg via ORAL
  Filled 2019-07-07 (×3): qty 2

## 2019-07-07 MED ORDER — ONDANSETRON HCL 4 MG/2ML IJ SOLN: 4.0000 mg | Freq: Four times a day (QID) | INTRAMUSCULAR | Status: DC | PRN

## 2019-07-07 MED ORDER — ISOSORBIDE MONONITRATE ER 30 MG PO TB24
30.0000 mg | ORAL_TABLET | Freq: Every day | ORAL | Status: DC
  Administered 2019-07-07 – 2019-07-09 (×3): 30 mg via ORAL
  Filled 2019-07-07 (×3): qty 1

## 2019-07-07 MED ORDER — ONDANSETRON HCL 4 MG PO TABS: 4.0000 mg | ORAL_TABLET | Freq: Four times a day (QID) | ORAL | Status: DC | PRN

## 2019-07-07 MED ORDER — APIXABAN 5 MG PO TABS
5.0000 mg | ORAL_TABLET | Freq: Two times a day (BID) | ORAL | Status: DC
  Administered 2019-07-07 – 2019-07-09 (×5): 5 mg via ORAL
  Filled 2019-07-07 (×6): qty 1

## 2019-07-07 MED ORDER — SODIUM CHLORIDE 0.45 % IV SOLN: INTRAVENOUS | Status: DC

## 2019-07-07 MED ORDER — BUDESONIDE 180 MCG/ACT IN AEPB
1.0000 | INHALATION_SPRAY | Freq: Every day | RESPIRATORY_TRACT | Status: DC
  Administered 2019-07-07 – 2019-07-09 (×3): 1 via RESPIRATORY_TRACT
  Filled 2019-07-07: qty 1

## 2019-07-07 MED ORDER — FLUTICASONE PROPIONATE 50 MCG/ACT NA SUSP
2.0000 | Freq: Every day | NASAL | Status: DC
  Administered 2019-07-07 – 2019-07-09 (×3): 2 via NASAL
  Filled 2019-07-07: qty 16

## 2019-07-07 MED ORDER — HYDROCODONE-ACETAMINOPHEN 5-325 MG PO TABS: 1.0000 | ORAL_TABLET | ORAL | Status: DC | PRN

## 2019-07-07 NOTE — ED Notes (Signed)
Pt needed assistance with emptying urinal. Pt's call light in reach.

## 2019-07-07 NOTE — Progress Notes (Signed)
CODE SEPSIS - PHARMACY COMMUNICATION  **Broad Spectrum Antibiotics should be administered within 1 hour of Sepsis diagnosis**  Time Code Sepsis Called/Page Received: 2359  Antibiotics Ordered: Zosyn  Time of 1st antibiotic administration: 0015  Additional action taken by pharmacy: n/a  If necessary, Name of Provider/Nurse Contacted: n/a    Ena Dawley ,PharmD Clinical Pharmacist  07/07/2019  12:33 AM

## 2019-07-07 NOTE — H&P (Signed)
History and Physical    Joshua Frye [id number] DOB: 19-Apr-1935 DOA: 07/06/2019  PCP: [name], MD   Patient coming from: home I have personally briefly reviewed patient's old medical records in [place]  Chief Complaint: positive blood culture, fever  HPI: Joshua Frye is a 84 y.o. male with medical history significant for CAD status post CABG x3, pacemaker, COPD, A. fib on Eliquis, OSA on CPAP, seen in the emergency room 1 day prior for fever and chills, with elevated lactic acid of 2.2 without definite source, treated with IV Rocephin and vancomycin x1 and discharged as patient refused to stay, who was brought back to the emergency room after blood cultures drawn the day prior grew Pseudomonas in 1 of 2 bottles. patient continues to deny complaints.  Review of ER records reveals that patient did have a few episodes of diarrhea a couple days before his presentation to the ER yesterday which has since subsided.  He denies abdominal pain, nausea vomiting or change in bowel habits.  He denies cough or shortness of breath and is up-to-date with his Covid vaccine which she received a month ago.  ED Course: In the emergency room he had a low-grade temperature of 99.1 with otherwise normal vitals.  WBC was 11,000 today compared to 6600 yesterday.  Lactic acid now down to 1.1 from 2.9>>2.2>>1.9 on his previous visit.  Creatinine 1.56, up from baseline of 1.2.  Chest x-ray done yesterday showed no acute intrathoracic process and was not repeated.  Urinalysis was clean.  Procalcitonin was elevated at 0.34 yesterday.  Patient was started on Zosyn.  Hospitalist consulted for admission  Review of Systems: As per HPI otherwise 10 point review of systems negative.    Past Medical History:  Diagnosis Date  . Anxiety   . Asthma   . BPH (benign prostatic hypertrophy) 2017  . Bronchiectasis ([place])   . COPD (chronic obstructive pulmonary disease) ([place])   . Coronary artery disease   .  Dysrhythmia    asymptomatic pvcs  . GERD (gastroesophageal reflux disease)   . History of gout    several years ago  . HOH (hard of hearing)    Bilateral Hearing Aids  . Hyperlipidemia   . Hypertension   . Hypothyroidism   . Myocardial infarction ([place]) 1985  . Presence of permanent cardiac pacemaker   . Sleep apnea    OSA--C-PAP  . 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 Squamous cell carcinoma of arm, left 09/10/2017   L mid dorsum forearm   . Squamous cell carcinoma of arm, right 06/01/2014   R prox forearm   . Squamous cell carcinoma of hand, right 04/06/2018   R hand thumb webspace   . Squamous cell carcinoma of leg, left 05/24/2018   L mid lat pretibial   . Squamous cell carcinoma of leg, left 05/24/2018   L mid ant med thigh   . Squamous cell carcinoma of leg, left 05/24/2018   L med mid calf  . Squamous cell carcinoma of leg, right 01/16/2015   R lat calf  . Squamous cell carcinoma of skin 01/27/2013   R post forearm  . Squamous cell carcinoma, arm, right 04/27/2013   R prox dorsum forearm     Past Surgical History:  Procedure Laterality Date  . APPENDECTOMY    . COLONOSCOPY WITH PROPOFOL N/A 05/05/2017   Procedure: COLONOSCOPY WITH PROPOFOL;  Surgeon: Manya Silvas, MD;  Location: [facility] ENDOSCOPY;  Service: Endoscopy;  Laterality: N/A;  . CORONARY ANGIOPLASTY     x 2  . CORONARY ARTERY BYPASS GRAFT  1985  . INGUINAL HERNIA REPAIR Right 11/20/2015   Procedure: HERNIA REPAIR INGUINAL ADULT;  Surgeon: [name], MD;  Location: [facility] ORS;  Service: General;  Laterality: Right;  . INSERT / REPLACE / REMOVE PACEMAKER    . PACEMAKER INSERTION Left 10/15/2016   Procedure: INSERTION PACEMAKER;  Surgeon: Isaias Cowman, MD;  Location: [facility] ORS;  Service: Cardiovascular;  Laterality: Left;  . PACEMAKER LEAD REMOVAL N/A 11/19/2016   Procedure: LEAD REVISION;  Surgeon: Isaias Cowman, MD;  Location: [facility] ORS;  Service:  Cardiovascular;  Laterality: N/A;     reports that he has never smoked. He has never used smokeless tobacco. He reports that he does not drink alcohol or use drugs.  Allergies  Allergen Reactions  . Procardia [Nifedipine] Other (See Comments)    Other reaction(s): Unknown dizzy Gets very woozy with this medication    Family History  Problem Relation Age of Onset  . Diabetes Mother      Prior to Admission medications   Medication Sig Start Date End Date Taking? Authorizing Provider  amLODipine (NORVASC) 10 MG tablet Take 10 mg by mouth daily.   Yes [provider]  apixaban (ELIQUIS) 5 MG TABS tablet Take 5 mg by mouth 2 (two) times daily.   Yes [provider]  atorvastatin (LIPITOR) 20 MG tablet TAKE ONE (1) TABLET BY MOUTH EVERY DAY 09/05/15  Yes [provider]  budesonide (PULMICORT) 180 MCG/ACT inhaler Inhale 1 puff into the lungs daily.    Yes [provider]  fluticasone (FLONASE) 50 MCG/ACT nasal spray instill 2 sprays into each nostril once daily 10/06/16  Yes [provider]  isosorbide mononitrate (IMDUR) 30 MG 24 hr tablet Take 30 mg by mouth daily.  09/07/14  Yes [provider]  NON FORMULARY    Yes [provider]  tamsulosin (FLOMAX) 0.4 MG CAPS capsule Take 1 capsule (0.4 mg total) by mouth 2 (two) times daily. 08/03/18  Yes Stoioff, Ronda Fairly, MD  ibuprofen (ADVIL,MOTRIN) 200 MG tablet Take 400 mg by mouth every 8 (eight) hours as needed for headache or mild pain.    [provider]  ketoconazole (NIZORAL) 2 % cream Apply to the feet and between toes QHS Patient not taking: Reported on 07/06/2019 06/13/19   Ralene Bathe, MD  meloxicam (MOBIC) 7.5 MG tablet TAKE 1 TABLET(7.5 MG) BY MOUTH EVERY DAY 09/03/17   [provider]  montelukast (SINGULAIR) 10 MG tablet Take 10 mg by mouth daily.     [provider]  Multiple Vitamin (MULTIVITAMIN) capsule Take 1 capsule by mouth daily.     [provider]  nitroGLYCERIN (NITROSTAT) 0.4 MG SL tablet Place 0.4 mg under the tongue every 5 (five) minutes as needed for chest pain.    [provider]  omeprazole (PRILOSEC) 20 MG capsule TAKE ONE (1) CAPSULE EACH DAY 09/05/15   [provider]  vitamin C (ASCORBIC ACID) 500 MG tablet Take 500 mg by mouth daily.    [provider]    Physical Exam: Vitals:   07/06/19 2303 07/06/19 2330 07/07/19 0045 07/07/19 0105  BP: 130/69 128/73  109/71  Pulse: 66 (!) 46 63 60  Resp: 16  20 20   Temp: 97.9 F (36.6 C)     TempSrc: Oral     SpO2: 96% 94% 94%  94%     Vitals:   07/06/19 2303 07/06/19 2330 07/07/19 0045 07/07/19 0105  BP: 130/69 128/73  109/71  Pulse: 66 (!) 46 63 60  Resp: 16  20 20   Temp: 97.9 F (36.6 C)     TempSrc: Oral     SpO2: 96% 94% 94% 94%    Constitutional: Alert and awake, oriented x3, not in any acute distress. Eyes: PERLA, EOMI, irises appear normal, anicteric sclera,  ENMT: external ears and nose appear normal, normal hearing             Lips appears normal, oropharynx mucosa, tongue, posterior pharynx appear normal  Neck: neck appears normal, no masses, normal ROM, no thyromegaly, no JVD  CVS: S1-S2 clear, no murmur rubs or gallops,  , no carotid bruits, pedal pulses palpable, No LE edema Respiratory:  clear to auscultation bilaterally, no wheezing, rales or rhonchi. Respiratory effort normal. No accessory muscle use.  Abdomen: soft nontender, nondistended, normal bowel sounds, no hepatosplenomegaly, no hernias Musculoskeletal: : no cyanosis, clubbing , no contractures or atrophy Neuro: Cranial nerves II-XII intact, sensation, reflexes normal, strength Psych: judgement and insight appear normal, stable mood and affect,  Skin: no rashes or lesions or ulcers, no induration or nodules   Labs on Admission: I have personally reviewed following labs and imaging studies  CBC: Recent Labs  Lab 07/05/19 1452  07/06/19 2342  WBC 6.6 11.0*  HGB 16.4 14.9  HCT 47.7 44.8  MCV 88.0 91.6  PLT 182 0000000*   Basic Metabolic Panel: Recent Labs  Lab 07/05/19 1452 07/06/19 2342  NA 142 140  K 4.2 3.3*  CL 109 109  CO2 23 25  GLUCOSE 111* 111*  BUN 21 24*  CREATININE 1.33* 1.56*  CALCIUM 9.3 8.3*   GFR: Estimated Creatinine Clearance: 37.1 mL/min (A) (by C-G formula based on SCr of 1.56 mg/dL (H)). Liver Function Tests: Recent Labs  Lab 07/05/19 1452 07/06/19 2342  AST 22 22  ALT 19 20  ALKPHOS 90 66  BILITOT 1.5* 1.1  PROT 6.8 5.7*  ALBUMIN 4.4 3.5   No results for input(s): LIPASE, AMYLASE in the last 168 hours. No results for input(s): AMMONIA in the last 168 hours. Coagulation Profile: Recent Labs  Lab 07/06/19 2357  INR 2.2*   Cardiac Enzymes: No results for input(s): CKTOTAL, CKMB, CKMBINDEX, TROPONINI in the last 168 hours. BNP (last 3 results) No results for input(s): PROBNP in the last 8760 hours. HbA1C: No results for input(s): HGBA1C in the last 72 hours. CBG: No results for input(s): GLUCAP in the last 168 hours. Lipid Profile: No results for input(s): CHOL, HDL, LDLCALC, TRIG, CHOLHDL, LDLDIRECT in the last 72 hours. Thyroid Function Tests: No results for input(s): TSH, T4TOTAL, FREET4, T3FREE, THYROIDAB in the last 72 hours. Anemia Panel: No results for input(s): VITAMINB12, FOLATE, FERRITIN, TIBC, IRON, RETICCTPCT in the last 72 hours. Urine analysis:    Component Value Date/Time   COLORURINE YELLOW (A) 07/05/2019 1456   APPEARANCEUR HAZY (A) 07/05/2019 1456   APPEARANCEUR Clear 06/11/2017 1428   LABSPEC 1.015 07/05/2019 1456   PHURINE 5.0 07/05/2019 1456   GLUCOSEU NEGATIVE 07/05/2019 1456   HGBUR NEGATIVE 07/05/2019 1456   BILIRUBINUR NEGATIVE 07/05/2019 1456   BILIRUBINUR Negative 06/11/2017 1428   KETONESUR NEGATIVE 07/05/2019 1456   PROTEINUR NEGATIVE 07/05/2019 1456   NITRITE NEGATIVE 07/05/2019 [place] 07/05/2019 1456     Radiological Exams on Admission: DG Chest 2 View  Result Date: 07/05/2019  CLINICAL DATA:  Is fever EXAM: CHEST - 2 VIEW COMPARISON:  11/13/2016 FINDINGS: Left pacer remains in place, unchanged. Prior CABG. Tortuous thoracic aorta with calcifications. Heart is normal size. Scarring at the right lung base. No confluent opacities or effusions. No acute bony abnormality. IMPRESSION: No active cardiopulmonary disease. Electronically Signed   By: Rolm Baptise M.D.   On: 07/05/2019 15:29    EKG: Independently reviewed.   Assessment/Plan Principal Problem:   Bacteremia due to Pseudomonas -Patient presented with borderline sepsis criteria yesterday but without localizing signs, received IV Vanco and ceftriaxone and discharged as he did not want to be admitted -Returns today with improvement in lactic acid but WBC up, low-grade temperature, Pseudomonas in 1 of 2 bottles.  Still no localizing signs of infection -Patient did have more removal at dermatology 2 to 3 weeks prior but they have healed well -Continue IV Zosyn started in the emergency room -We will keep n.p.o. for possible TEE in the a.m. -Follow cultures    AKI (acute kidney injury) ([place]) -IV hydration, monitor renal function and avoid nephrotoxins    Chronic obstructive pulmonary disease ([place]) -DuoNebs as needed and resume home COPD inhalers    CAD (coronary artery disease), autologous vein bypass graft -No complaints of chest pain and EKG nonacute -Continue Imdur and statins.  On Eliquis    Sleep apnea with use of continuous positive airway pressure (CPAP) -Continue home CPAP    Atrial fibrillation (HCC) -Rate controlled to not on rate control agents, pending med rec -Continue Eliquis for stroke prevention    Status post placement of cardiac pacemaker -No acute disease suspected    History of removal of skin mole -Does not appear to be infected.  Right forearm    DVT prophylaxis: Lovenox  Code Status: full code   Family Communication:  none  Disposition Plan: Back to previous home environment Consults called: none  Status:obs    Athena Masse MD [facility]     07/07/2019, 1:20 AM

## 2019-07-07 NOTE — Progress Notes (Signed)
Pt stated that he has CPAP at home and may get wife to bring in. Joshua Frye refused to wear ours and said he would call if he thinks he needs unit.

## 2019-07-07 NOTE — Progress Notes (Signed)
PROGRESS NOTE    Joshua Frye  [id number] DOB: January 06, 1936 DOA: 07/06/2019 PCP: [name], MD      Brief Narrative:  Joshua Frye is a 84 y.o. M with hx CAD s/p CABG in 1987, pacer, HTN, COPD not on O2, Afib on Eliquis, OSA on CPAP who presented with fever, chills and abnormal blood culture.  Patient initially developed symptoms day prior to admission, fever, chills.  Seen in ER, noted to have lactate 2.9, bilirubin 1.5, CXR clear, but otherwise appeared benign, patient preferred to be discharged home.  1/2 Blood cultures returned positive for Pseudomonas by BCID, so patient was called back.    In the ER on return visit, afebrile, HR normal, respirations and BP normal, pulse ox normal.  Started on Zosyn and admitted.      Assessment & Plan:  Pseudomonas bacteremia Patient found to have 1/4 blood cultures with BCID detection of pseudomonal bacteremia.    No clear source.  Chest x-ray yesterday normal.  Tbili 1.5 yesterday, today normal.  Has had no recent procedures or illnesses. No urinary symptoms, no GI symptoms.   Only possible RF for pseudomonas bactermia is pacer.   -Continue Zosyn -Consult ID  -Repeat cultures pending -Follow growth and speciation of original culture    Hyperbilirubinemia Minimal elevation in Tbili yesterday -Trend LFTs -Obtain RUQ [place]  Coronary disease, secondary prevention Hypertension BP normal -Continue atorvastatin -Continue Imdur -Hold amlodipine  Atrial fibrillation Rate normal -Continue Eliquis  Sleep apnea -CPAP at night  COPD No active disease -Hold Pulmicort  CKD IIIa Baseline Cr 1.3.  Today up to 1.5.  BPH -Continue Flomax          Disposition: Status is: Inpatient  Remains inpatient appropriate because:he has bacteremia with pseudomonas, and the hospital is the appropriate level of care for this severity of illness   Dispo: The patient is from: Home              Anticipated d/c is to: Home               Anticipated d/c date is: TBD              Patient currently is not medically stable to d/c.              MDM: This is a no charge note.  For further details, please see H&P by my partner Dr. Damita Dunnings from earlier today.  The below labs and imaging reports were reviewed and summarized above.    DVT prophylaxis: N/A on Eliquis Code Status: FULL Family Communication:     Consultants:   ID  Procedures:     Antimicrobials:   Zosyn 5/11 >>   Culture data:   5/11 blood culture 1/2 with pseudomonas by BCID  5/12 blood culture x2 -- NGTD           Subjective: Patient had no fever overnight.  He feels well.        Objective: Vitals:   07/07/19 0630 07/07/19 1100 07/07/19 1200 07/07/19 1230  BP: 132/78 137/79 (!) 156/84 (!) 143/78  Pulse: 60 60 (!) 55 (!) 55  Resp: 12 14 (!) 24 15  Temp:      TempSrc:      SpO2: 94% 96% 94% 94%    Intake/Output Summary (Last 24 hours) at 07/07/2019 1328 Last data filed at 07/07/2019 1108 Gross per 24 hour  Intake --  Output 375 ml  Net -375 ml   There were  no vitals filed for this visit.  Examination: The patient was seen and examined.      Data Reviewed: I have personally reviewed following labs and imaging studies:  CBC: Recent Labs  Lab 07/05/19 1452 07/06/19 2342  WBC 6.6 11.0*  HGB 16.4 14.9  HCT 47.7 44.8  MCV 88.0 91.6  PLT 182 0000000*   Basic Metabolic Panel: Recent Labs  Lab 07/05/19 1452 07/06/19 2342  NA 142 140  K 4.2 3.3*  CL 109 109  CO2 23 25  GLUCOSE 111* 111*  BUN 21 24*  CREATININE 1.33* 1.56*  CALCIUM 9.3 8.3*   GFR: Estimated Creatinine Clearance: 37.1 mL/min (A) (by C-G formula based on SCr of 1.56 mg/dL (H)). Liver Function Tests: Recent Labs  Lab 07/05/19 1452 07/06/19 2342  AST 22 22  ALT 19 20  ALKPHOS 90 66  BILITOT 1.5* 1.1  PROT 6.8 5.7*  ALBUMIN 4.4 3.5   No results for input(s): LIPASE, AMYLASE in the last 168 hours. No results for input(s):  AMMONIA in the last 168 hours. Coagulation Profile: Recent Labs  Lab 07/06/19 2357  INR 2.2*   Cardiac Enzymes: No results for input(s): CKTOTAL, CKMB, CKMBINDEX, TROPONINI in the last 168 hours. BNP (last 3 results) No results for input(s): PROBNP in the last 8760 hours. HbA1C: No results for input(s): HGBA1C in the last 72 hours. CBG: No results for input(s): GLUCAP in the last 168 hours. Lipid Profile: No results for input(s): CHOL, HDL, LDLCALC, TRIG, CHOLHDL, LDLDIRECT in the last 72 hours. Thyroid Function Tests: No results for input(s): TSH, T4TOTAL, FREET4, T3FREE, THYROIDAB in the last 72 hours. Anemia Panel: No results for input(s): VITAMINB12, FOLATE, FERRITIN, TIBC, IRON, RETICCTPCT in the last 72 hours. Urine analysis:    Component Value Date/Time   COLORURINE YELLOW (A) 07/05/2019 1456   APPEARANCEUR HAZY (A) 07/05/2019 1456   APPEARANCEUR Clear 06/11/2017 1428   LABSPEC 1.015 07/05/2019 1456   PHURINE 5.0 07/05/2019 1456   GLUCOSEU NEGATIVE 07/05/2019 1456   HGBUR NEGATIVE 07/05/2019 [place] 07/05/2019 1456   BILIRUBINUR Negative 06/11/2017 1428   KETONESUR NEGATIVE 07/05/2019 1456   PROTEINUR NEGATIVE 07/05/2019 1456   NITRITE NEGATIVE 07/05/2019 1456   LEUKOCYTESUR NEGATIVE 07/05/2019 1456   Sepsis Labs: @LABRCNTIP (procalcitonin:4,lacticacidven:4)  ) Recent Results (from the past 240 hour(s))  Blood culture (single)     Status: None (Preliminary result)   Collection Time: 07/05/19  2:52 PM   Specimen: BLOOD  Result Value Ref Range Status   Specimen Description BLOOD RAC  Final   Special Requests   Final    BOTTLES DRAWN AEROBIC AND ANAEROBIC Blood Culture results may not be optimal due to an excessive volume of blood received in culture bottles   Culture  Setup Time   Final    Organism ID to follow [place] Performed at [facility], [address] [place], [place] [address]     Culture GRAM NEGATIVE RODS  Final   Report Status PENDING  Incomplete  Blood Culture ID Panel (Reflexed)     Status: Abnormal   Collection Time: 07/05/19  2:52 PM  Result Value Ref Range Status   Enterococcus species NOT DETECTED NOT DETECTED Final   Listeria monocytogenes NOT DETECTED NOT DETECTED Final   Staphylococcus species NOT DETECTED NOT DETECTED Final   Staphylococcus aureus (BCID) NOT DETECTED NOT DETECTED Final   Streptococcus species NOT DETECTED NOT DETECTED Final   Streptococcus agalactiae NOT DETECTED  NOT DETECTED Final   Streptococcus pneumoniae NOT DETECTED NOT DETECTED Final   Streptococcus pyogenes NOT DETECTED NOT DETECTED Final   Acinetobacter baumannii NOT DETECTED NOT DETECTED Final   Enterobacteriaceae species NOT DETECTED NOT DETECTED Final   Enterobacter cloacae complex NOT DETECTED NOT DETECTED Final   Escherichia coli NOT DETECTED NOT DETECTED Final   Klebsiella oxytoca NOT DETECTED NOT DETECTED Final   Klebsiella pneumoniae NOT DETECTED NOT DETECTED Final   Proteus species NOT DETECTED NOT DETECTED Final   Serratia marcescens NOT DETECTED NOT DETECTED Final   Carbapenem resistance NOT DETECTED NOT DETECTED Final   Haemophilus influenzae NOT DETECTED NOT DETECTED Final   Neisseria meningitidis NOT DETECTED NOT DETECTED Final   Pseudomonas aeruginosa DETECTED (A) NOT DETECTED Final    Comment: CRITICAL RESULT CALLED TO, READ BACK BY AND VERIFIED WITH: JASON ROBBINS ON 07/06/19 AT 2054 AKT    Candida albicans NOT DETECTED NOT DETECTED Final   Candida glabrata NOT DETECTED NOT DETECTED Final   Candida krusei NOT DETECTED NOT DETECTED Final   Candida parapsilosis NOT DETECTED NOT DETECTED Final   Candida tropicalis NOT DETECTED NOT DETECTED Final    Comment: Performed at [facility], [address] [place], [place] [address]  Blood culture (single)     Status: None (Preliminary result)   Collection Time: 07/05/19  5:20 PM   Specimen: BLOOD    Result Value Ref Range Status   Specimen Description BLOOD BLOOD RIGHT WRIST  Final   Special Requests   Final    BOTTLES DRAWN AEROBIC AND ANAEROBIC Blood Culture results may not be optimal due to an excessive volume of blood received in culture bottles   Culture   Final    NO GROWTH 2 DAYS Performed at [facility], [address] [place], [place] [address]    Report Status PENDING  Incomplete  SARS Coronavirus 2 by RT PCR (hospital order, performed in [place] hospital lab) Nasopharyngeal Nasopharyngeal Swab     Status: None   Collection Time: 07/05/19  6:31 PM   Specimen: Nasopharyngeal Swab  Result Value Ref Range Status   SARS Coronavirus 2 NEGATIVE NEGATIVE Final    Comment: (NOTE) SARS-CoV-2 target nucleic acids are NOT DETECTED. The SARS-CoV-2 RNA is generally detectable in upper and lower respiratory specimens during the acute phase of infection. The lowest concentration of SARS-CoV-2 viral copies this assay can detect is 250 copies / mL. A negative result does not preclude SARS-CoV-2 infection and should not be used as the sole basis for treatment or other patient management decisions.  A negative result may occur with improper specimen collection / handling, submission of specimen other than nasopharyngeal swab, presence of viral mutation(s) within the areas targeted by this assay, and inadequate number of viral copies (<250 copies / mL). A negative result must be combined with clinical observations, patient history, and epidemiological information. Fact Sheet for Patients:   StrictlyIdeas.no Fact Sheet for Healthcare Providers: BankingDealers.co.za This test is not yet approved or cleared  by the [place] FDA and has been authorized for detection and/or diagnosis of SARS-CoV-2 by FDA under an Emergency Use Authorization (EUA).  This EUA will remain in effect (meaning this test can be used) for the  duration of the COVID-19 declaration under Section 564(b)(1) of the Act, 21 U.S.C. section 360bbb-3(b)(1), unless the authorization is terminated or revoked sooner. Performed at [facility], [address], [place], [place] [address]   Blood culture (routine x 2)  Status: None (Preliminary result)   Collection Time: 07/06/19 11:41 PM   Specimen: Right Antecubital; Blood  Result Value Ref Range Status   Specimen Description RIGHT ANTECUBITAL  Final   Special Requests   Final    BOTTLES DRAWN AEROBIC AND ANAEROBIC Blood Culture adequate volume   Culture   Final    NO GROWTH < 12 HOURS Performed at [facility], [address] [place], [place] [address]    Report Status PENDING  Incomplete  Blood culture (routine x 2)     Status: None (Preliminary result)   Collection Time: 07/06/19 11:46 PM   Specimen: BLOOD LEFT FOREARM  Result Value Ref Range Status   Specimen Description BLOOD LEFT FOREARM  Final   Special Requests   Final    BOTTLES DRAWN AEROBIC AND ANAEROBIC Blood Culture adequate volume   Culture   Final    NO GROWTH < 12 HOURS Performed at [facility], [address] [place], [place] [address]    Report Status PENDING  Incomplete         Radiology Studies: DG Chest 2 View  Result Date: 07/05/2019 CLINICAL DATA:  Is fever EXAM: CHEST - 2 VIEW COMPARISON:  11/13/2016 FINDINGS: Left pacer remains in place, unchanged. Prior CABG. Tortuous thoracic aorta with calcifications. Heart is normal size. Scarring at the right lung base. No confluent opacities or effusions. No acute bony abnormality. IMPRESSION: No active cardiopulmonary disease. Electronically Signed   By: Rolm Baptise M.D.   On: 07/05/2019 15:29        Scheduled Meds: . apixaban  5 mg Oral BID  . atorvastatin  40 mg Oral Daily  . budesonide  1 puff Inhalation Daily  . fluticasone  2 spray Each Nare Daily  . isosorbide mononitrate  30 mg Oral Daily  . potassium  chloride  40 mEq Oral Once   Continuous Infusions: . sodium chloride 75 mL/hr at 07/07/19 1108  . piperacillin-tazobactam (ZOSYN)  IV Stopped (07/07/19 1217)     LOS: 0 days    Time spent: 20 minutes    Edwin Dada, MD [facility] 07/07/2019, 1:28 PM     Please page though [place] or Epic secure chat:  For password, contact charge nurse

## 2019-07-07 NOTE — Progress Notes (Signed)
Pharmacy Antibiotic Note  Joshua Frye is a 84 y.o. male admitted on 07/06/2019 with bacteremia.  Pharmacy has been consulted for Zosyn dosing.  Plan: Zosyn 3.375g IV q8h (4 hour infusion).     Temp (24hrs), Avg:97.9 F (36.6 C), Min:97.9 F (36.6 C), Max:97.9 F (36.6 C)  Recent Labs  Lab 07/05/19 1452 07/05/19 1720 07/05/19 1859 07/06/19 2342 07/06/19 2357  WBC 6.6  --   --  11.0*  --   CREATININE 1.33*  --   --  1.56*  --   LATICACIDVEN 2.9* 2.2* 1.9  --  1.1    Estimated Creatinine Clearance: 37.1 mL/min (A) (by C-G formula based on SCr of 1.56 mg/dL (H)).    Allergies  Allergen Reactions  . Procardia [Nifedipine] Other (See Comments)    Other reaction(s): Unknown dizzy Gets very woozy with this medication    Antimicrobials this admission:   >>    >>   Dose adjustments this admission:   Microbiology results:  BCx:   UCx:    Sputum:    MRSA PCR:   Thank you for allowing pharmacy to be a part of this patient's care.  Hart Robinsons A 07/07/2019 1:27 AM

## 2019-07-07 NOTE — Consult Note (Signed)
NAME: Joshua Frye  DOB: [birth year]  MRN: [id number]  Date/Time: 07/07/2019 4:25 PM  REQUESTING PROVIDER: Dr. Loleta Books Subjective:  REASON FOR CONSULT: Pseudomonas bacteremia ?History from patient and chart reviewed Joshua Frye is a 84 y.o. male with a history of COPD, CAD, S/P CABG and pacemaker , paroxysmal afib admitted with sudden onset chills and rigors. As per patient yesterday he had a routine appointment with his PCP and he was trying to get ready to go to the appointment when he started having sudden onset of chills and rigors and he could not stop shaking.  He was by himself at home and hence he called EMS and he came to the hospital. In the ED temperature was 101.2, BP 118/53, heart rate of 72, pulse ox of 96%. Patient denies cough, shortness of breath, chest pain, headache, nausea, vomiting, diarrhea, abdominal pain, dysuria, skin lesions. Patient's blood cultures came positive was for Pseudomonas and I am seeing the patient for the same. Patient had seen his dermatologist last month and had some cryotherapy and shaving of lesions but they have all healed very well Patient plays golf occasionally.  He walks 1/2 miles every day. He has a dog.  No dog bites. He has a pacemaker for the past 2 years. He is fully vaccinated for Covid He is not taking any antibiotics recently No travel history No sick contact  Past Medical History:  Diagnosis Date  . Anxiety   . Asthma   . BPH (benign prostatic hypertrophy) 2017  . Bronchiectasis ([place])   . COPD (chronic obstructive pulmonary disease) ([place])   . Coronary artery disease   . Dysrhythmia    asymptomatic pvcs  . GERD (gastroesophageal reflux disease)   . History of gout    several years ago  . HOH (hard of hearing)    Bilateral Hearing Aids  . Hyperlipidemia   . Hypertension   . Hypothyroidism   . Myocardial infarction ([place]) 1985  . Presence of permanent cardiac pacemaker   . Sleep apnea    OSA--C-PAP  . 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 Squamous cell carcinoma of arm, left 09/10/2017   L mid dorsum forearm   . Squamous cell carcinoma of arm, right 06/01/2014   R prox forearm   . Squamous cell carcinoma of hand, right 04/06/2018   R hand thumb webspace   . Squamous cell carcinoma of leg, left 05/24/2018   L mid lat pretibial   . Squamous cell carcinoma of leg, left 05/24/2018   L mid ant med thigh   . Squamous cell carcinoma of leg, left 05/24/2018   L med mid calf  . Squamous cell carcinoma of leg, right 01/16/2015   R lat calf  . Squamous cell carcinoma of skin 01/27/2013   R post forearm  . Squamous cell carcinoma, arm, right 04/27/2013   R prox dorsum forearm     Past Surgical History:  Procedure Laterality Date  . APPENDECTOMY    . COLONOSCOPY WITH PROPOFOL N/A 05/05/2017   Procedure: COLONOSCOPY WITH PROPOFOL;  Surgeon: Manya Silvas, MD;  Location: [facility] ENDOSCOPY;  Service: Endoscopy;  Laterality: N/A;  . CORONARY ANGIOPLASTY     x 2  . CORONARY ARTERY BYPASS GRAFT  1985  . INGUINAL HERNIA REPAIR Right 11/20/2015   Procedure: HERNIA REPAIR INGUINAL ADULT;  Surgeon: [name], MD;  Location: [facility] ORS;  Service: General;  Laterality: Right;  . INSERT /  REPLACE / REMOVE PACEMAKER    . PACEMAKER INSERTION Left 10/15/2016   Procedure: INSERTION PACEMAKER;  Surgeon: Isaias Cowman, MD;  Location: [facility] ORS;  Service: Cardiovascular;  Laterality: Left;  . PACEMAKER LEAD REMOVAL N/A 11/19/2016   Procedure: LEAD REVISION;  Surgeon: Isaias Cowman, MD;  Location: [facility] ORS;  Service: Cardiovascular;  Laterality: N/A;    Social History   Socioeconomic History  . Marital status: Married    Spouse name: Not on file  . Number of children: Not on file  . Years of education: Not on file  . Highest education level: Not on file  Occupational History  . Not on file  Tobacco Use  . Smoking status: Never Smoker  . Smokeless tobacco: Never Used    Substance and Sexual Activity  . Alcohol use: No  . Drug use: No  . Sexual activity: Yes  Other Topics Concern  . Not on file  Social History Narrative  . Not on file   Social Determinants of Health   Financial Resource Strain:   . Difficulty of Paying Living Expenses:   Food Insecurity:   . Worried About Charity fundraiser in the Last Year:   . Arboriculturist in the Last Year:   Transportation Needs:   . Film/video editor (Medical):   [name] Lack of Transportation (Non-Medical):   Physical Activity:   . Days of Exercise per Week:   . Minutes of Exercise per Session:   Stress:   . Feeling of Stress :   Social Connections:   . Frequency of Communication with Friends and Family:   . Frequency of Social Gatherings with Friends and Family:   . Attends Religious Services:   . Active Member of Clubs or Organizations:   . Attends Archivist Meetings:   [name] Marital Status:   Intimate Partner Violence:   . Fear of Current or Ex-Partner:   . Emotionally Abused:   [name] Physically Abused:   . Sexually Abused:     Family History  Problem Relation Age of Onset  . Diabetes Mother    Allergies  Allergen Reactions  . Procardia [Nifedipine] Other (See Comments)    Other reaction(s): Unknown dizzy Gets very woozy with this medication   I ? Current Facility-Administered Medications  Medication Dose Route Frequency Provider Last Rate Last Admin  . 0.45 % sodium chloride infusion   Intravenous Continuous Athena Masse, MD 75 mL/hr at 07/07/19 1108 Rate Change at 07/07/19 1108  . acetaminophen (TYLENOL) tablet 650 mg  650 mg Oral Q6H PRN Athena Masse, MD       Or  . acetaminophen (TYLENOL) suppository 650 mg  650 mg Rectal Q6H PRN Athena Masse, MD      . apixaban Arne Cleveland) tablet 5 mg  5 mg Oral BID Athena Masse, MD   5 mg at 07/07/19 1106  . atorvastatin (LIPITOR) tablet 40 mg  40 mg Oral Daily Athena Masse, MD   40 mg at 07/07/19 1106  . budesonide  (PULMICORT) 180 MCG/ACT inhaler 1 puff  1 puff Inhalation Daily Athena Masse, MD   1 puff at 07/07/19 1106  . fluticasone (FLONASE) 50 MCG/ACT nasal spray 2 spray  2 spray Each Nare Daily Athena Masse, MD   2 spray at 07/07/19 1106  . HYDROcodone-acetaminophen (NORCO/VICODIN) 5-325 MG per tablet 1-2 tablet  1-2 tablet Oral Q4H PRN Athena Masse, MD      . isosorbide  mononitrate (IMDUR) 24 hr tablet 30 mg  30 mg Oral Daily Judd Gaudier V, MD   30 mg at 07/07/19 1105  . nitroGLYCERIN (NITROSTAT) SL tablet 0.4 mg  0.4 mg Sublingual Q5 min PRN Athena Masse, MD      . ondansetron [facility]) tablet 4 mg  4 mg Oral Q6H PRN Athena Masse, MD       Or  . ondansetron [facility]) injection 4 mg  4 mg Intravenous Q6H PRN Athena Masse, MD      . piperacillin-tazobactam (ZOSYN) IVPB 3.375 g  3.375 g Intravenous Q8H Ena Dawley, RPH   Stopped at 07/07/19 1217     Abtx:  Anti-infectives (From admission, onward)   Start     Dose/Rate Route Frequency Ordered Stop   07/07/19 0800  piperacillin-tazobactam (ZOSYN) IVPB 3.375 g     3.375 g 12.5 mL/hr over 240 Minutes Intravenous Every 8 hours 07/07/19 0126     07/07/19 0000  piperacillin-tazobactam (ZOSYN) IVPB 3.375 g     3.375 g 100 mL/hr over 30 Minutes Intravenous  Once 07/06/19 2356 07/07/19 0055      REVIEW OF SYSTEMS:  Const:  fever,  chills, negative weight loss Eyes: negative diplopia or visual changes, negative eye pain ENT: negative coryza, negative sore throat Resp: negative cough, hemoptysis, dyspnea Cards: negative for chest pain, palpitations, lower extremity edema GU: negative for frequency, dysuria and hematuria GI: Negative for abdominal pain, diarrhea, bleeding, constipation Skin: negative for rash and pruritus Heme: negative for easy bruising and gum/nose bleeding MS: negative for myalgias, arthralgias, back pain and muscle weakness Neurolo:negative for headaches, dizziness, vertigo, memory problems  Psych: negative for  feelings of anxiety, depression  Endocrine: negative for thyroid, diabetes Allergy/Immunology-as above  Objective:  VITALS:  BP (!) 151/72 (BP Location: Right Arm)   Pulse (!) 58   Temp 98.1 F (36.7 C) (Oral)   Resp 17   SpO2 94%  PHYSICAL EXAM:  General: Alert, cooperative, no distress, appears stated age.  Head: Normocephalic, without obvious abnormality, atraumatic. Eyes: Conjunctivae clear, anicteric sclerae. Pupils are equal ENT Nares normal. No drainage or sinus tenderness. Lips, mucosa, and tongue normal. No Thrush Neck: Supple, symmetrical, no adenopathy, thyroid: non tender no carotid bruit and no JVD. Back: No CVA tenderness. Lungs: Clear to auscultation bilaterally. No Wheezing or Rhonchi. No rales. Heart: Regular rate and rhythm, 2 x 6 systolic murmur, . Abdomen: Soft, non-tender,not distended. Bowel sounds normal. No masses Extremities: atraumatic, no cyanosis. No edema. No clubbing Skin: No rashes or lesions. Or bruising Lymph: Cervical, supraclavicular normal. Neurologic: Grossly non-focal Pertinent Labs Lab Results CBC    Component Value Date/Time   WBC 11.0 (H) 07/06/2019 2342   RBC 4.89 07/06/2019 2342   HGB 14.9 07/06/2019 2342   HCT 44.8 07/06/2019 2342   PLT 138 (L) 07/06/2019 2342   MCV 91.6 07/06/2019 2342   MCH 30.5 07/06/2019 2342   MCHC 33.3 07/06/2019 2342   RDW 14.7 07/06/2019 2342   LYMPHSABS 3.0 11/13/2016 1205   MONOABS 0.5 11/13/2016 1205   EOSABS 0.3 11/13/2016 1205   BASOSABS 0.0 11/13/2016 1205    CMP Latest Ref Rng & Units 07/06/2019 07/05/2019 11/13/2016  Glucose 70 - 99 mg/dL 111(H) 111(H) 113(H)  BUN 8 - 23 mg/dL 24(H) 21 19  Creatinine 0.61 - 1.24 mg/dL 1.56(H) 1.33(H) 1.27(H)  Sodium 135 - 145 mmol/L 140 142 141  Potassium 3.5 - 5.1 mmol/L 3.3(L) 4.2 4.2  Chloride 98 - 111  mmol/L 109 109 107  CO2 22 - 32 mmol/L 25 23 27   Calcium 8.9 - 10.3 mg/dL 8.3(L) 9.3 9.5  Total Protein 6.5 - 8.1 g/dL 5.7(L) 6.8 -  Total Bilirubin  0.3 - 1.2 mg/dL 1.1 1.5(H) -  Alkaline Phos 38 - 126 U/L 66 90 -  AST 15 - 41 U/L 22 22 -  ALT 0 - 44 U/L 20 19 -      Microbiology: Recent Results (from the past 240 hour(s))  Blood culture (single)     Status: None (Preliminary result)   Collection Time: 07/05/19  2:52 PM   Specimen: BLOOD  Result Value Ref Range Status   Specimen Description BLOOD RAC  Final   Special Requests   Final    BOTTLES DRAWN AEROBIC AND ANAEROBIC Blood Culture results may not be optimal due to an excessive volume of blood received in culture bottles   Culture  Setup Time   Final    Organism ID to follow [place] Performed at [facility], [address] [place], [place] [address]    Culture GRAM NEGATIVE RODS  Final   Report Status PENDING  Incomplete  Blood Culture ID Panel (Reflexed)     Status: Abnormal   Collection Time: 07/05/19  2:52 PM  Result Value Ref Range Status   Enterococcus species NOT DETECTED NOT DETECTED Final   Listeria monocytogenes NOT DETECTED NOT DETECTED Final   Staphylococcus species NOT DETECTED NOT DETECTED Final   Staphylococcus aureus (BCID) NOT DETECTED NOT DETECTED Final   Streptococcus species NOT DETECTED NOT DETECTED Final   Streptococcus agalactiae NOT DETECTED NOT DETECTED Final   Streptococcus pneumoniae NOT DETECTED NOT DETECTED Final   Streptococcus pyogenes NOT DETECTED NOT DETECTED Final   Acinetobacter baumannii NOT DETECTED NOT DETECTED Final   Enterobacteriaceae species NOT DETECTED NOT DETECTED Final   Enterobacter cloacae complex NOT DETECTED NOT DETECTED Final   Escherichia coli NOT DETECTED NOT DETECTED Final   Klebsiella oxytoca NOT DETECTED NOT DETECTED Final   Klebsiella pneumoniae NOT DETECTED NOT DETECTED Final   Proteus species NOT DETECTED NOT DETECTED Final   Serratia marcescens NOT DETECTED NOT DETECTED Final   Carbapenem resistance NOT DETECTED NOT DETECTED Final   Haemophilus influenzae NOT  DETECTED NOT DETECTED Final   Neisseria meningitidis NOT DETECTED NOT DETECTED Final   Pseudomonas aeruginosa DETECTED (A) NOT DETECTED Final    Comment: CRITICAL RESULT CALLED TO, READ BACK BY AND VERIFIED WITH: JASON ROBBINS ON 07/06/19 AT 2054 AKT    Candida albicans NOT DETECTED NOT DETECTED Final   Candida glabrata NOT DETECTED NOT DETECTED Final   Candida krusei NOT DETECTED NOT DETECTED Final   Candida parapsilosis NOT DETECTED NOT DETECTED Final   Candida tropicalis NOT DETECTED NOT DETECTED Final    Comment: Performed at [facility], [address] [place], [place] [address]  Blood culture (single)     Status: None (Preliminary result)   Collection Time: 07/05/19  5:20 PM   Specimen: BLOOD  Result Value Ref Range Status   Specimen Description BLOOD BLOOD RIGHT WRIST  Final   Special Requests   Final    BOTTLES DRAWN AEROBIC AND ANAEROBIC Blood Culture results may not be optimal due to an excessive volume of blood received in culture bottles   Culture   Final    NO GROWTH 2 DAYS Performed at [facility], [address] [place], [place] [address]    Report Status  PENDING  Incomplete  SARS Coronavirus 2 by RT PCR (hospital order, performed in [facility] hospital lab) Nasopharyngeal Nasopharyngeal Swab     Status: None   Collection Time: 07/05/19  6:31 PM   Specimen: Nasopharyngeal Swab  Result Value Ref Range Status   SARS Coronavirus 2 NEGATIVE NEGATIVE Final    Comment: (NOTE) SARS-CoV-2 target nucleic acids are NOT DETECTED. The SARS-CoV-2 RNA is generally detectable in upper and lower respiratory specimens during the acute phase of infection. The lowest concentration of SARS-CoV-2 viral copies this assay can detect is 250 copies / mL. A negative result does not preclude SARS-CoV-2 infection and should not be used as the sole basis for treatment or other patient management decisions.  A negative result may occur with improper specimen collection /  handling, submission of specimen other than nasopharyngeal swab, presence of viral mutation(s) within the areas targeted by this assay, and inadequate number of viral copies (<250 copies / mL). A negative result must be combined with clinical observations, patient history, and epidemiological information. Fact Sheet for Patients:   StrictlyIdeas.no Fact Sheet for Healthcare Providers: BankingDealers.co.za This test is not yet approved or cleared  by the [place] FDA and has been authorized for detection and/or diagnosis of SARS-CoV-2 by FDA under an Emergency Use Authorization (EUA).  This EUA will remain in effect (meaning this test can be used) for the duration of the COVID-19 declaration under Section 564(b)(1) of the Act, 21 U.S.C. section 360bbb-3(b)(1), unless the authorization is terminated or revoked sooner. Performed at [facility], [address] [place], [place] [address]   Blood culture (routine x 2)     Status: None (Preliminary result)   Collection Time: 07/06/19 11:41 PM   Specimen: Right Antecubital; Blood  Result Value Ref Range Status   Specimen Description RIGHT ANTECUBITAL  Final   Special Requests   Final    BOTTLES DRAWN AEROBIC AND ANAEROBIC Blood Culture adequate volume   Culture   Final    NO GROWTH < 12 HOURS Performed at [facility], [address], [place], [place] [address]    Report Status PENDING  Incomplete  Blood culture (routine x 2)     Status: None (Preliminary result)   Collection Time: 07/06/19 11:46 PM   Specimen: BLOOD LEFT FOREARM  Result Value Ref Range Status   Specimen Description BLOOD LEFT FOREARM  Final   Special Requests   Final    BOTTLES DRAWN AEROBIC AND ANAEROBIC Blood Culture adequate volume   Culture   Final    NO GROWTH < 12 HOURS Performed at [facility], [address] [place], [place] [address]    Report Status PENDING  Incomplete     IMAGING RESULTS:  I have personally reviewed the films ?No infiltrate   Impression/Recommendation ? ?Pseudomonas bacteremia.  Unclear source.  Sudden onset.  As he has a pacemaker he needs TEE. ?May need CT abdomen and pelvis  AKI on CKD  CAD status post CABG  Pacemaker for paroxysmal A. fib and sick sinus syndrome.  ___________________________________________________ Discussed with patient, requesting provider Note:  This document was prepared using Dragon voice recognition software and may include unintentional dictation errors.

## 2019-07-08 ENCOUNTER — Inpatient Hospital Stay: Payer: Self-pay

## 2019-07-08 ENCOUNTER — Encounter: Payer: Self-pay | Admitting: Family Medicine

## 2019-07-08 ENCOUNTER — Inpatient Hospital Stay: Payer: Medicare Other

## 2019-07-08 ENCOUNTER — Inpatient Hospital Stay
Admit: 2019-07-08 | Discharge: 2019-07-08 | Disposition: A | Discharge status: Home / Self Care | Payer: Medicare Other | Attending: Infectious Diseases | Admitting: Infectious Diseases

## 2019-07-08 LAB — HEPATIC FUNCTION PANEL
ALT: 33 U/L (ref 0–44)
AST: 34 U/L (ref 15–41)
Albumin: 3.4 g/dL — ABNORMAL LOW (ref 3.5–5.0)
Alkaline Phosphatase: 86 U/L (ref 38–126)
Bilirubin, Direct: 0.3 mg/dL — ABNORMAL HIGH (ref 0.0–0.2)
Indirect Bilirubin: 1.1 mg/dL — ABNORMAL HIGH (ref 0.3–0.9)
Total Bilirubin: 1.4 mg/dL — ABNORMAL HIGH (ref 0.3–1.2)
Total Protein: 5.9 g/dL — ABNORMAL LOW (ref 6.5–8.1)

## 2019-07-08 LAB — CBC
HCT: 44.9 % (ref 39.0–52.0)
Hemoglobin: 15 g/dL (ref 13.0–17.0)
MCH: 30.1 pg (ref 26.0–34.0)
MCHC: 33.4 g/dL (ref 30.0–36.0)
MCV: 90 fL (ref 80.0–100.0)
Platelets: 136 10*3/uL — ABNORMAL LOW (ref 150–400)
RBC: 4.99 MIL/uL (ref 4.22–5.81)
RDW: 14.5 % (ref 11.5–15.5)
WBC: 7.8 10*3/uL (ref 4.0–10.5)
nRBC: 0 % (ref 0.0–0.2)

## 2019-07-08 LAB — BASIC METABOLIC PANEL
Anion gap: 7 (ref 5–15)
BUN: 20 mg/dL (ref 8–23)
CO2: 23 mmol/L (ref 22–32)
Calcium: 8.6 mg/dL — ABNORMAL LOW (ref 8.9–10.3)
Chloride: 113 mmol/L — ABNORMAL HIGH (ref 98–111)
Creatinine, Ser: 1.25 mg/dL — ABNORMAL HIGH (ref 0.61–1.24)
GFR calc Af Amer: >60 mL/min (ref >60)
GFR calc non Af Amer: 54 mL/min — ABNORMAL LOW (ref >60)
Glucose, Bld: 103 mg/dL — ABNORMAL HIGH (ref 70–99)
Potassium: 4 mmol/L (ref 3.5–5.1)
Sodium: 143 mmol/L (ref 135–145)

## 2019-07-08 LAB — ECHOCARDIOGRAM COMPLETE

## 2019-07-08 LAB — PROCALCITONIN: Procalcitonin: 5.44 ng/mL

## 2019-07-08 MED ORDER — QUETIAPINE FUMARATE 25 MG PO TABS
25.0000 mg | ORAL_TABLET | Freq: Once | ORAL | Status: AC
  Administered 2019-07-09: 25 mg via ORAL

## 2019-07-08 MED ORDER — CHLORHEXIDINE GLUCONATE CLOTH 2 % EX PADS
6.0000 | MEDICATED_PAD | Freq: Every day | CUTANEOUS | Status: DC
  Administered 2019-07-09: 11:00:00 6 via TOPICAL

## 2019-07-08 MED ORDER — SODIUM CHLORIDE 0.9% FLUSH
10.0000 mL | Freq: Two times a day (BID) | INTRAVENOUS | Status: DC
  Administered 2019-07-08 – 2019-07-09 (×2): 10 mL

## 2019-07-08 MED ORDER — IOHEXOL 300 MG/ML  SOLN
100.0000 mL | Freq: Once | INTRAMUSCULAR | Status: AC | PRN
  Administered 2019-07-08: 100 mL via INTRAVENOUS

## 2019-07-08 MED ORDER — QUETIAPINE FUMARATE 25 MG PO TABS
25.0000 mg | ORAL_TABLET | Freq: Once | ORAL | Status: AC
  Administered 2019-07-08: 25 mg via ORAL
  Filled 2019-07-08: qty 1

## 2019-07-08 MED ORDER — SODIUM CHLORIDE 0.9% FLUSH: 10.0000 mL | INTRAVENOUS | Status: DC | PRN

## 2019-07-08 MED ORDER — SODIUM CHLORIDE 0.9 % IV SOLN
2.0000 g | Freq: Two times a day (BID) | INTRAVENOUS | Status: DC
  Administered 2019-07-08 – 2019-07-09 (×2): 2 g via INTRAVENOUS
  Filled 2019-07-08 (×4): qty 2

## 2019-07-08 MED ORDER — IOHEXOL 9 MG/ML PO SOLN
500.0000 mL | ORAL | Status: AC
  Administered 2019-07-08 (×2): 500 mL via ORAL

## 2019-07-08 NOTE — Progress Notes (Signed)
PHARMACY CONSULT NOTE FOR:  OUTPATIENT  PARENTERAL ANTIBIOTIC THERAPY (OPAT)  Indication: Pseudomonas Bacteremia Regimen: Cefepime 2g q12h End date: 07/21/19 (- may need more if TEE shows endocarditis per ID)  IV antibiotic discharge orders are pended. To discharging provider:  please sign these orders via discharge navigator,  Select New Orders & click on the button choice - Manage This Unsigned Work.     Thank you for allowing pharmacy to be a part of this patient's care.  Lu Duffel, PharmD, BCPS Clinical Pharmacist 07/08/2019 3:08 PM

## 2019-07-08 NOTE — Progress Notes (Addendum)
Date of Admission:  07/06/2019      Subjective: Doing much better Ambulating No fever   Medications:  . apixaban  5 mg Oral BID  . atorvastatin  40 mg Oral Daily  . budesonide  1 puff Inhalation Daily  . fluticasone  2 spray Each Nare Daily  . isosorbide mononitrate  30 mg Oral Daily    Objective: Vital signs in last 24 hours: Temp:  [98.1 F (36.7 C)-98.8 F (37.1 C)] 98.7 F (37.1 C) (05/14 0832) Pulse Rate:  [55-63] 63 (05/14 0832) Resp:  [15-30] 17 (05/14 0832) BP: (120-156)/(67-84) 155/78 (05/14 0832) SpO2:  [94 %-96 %] 96 % (05/14 0832)  PHYSICAL EXAM:  General: Alert, cooperative, no distress,  Head: Normocephalic, without obvious abnormality, atraumatic. Eyes: Conjunctivae clear, anicteric sclerae. Pupils are equal ENT Nares normal. No drainage or sinus tenderness. Lips, mucosa, and tongue normal. No Thrush Neck: Supple, symmetrical, no adenopathy, thyroid: non tender no carotid bruit and no JVD. Back: No CVA tenderness. Lungs: Clear to auscultation bilaterally. No Wheezing or Rhonchi. No rales. Heart: Regular rate and rhythm, no murmur, rub or gallop. Abdomen: Soft, non-tender,not distended. Bowel sounds normal. No masses Extremities: atraumatic, no cyanosis. No edema. No clubbing Skin: changes related to aging, sun Lymph: Cervical, supraclavicular normal. Neurologic: Grossly non-focal  Lab Results Recent Labs    07/06/19 2342 07/08/19 0433  WBC 11.0* 7.8  HGB 14.9 15.0  HCT 44.8 44.9  NA 140 143  K 3.3* 4.0  CL 109 113*  CO2 25 23  BUN 24* 20  CREATININE 1.56* 1.25*   Liver Panel Recent Labs    07/06/19 2342 07/08/19 0433  PROT 5.7* 5.9*  ALBUMIN 3.5 3.4*  AST 22 34  ALT 20 33  ALKPHOS 66 71  BILITOT 1.1 1.4*  BILIDIR  --  0.3*  IBILI  --  1.1*   Microbiology: 5/11 [facility] pseudomonas 1/4 5/12 BC NG Studies/Results: US Abdomen Complete  Result Date: 07/08/2019 CLINICAL DATA:  Elevated LFTs. EXAM: ABDOMEN ULTRASOUND COMPLETE  COMPARISON:  None. FINDINGS: Gallbladder: Multiple mobile gallstones. Borderline wall thickening visualized. No sonographic Murphy sign noted by sonographer. Common bile duct: Diameter: 3 mm, normal. Liver: No focal lesion identified. Within normal limits in parenchymal echogenicity. Portal vein is patent on color Doppler imaging with normal direction of blood flow towards the liver. IVC: No abnormality visualized. Pancreas: Visualized portion unremarkable. Spleen: Size and appearance within normal limits. Right Kidney: Length: 11.2 cm. Echogenicity within normal limits. 1.6 cm complex cyst. No hydronephrosis visualized. Left Kidney: Length: 11.2 cm. Echogenicity within normal limits. Two simple cysts measuring up to 5.5 cm. No mass or hydronephrosis visualized. Abdominal aorta: Infrarenal abdominal aortic aneurysm measuring up to 3.4 cm. Other findings: None. IMPRESSION: 1. Cholelithiasis with borderline wall thickening, equivocal for early acute cholecystitis. 2. 1.6 cm complex right renal cyst. Follow-up ultrasound in 6 months is recommended to evaluate for interval change. 3. 3.4 cm infrarenal abdominal aortic aneurysm. Recommend followup by ultrasound in 3 years. This recommendation follows ACR consensus guidelines: White Paper of the ACR Incidental Findings Committee II on Vascular Findings. J Am Coll Radiol 2013; 10:789-794. Aortic aneurysm NOS (ICD10-I71.9) Electronically Signed   By: Titus Dubin M.D.   On: 07/08/2019 10:02     Assessment/Plan: Pseudomonas bacteremia.  Unclear source.  Sudden onset.  1/4 positive . Repeat culture before starting antibiotic is negative as well So looks like transient bacteremia Susceptibility pending Because patient has aortic aneurysm of 3.5 cm will avoid quinolone.  Will give cefepime  He will get TEE as OP as he has pacemaker If TEE okay- he will need total of 2 weeks of IV cefepime IF TEE abnormal the duration will be prolonged and further management will  be decided CT abdomen/pelvis showed a  AAA of 3.5 cm Gall stones Colonic diverticulosis enlarged prostate None of the above likely contributing to the pseudomonas  CAD status post CABG  Pacemaker for bradycardia  Discussed the management in detail with patient , daughter and hospitalist and pharmacist and cardiologist Will follow him as OP

## 2019-07-08 NOTE — Treatment Plan (Signed)
Diagnosis: Pseudomonas bacteremia Baseline Creatinine 1.25    Allergies  Allergen Reactions  . Procardia [Nifedipine] Other (See Comments)    Other reaction(s): Unknown dizzy Gets very woozy with this medication    OPAT Orders Cefepime 2 grams IV every 12 hours until 07/21/19- may need more if TEE shows endocarditis   [facility] Care Per Protocol:  Labs weekly Monday while on IV antibiotics: __X CBC with differential __ BMP __X CMP  _X_ Please leave PIC in place until doctor has seen patient or been notified  Fax weekly labs to [phone]  Clinic Follow Up Appt:10 days  Call [phone] to make appt

## 2019-07-08 NOTE — Progress Notes (Signed)
*  PRELIMINARY RESULTS* Echocardiogram 2D Echocardiogram has been performed.  Wallie Char Hale Chalfin 07/08/2019, 11:21 AM

## 2019-07-08 NOTE — Progress Notes (Signed)
PROGRESS NOTE    Joshua Frye  [id number] DOB: [birth year] DOA: 07/06/2019 PCP: [name], MD      Brief Narrative:  [name] is a 84 y.o. M with hx CAD s/p CABG in 1987, pacer, HTN, COPD not on O2, Afib on Eliquis, OSA on CPAP who presented with fever, chills and abnormal blood culture.  Patient initially developed symptoms day prior to admission, fever, chills.  Seen in ER, noted to have lactate 2.9, bilirubin 1.5, CXR clear, but otherwise appeared benign, patient preferred to be discharged home.  1/2 Blood cultures returned positive for Pseudomonas by BCID, so patient was called back.    In the ER on return visit, afebrile, HR normal, respirations and BP normal, pulse ox normal.  Started on Zosyn and admitted.        Assessment & Plan:  Pseudomonas bacteremia Patient found to have 1/4 blood cultures with BCID detection of pseudomonal bacteremia.    No clear source.  Chest x-ray yesterday normal.  Minimal indirect bilirubin elevation insignificant. Has had no recent procedures or illnesses. No urinary symptoms, no GI symptoms.   Only possible RF for pseudomonas bactermia is pacer.  CT chest, abdomen and pelvis with contrast is normal. -Continue Zosyn -Consult ID  -Repeat cultures pending -Follow growth and speciation of original culture -Follow up echo   -Cardiology have been consulted for TEE   Hyperbilirubinemia Minimal elevation in Tbili, asymptomatic.  US abdomen showed cholelithiasis, but CT abdomen normal and without clinical signs (RUQ pain, nausea/vomiting) to suggest cholecystitis, this is highly doubted. -Trend LFTs  Coronary disease, secondary prevention Hypertension BP normal -Continue atorvastatin -Continue Imdur -Hold amlodipine  Atrial fibrillation Rate normal -Continue Eliquis  Sleep apnea -CPAP at night  COPD No active disease -Hold Pulmicort  CKD IIIa Baseline Cr 1.3. Stable relative to baseline  BPH -Continue  Flomax  AAA Incidental finding -Follow up US in 3 years  Complex right renal cyst Asymptomatic, incidental finding Follow up ultrasound in 6 months for interval change         Disposition: Status is: Inpatient  Remains inpatient appropriate because:he has bacteremia with pseudomonas, and the hospital is the appropriate level of care for this severity of illness   Dispo: The patient is from: Home              Anticipated d/c is to: Home              Anticipated d/c date is: TBD              Patient currently is not medically stable to d/c.    Will obtain echo today.  Possibly PICC and home tomorrow and TEE as outpatient with ID follow up.          MDM: The below labs and imaging reports reviewed and summarized above.  Medication management as above.    DVT prophylaxis: N/A on Eliquis Code Status: FULL Family Communication:     Consultants:   ID  Cardiology  Procedures:     Antimicrobials:   Zosyn 5/11 >>   Culture data:   5/11 blood culture 1/2 with pseudomonas by BCID  5/12 blood culture x2 -- NGTD           Subjective: Patient feels fine.  No fever.  No chest discomfort, cough, sputum.  No abdominal pain, right upper quadrant pain, nausea, vomiting.  No dysuria, urinary frequency.          Objective: Vitals:  07/07/19 1603 07/07/19 2041 07/07/19 2332 07/08/19 0832  BP: (!) 151/72 (!) 147/73 127/74 (!) 155/78  Pulse: (!) 58 60 (!) 59 63  Resp: 17  18 17   Temp: 98.1 F (36.7 C)  98.8 F (37.1 C) 98.7 F (37.1 C)  TempSrc: Oral  Oral Oral  SpO2:   96% 96%    Intake/Output Summary (Last 24 hours) at 07/08/2019 1314 Last data filed at 07/08/2019 0701 Gross per 24 hour  Intake 2070.36 ml  Output 1375 ml  Net 695.36 ml   There were no vitals filed for this visit.  Examination: General appearance: Elderly adult male, lying in bed, no acute distress, interactive     HEENT: Anicteric, conjunctival pink, lids and lashes  normal.  No nasal deformity, discharge, or epistaxis.  Lips normal, oropharynx dry, dentition in good repair, no oral lesions, hearing normal Skin: No suspicious rashes or lesions Cardiac: Regular rate and rhythm, JVP normal, no lower extremity edema Respiratory: Respiratory rate normal, lungs clear without rales or wheezes Abdomen: No tenderness palpation or guarding, no hepatosplenomegaly, no ascites.  Negative Murphy sign. MSK: Also bulk and tone for age. Neuro: Extraocular movements intact, moves all extremities with normal strength and coordination, speech fluent. Psych: Attention normal, affect normal, judgment insight appear normal.      Data Reviewed: I have personally reviewed following labs and imaging studies:  CBC: Recent Labs  Lab 07/05/19 1452 07/06/19 2342 07/08/19 0433  WBC 6.6 11.0* 7.8  HGB 16.4 14.9 15.0  HCT 47.7 44.8 44.9  MCV 88.0 91.6 90.0  PLT 182 138* [id number]*   Basic Metabolic Panel: Recent Labs  Lab 07/05/19 1452 07/06/19 2342 07/08/19 0433  NA 142 140 143  K 4.2 3.3* 4.0  CL 109 109 113*  CO2 23 25 23   GLUCOSE 111* 111* 103*  BUN 21 24* 20  CREATININE 1.33* 1.56* 1.25*  CALCIUM 9.3 8.3* 8.6*   GFR: Estimated Creatinine Clearance: 46.3 mL/min (A) (by C-G formula based on SCr of 1.25 mg/dL (H)). Liver Function Tests: Recent Labs  Lab 07/05/19 1452 07/06/19 2342 07/08/19 0433  AST 22 22 34  ALT 19 20 33  ALKPHOS 90 66 86  BILITOT 1.5* 1.1 1.4*  PROT 6.8 5.7* 5.9*  ALBUMIN 4.4 3.5 3.4*   No results for input(s): LIPASE, AMYLASE in the last 168 hours. No results for input(s): AMMONIA in the last 168 hours. Coagulation Profile: Recent Labs  Lab 07/06/19 2357  INR 2.2*   Cardiac Enzymes: No results for input(s): CKTOTAL, CKMB, CKMBINDEX, TROPONINI in the last 168 hours. BNP (last 3 results) No results for input(s): PROBNP in the last 8760 hours. HbA1C: No results for input(s): HGBA1C in the last 72 hours. CBG: No results for  input(s): GLUCAP in the last 168 hours. Lipid Profile: No results for input(s): CHOL, HDL, LDLCALC, TRIG, CHOLHDL, LDLDIRECT in the last 72 hours. Thyroid Function Tests: No results for input(s): TSH, T4TOTAL, FREET4, T3FREE, THYROIDAB in the last 72 hours. Anemia Panel: No results for input(s): VITAMINB12, FOLATE, FERRITIN, TIBC, IRON, RETICCTPCT in the last 72 hours. Urine analysis:    Component Value Date/Time   COLORURINE YELLOW (A) 07/05/2019 1456   APPEARANCEUR HAZY (A) 07/05/2019 1456   APPEARANCEUR Clear 06/11/2017 1428   LABSPEC 1.015 07/05/2019 1456   PHURINE 5.0 07/05/2019 1456   GLUCOSEU NEGATIVE 07/05/2019 1456   HGBUR NEGATIVE 07/05/2019 [place] 07/05/2019 1456   BILIRUBINUR Negative 06/11/2017 [place] 07/05/2019 1456  PROTEINUR NEGATIVE 07/05/2019 1456   NITRITE NEGATIVE 07/05/2019 1456   [place] 07/05/2019 1456   Sepsis Labs: @LABRCNTIP (procalcitonin:4,lacticacidven:4)  ) Recent Results (from the past 240 hour(s))  Blood culture (single)     Status: Abnormal (Preliminary result)   Collection Time: 07/05/19  2:52 PM   Specimen: BLOOD  Result Value Ref Range Status   Specimen Description   Final    BLOOD RAC Performed at [facility], [address], [place], [place] [address]    Special Requests   Final    BOTTLES DRAWN AEROBIC AND ANAEROBIC Blood Culture results may not be optimal due to an excessive volume of blood received in culture bottles Performed at [facility], [address] [place], [place] [address]    Culture  Setup Time   Final    GRAM NEGATIVE RODS AEROBIC BOTTLE ONLY CRITICAL RESULT CALLED TO, READ BACK BY AND VERIFIED WITH: JASON ROBBINS ON 07/06/19 AT 2054 AKT Performed at [facility], [place] [address] [place], [place] [address]    Culture PSEUDOMONAS AERUGINOSA (A)  Final   Report Status PENDING  Incomplete  Blood Culture ID Panel (Reflexed)     Status:  Abnormal   Collection Time: 07/05/19  2:52 PM  Result Value Ref Range Status   Enterococcus species NOT DETECTED NOT DETECTED Final   Listeria monocytogenes NOT DETECTED NOT DETECTED Final   Staphylococcus species NOT DETECTED NOT DETECTED Final   Staphylococcus aureus (BCID) NOT DETECTED NOT DETECTED Final   Streptococcus species NOT DETECTED NOT DETECTED Final   Streptococcus agalactiae NOT DETECTED NOT DETECTED Final   Streptococcus pneumoniae NOT DETECTED NOT DETECTED Final   Streptococcus pyogenes NOT DETECTED NOT DETECTED Final   Acinetobacter baumannii NOT DETECTED NOT DETECTED Final   Enterobacteriaceae species NOT DETECTED NOT DETECTED Final   Enterobacter cloacae complex NOT DETECTED NOT DETECTED Final   Escherichia coli NOT DETECTED NOT DETECTED Final   Klebsiella oxytoca NOT DETECTED NOT DETECTED Final   Klebsiella pneumoniae NOT DETECTED NOT DETECTED Final   Proteus species NOT DETECTED NOT DETECTED Final   Serratia marcescens NOT DETECTED NOT DETECTED Final   Carbapenem resistance NOT DETECTED NOT DETECTED Final   Haemophilus influenzae NOT DETECTED NOT DETECTED Final   Neisseria meningitidis NOT DETECTED NOT DETECTED Final   Pseudomonas aeruginosa DETECTED (A) NOT DETECTED Final    Comment: CRITICAL RESULT CALLED TO, READ BACK BY AND VERIFIED WITH: JASON ROBBINS ON 07/06/19 AT 2054 AKT    Candida albicans NOT DETECTED NOT DETECTED Final   Candida glabrata NOT DETECTED NOT DETECTED Final   Candida krusei NOT DETECTED NOT DETECTED Final   Candida parapsilosis NOT DETECTED NOT DETECTED Final   Candida tropicalis NOT DETECTED NOT DETECTED Final    Comment: Performed at [facility], [address] [place], [place] [address]  Blood culture (single)     Status: None (Preliminary result)   Collection Time: 07/05/19  5:20 PM   Specimen: BLOOD  Result Value Ref Range Status   Specimen Description BLOOD BLOOD RIGHT WRIST  Final   Special Requests   Final     BOTTLES DRAWN AEROBIC AND ANAEROBIC Blood Culture results may not be optimal due to an excessive volume of blood received in culture bottles   Culture   Final    NO GROWTH 3 DAYS Performed at [facility], [address] [place], [place] [address]    Report Status PENDING  Incomplete  SARS Coronavirus 2 by RT  PCR (hospital order, performed in [facility] hospital lab) Nasopharyngeal Nasopharyngeal Swab     Status: None   Collection Time: 07/05/19  6:31 PM   Specimen: Nasopharyngeal Swab  Result Value Ref Range Status   SARS Coronavirus 2 NEGATIVE NEGATIVE Final    Comment: (NOTE) SARS-CoV-2 target nucleic acids are NOT DETECTED. The SARS-CoV-2 RNA is generally detectable in upper and lower respiratory specimens during the acute phase of infection. The lowest concentration of SARS-CoV-2 viral copies this assay can detect is 250 copies / mL. A negative result does not preclude SARS-CoV-2 infection and should not be used as the sole basis for treatment or other patient management decisions.  A negative result may occur with improper specimen collection / handling, submission of specimen other than nasopharyngeal swab, presence of viral mutation(s) within the areas targeted by this assay, and inadequate number of viral copies (<250 copies / mL). A negative result must be combined with clinical observations, patient history, and epidemiological information. Fact Sheet for Patients:   StrictlyIdeas.no Fact Sheet for Healthcare Providers: BankingDealers.co.za This test is not yet approved or cleared  by the [place] FDA and has been authorized for detection and/or diagnosis of SARS-CoV-2 by FDA under an Emergency Use Authorization (EUA).  This EUA will remain in effect (meaning this test can be used) for the duration of the COVID-19 declaration under Section 564(b)(1) of the Act, 21 U.S.C. section 360bbb-3(b)(1), unless the  authorization is terminated or revoked sooner. Performed at [facility], [address] [place], [place] [address]   Blood culture (routine x 2)     Status: None (Preliminary result)   Collection Time: 07/06/19 11:41 PM   Specimen: Right Antecubital; Blood  Result Value Ref Range Status   Specimen Description RIGHT ANTECUBITAL  Final   Special Requests   Final    BOTTLES DRAWN AEROBIC AND ANAEROBIC Blood Culture adequate volume   Culture   Final    NO GROWTH 1 DAY Performed at [facility], [address] [place], [place] [address]    Report Status PENDING  Incomplete  Blood culture (routine x 2)     Status: None (Preliminary result)   Collection Time: 07/06/19 11:46 PM   Specimen: BLOOD LEFT FOREARM  Result Value Ref Range Status   Specimen Description BLOOD LEFT FOREARM  Final   Special Requests   Final    BOTTLES DRAWN AEROBIC AND ANAEROBIC Blood Culture adequate volume   Culture   Final    NO GROWTH 1 DAY Performed at [facility], [address], [place], [place] [address]    Report Status PENDING  Incomplete         Radiology Studies: CT CHEST W CONTRAST  Result Date: 07/08/2019 CLINICAL DATA:  Infection of unknown source, respiratory illness, question intra-abdominal affect ses, history coronary artery disease post MI, asthma, COPD, bronchiectasis, hypertension, atrial fibrillation, multiple skin cancers EXAM: CT CHEST, ABDOMEN, AND PELVIS WITH CONTRAST TECHNIQUE: Multidetector CT imaging of the chest, abdomen and pelvis was performed following the standard protocol during bolus administration of intravenous contrast. Sagittal and coronal MPR images reconstructed from axial data set. CONTRAST:  1105mL OMNIPAQUE IOHEXOL 300 MG/ML SOLN IV. Dilute oral contrast. COMPARISON:  CT angio chest 04/19/2015 FINDINGS: CT CHEST FINDINGS Cardiovascular: Enlargement of cardiac chambers. Pacemaker leads RIGHT atrium and RIGHT ventricle. Postsurgical  changes of CABG. Atherosclerotic calcifications aorta, proximal great vessels, and coronary arteries. No pericardial effusion. Pulmonary arteries unremarkable for non targeted exam. Mediastinum/Nodes: Base of  cervical region normal appearance. No thoracic adenopathy. Esophagus unremarkable. Lungs/Pleura: Subpleural atelectasis versus scarring RIGHT middle lobe. Tiny calcified granuloma RIGHT upper lobe. Musculoskeletal: Osseous demineralization. Nonunion of sternotomy. CT ABDOMEN PELVIS FINDINGS Hepatobiliary: Cholelithiasis.  Liver unremarkable. Pancreas: Normal appearance Spleen: Normal appearance Adrenals/Urinary Tract: Adrenal glands normal appearance. Large cyst upper LEFT kidney posteriorly 5.9 x 5.6 cm image 70. Additional smaller renal cysts bilaterally. No solid renal mass, hydronephrosis or urinary tract calcification. Ureters and bladder unremarkable. Stomach/Bowel: Extensive distal colonic diverticulosis without evidence of diverticulitis. Few RIGHT-sided colonic diverticula identified as well, uncomplicated. Stomach and small bowel loops normal appearance. Appendix not visualized, surgically absent by history Vascular/Lymphatic: Atherosclerotic calcifications aorta and iliac arteries. Aneurysmal dilatation mid to distal abdominal aorta up to 3.4 x 3.4 cm. No adenopathy. Reproductive: Prostatic enlargement gland measuring 5.6 x 4.8 x 4.5 cm (volume = 63 cm^3). Other: No free air or free fluid. Tiny umbilical hernia containing fat. No intra-abdominal or intrapelvic inflammatory process identified. Prior LEFT inguinal herniorrhaphy. Musculoskeletal: Degenerative disc disease changes L2-L3. Bones demineralized. IMPRESSION: Extensive distal colonic diverticulosis without evidence of diverticulitis. Cholelithiasis. BILATERAL renal cysts. Prostatic enlargement. Aneurysmal dilatation mid to distal abdominal aorta up to 3.4 x 3.4 cm. Recommend followup by [place] in 3 years. This recommendation follows ACR consensus  guidelines: White Paper of the ACR Incidental Findings Committee II on Vascular Findings. J Am Coll Radiol 2013; 123XX123 Tiny umbilical hernia containing fat. Osseous nonunion of sternotomy. No acute intrathoracic, intra-abdominal or intrapelvic inflammatory process identified. Aortic Atherosclerosis (ICD10-I70.0). Electronically Signed   By: Lavonia Dana M.D.   On: 07/08/2019 12:03   US Abdomen Complete  Result Date: 07/08/2019 CLINICAL DATA:  Elevated LFTs. EXAM: ABDOMEN ULTRASOUND COMPLETE COMPARISON:  None. FINDINGS: Gallbladder: Multiple mobile gallstones. Borderline wall thickening visualized. No sonographic Murphy sign noted by sonographer. Common bile duct: Diameter: 3 mm, normal. Liver: No focal lesion identified. Within normal limits in parenchymal echogenicity. Portal vein is patent on color Doppler imaging with normal direction of blood flow towards the liver. IVC: No abnormality visualized. Pancreas: Visualized portion unremarkable. Spleen: Size and appearance within normal limits. Right Kidney: Length: 11.2 cm. Echogenicity within normal limits. 1.6 cm complex cyst. No hydronephrosis visualized. Left Kidney: Length: 11.2 cm. Echogenicity within normal limits. Two simple cysts measuring up to 5.5 cm. No mass or hydronephrosis visualized. Abdominal aorta: Infrarenal abdominal aortic aneurysm measuring up to 3.4 cm. Other findings: None. IMPRESSION: 1. Cholelithiasis with borderline wall thickening, equivocal for early acute cholecystitis. 2. 1.6 cm complex right renal cyst. Follow-up ultrasound in 6 months is recommended to evaluate for interval change. 3. 3.4 cm infrarenal abdominal aortic aneurysm. Recommend followup by ultrasound in 3 years. This recommendation follows ACR consensus guidelines: White Paper of the ACR Incidental Findings Committee II on Vascular Findings. J Am Coll Radiol 2013; 10:789-794. Aortic aneurysm NOS (ICD10-I71.9) Electronically Signed   By: Titus Dubin M.D.   On:  07/08/2019 10:02   CT ABDOMEN PELVIS W CONTRAST  Result Date: 07/08/2019 CLINICAL DATA:  Infection of unknown source, respiratory illness, question intra-abdominal affect ses, history coronary artery disease post MI, asthma, COPD, bronchiectasis, hypertension, atrial fibrillation, multiple skin cancers EXAM: CT CHEST, ABDOMEN, AND PELVIS WITH CONTRAST TECHNIQUE: Multidetector CT imaging of the chest, abdomen and pelvis was performed following the standard protocol during bolus administration of intravenous contrast. Sagittal and coronal MPR images reconstructed from axial data set. CONTRAST:  124mL OMNIPAQUE IOHEXOL 300 MG/ML SOLN IV. Dilute oral contrast. COMPARISON:  CT angio chest 04/19/2015 FINDINGS: CT  CHEST FINDINGS Cardiovascular: Enlargement of cardiac chambers. Pacemaker leads RIGHT atrium and RIGHT ventricle. Postsurgical changes of CABG. Atherosclerotic calcifications aorta, proximal great vessels, and coronary arteries. No pericardial effusion. Pulmonary arteries unremarkable for non targeted exam. Mediastinum/Nodes: Base of cervical region normal appearance. No thoracic adenopathy. Esophagus unremarkable. Lungs/Pleura: Subpleural atelectasis versus scarring RIGHT middle lobe. Tiny calcified granuloma RIGHT upper lobe. Musculoskeletal: Osseous demineralization. Nonunion of sternotomy. CT ABDOMEN PELVIS FINDINGS Hepatobiliary: Cholelithiasis.  Liver unremarkable. Pancreas: Normal appearance Spleen: Normal appearance Adrenals/Urinary Tract: Adrenal glands normal appearance. Large cyst upper LEFT kidney posteriorly 5.9 x 5.6 cm image 70. Additional smaller renal cysts bilaterally. No solid renal mass, hydronephrosis or urinary tract calcification. Ureters and bladder unremarkable. Stomach/Bowel: Extensive distal colonic diverticulosis without evidence of diverticulitis. Few RIGHT-sided colonic diverticula identified as well, uncomplicated. Stomach and small bowel loops normal appearance. Appendix not  visualized, surgically absent by history Vascular/Lymphatic: Atherosclerotic calcifications aorta and iliac arteries. Aneurysmal dilatation mid to distal abdominal aorta up to 3.4 x 3.4 cm. No adenopathy. Reproductive: Prostatic enlargement gland measuring 5.6 x 4.8 x 4.5 cm (volume = 63 cm^3). Other: No free air or free fluid. Tiny umbilical hernia containing fat. No intra-abdominal or intrapelvic inflammatory process identified. Prior LEFT inguinal herniorrhaphy. Musculoskeletal: Degenerative disc disease changes L2-L3. Bones demineralized. IMPRESSION: Extensive distal colonic diverticulosis without evidence of diverticulitis. Cholelithiasis. BILATERAL renal cysts. Prostatic enlargement. Aneurysmal dilatation mid to distal abdominal aorta up to 3.4 x 3.4 cm. Recommend followup by [place] in 3 years. This recommendation follows ACR consensus guidelines: White Paper of the ACR Incidental Findings Committee II on Vascular Findings. J Am Coll Radiol 2013; 123XX123 Tiny umbilical hernia containing fat. Osseous nonunion of sternotomy. No acute intrathoracic, intra-abdominal or intrapelvic inflammatory process identified. Aortic Atherosclerosis (ICD10-I70.0). Electronically Signed   By: Lavonia Dana M.D.   On: 07/08/2019 12:03        Scheduled Meds: . apixaban  5 mg Oral BID  . atorvastatin  40 mg Oral Daily  . budesonide  1 puff Inhalation Daily  . fluticasone  2 spray Each Nare Daily  . isosorbide mononitrate  30 mg Oral Daily   Continuous Infusions: . piperacillin-tazobactam (ZOSYN)  IV 3.375 g (07/08/19 1025)     LOS: 1 day    Time spent: 25 minutes    Edwin Dada, MD [facility] 07/08/2019, 1:14 PM     Please page though [place] or Epic secure chat:  For password, contact charge nurse

## 2019-07-08 NOTE — Progress Notes (Signed)
Pharmacy Antibiotic Note  Joshua Frye is a 84 y.o. male admitted on 07/06/2019 with bacteremia.  Pharmacy has been consulted for Zosyn dosing.  Plan: Continue Zosyn 3.375g IV q8h (4 hour infusion).     Temp (24hrs), Avg:98.5 F (36.9 C), Min:98.1 F (36.7 C), Max:98.8 F (37.1 C)  Recent Labs  Lab 07/05/19 1452 07/05/19 1720 07/05/19 1859 07/06/19 2342 07/06/19 2357 07/08/19 0433  WBC 6.6  --   --  11.0*  --  7.8  CREATININE 1.33*  --   --  1.56*  --  1.25*  LATICACIDVEN 2.9* 2.2* 1.9  --  1.1  --     Estimated Creatinine Clearance: 46.3 mL/min (A) (by C-G formula based on SCr of 1.25 mg/dL (H)).    Allergies  Allergen Reactions  . Procardia [Nifedipine] Other (See Comments)    Other reaction(s): Unknown dizzy Gets very woozy with this medication    Antimicrobials this admission: Zosyn 5/13 >>  Dose adjustments this admission:   Microbiology results:  BCx: 5/12 - pending, pta culture positive for pseudomonas    Thank you for allowing pharmacy to be a part of this patient's care.  Lu Duffel, PharmD, BCPS Clinical Pharmacist 07/08/2019 8:54 AM

## 2019-07-08 NOTE — Progress Notes (Signed)
Peripherally Inserted Central Catheter Placement  The IV Nurse has discussed with the patient and/or persons authorized to consent for the patient, the purpose of this procedure and the potential benefits and risks involved with this procedure.  The benefits include less needle sticks, lab draws from the catheter, and the patient may be discharged home with the catheter. Risks include, but not limited to, infection, bleeding, blood clot (thrombus formation), and puncture of an artery; nerve damage and irregular heartbeat and possibility to perform a PICC exchange if needed/ordered by physician.  Alternatives to this procedure were also discussed.  Bard Power PICC patient education guide, fact sheet on infection prevention and patient information card has been provided to patient /or left at bedside.    PICC Placement Documentation  PICC Single Lumen [id number] PICC Right Basilic 39 cm 0 cm (Active)  Indication for Insertion or Continuance of Line Home intravenous therapies (PICC only) 07/08/19 1800  Exposed Catheter (cm) 0 cm 07/08/19 1800  Site Assessment Clean;Dry;Intact 07/08/19 1800  Line Status Flushed;Saline locked;Blood return noted 07/08/19 1800  Dressing Type Transparent;Securing device 07/08/19 1800  Dressing Status Clean;Dry;Intact;Antimicrobial disc in place 07/08/19 1800  Dressing Change Due 07/15/19 07/08/19 1800       Darlyn Read 07/08/2019, 6:39 PM

## 2019-07-09 LAB — CBC
HCT: 43.6 % (ref 39.0–52.0)
Hemoglobin: 15.1 g/dL (ref 13.0–17.0)
MCH: 30.3 pg (ref 26.0–34.0)
MCHC: 34.6 g/dL (ref 30.0–36.0)
MCV: 87.4 fL (ref 80.0–100.0)
Platelets: 149 10*3/uL — ABNORMAL LOW (ref 150–400)
RBC: 4.99 MIL/uL (ref 4.22–5.81)
RDW: 14.5 % (ref 11.5–15.5)
WBC: 7.6 10*3/uL (ref 4.0–10.5)
nRBC: 0 % (ref 0.0–0.2)

## 2019-07-09 LAB — BASIC METABOLIC PANEL
Anion gap: 7 (ref 5–15)
BUN: 16 mg/dL (ref 8–23)
CO2: 24 mmol/L (ref 22–32)
Calcium: 8.7 mg/dL — ABNORMAL LOW (ref 8.9–10.3)
Chloride: 110 mmol/L (ref 98–111)
Creatinine, Ser: 1.15 mg/dL (ref 0.61–1.24)
GFR calc Af Amer: >60 mL/min (ref >60)
GFR calc non Af Amer: 59 mL/min — ABNORMAL LOW (ref >60)
Glucose, Bld: 90 mg/dL (ref 70–99)
Potassium: 4 mmol/L (ref 3.5–5.1)
Sodium: 141 mmol/L (ref 135–145)

## 2019-07-09 LAB — CULTURE, BLOOD (SINGLE)

## 2019-07-09 LAB — PROCALCITONIN: Procalcitonin: 2.49 ng/mL

## 2019-07-09 MED ORDER — CEFEPIME IV (FOR PTA / DISCHARGE USE ONLY)
2.0000 g | Freq: Two times a day (BID) | INTRAVENOUS | 0 refills | Status: AC
Start: 2019-07-09 — End: 2019-07-22

## 2019-07-09 NOTE — Progress Notes (Signed)
Pt d/c to home with wife. Daughter educated on medication per pt request. PICC line still in place and clamped. IVs removed intact. VSS. Education completed. All questions answered.

## 2019-07-09 NOTE — TOC Transition Note (Signed)
Transition of Care [facility]) - CM/SW Discharge Note   Patient Details  Name: Joshua Frye MRN: [id number] Date of Birth: 09-29-1935  Transition of Care [facility]) CM/SW Contact:  Boris Sharper, LCSW Phone Number: 07/09/2019, 11:56 AM   Clinical Narrative:    Pt medially stable per MD. Pt will be transported home by his wife, CSW contacted Carolynn Sayers with [facility] to notify of discharge. CSW also contacted Jenny Reichmann with [facility] to notify of discharge.    Final next level of care: Home w Home Health Services Barriers to Discharge: No Barriers Identified   Patient Goals and CMS Choice        Discharge Placement                Patient to be transferred to facility by: Spouse Name of family member notified: ,[place] Patient and family notified of of transfer: 07/09/19  Discharge Plan and Services                DME Arranged: IV pump/equipment DME Agency: ([facility]) Date DME Agency Contacted: 07/09/19 Time DME Agency Contacted: [id number] Representative spoke with at DME Agency: Carolynn Sayers HH Arranged: RN [place] Agency: [place] Date [place]: 07/09/19 Time [place]: [id number] Representative spoke with at [place]: [place] Determinants of Health ([place]) Interventions     Readmission Risk Interventions No flowsheet data found.

## 2019-07-09 NOTE — Discharge Summary (Signed)
Physician Discharge Summary  Joshua Frye AJG:[id number] DOB: Aug 05, 1935 DOA: 07/06/2019  PCP: Joshua Aus, MD  Admit date: 07/06/2019 Discharge date: 07/09/2019  Admitted From: Home  Disposition:  Home with [facility]   Recommendations for Outpatient Follow-up:  1. Follow-up with cardiology for TEE 2. Follow up with infectious disease, Dr. Steva Frye 3. Follow-up with PCP in 1-2 weeks 4. Please obtain weekly CBC with differential and BMP as well as every other week ESR/CRP and fax to Dr. Steva Frye  5. Dr. Sabra Frye: Please obtain follow-up ultrasound kidney in 6 months to monitor right renal cyst 6. Dr. Sabra Frye: Please obtain repeat ultrasound abdominal aneurysm in 3 years     Home Health: [facility] for IV therapy Equipment/Devices: IV infusion pump  Discharge Condition: Good CODE STATUS: Full code Diet recommendation: Regular  Brief/Interim Summary: Joshua Frye is a 84 y.o. M with hx CAD s/p CABG in 1987, pacer, HTN, COPD not on O2, Afib on Eliquis, OSA on CPAP who presented with fever, chills and abnormal blood culture.  Patient initially developed symptoms day prior to admission, fever, chills.  Seen in ER, noted to have lactate 2.9, bilirubin 1.5, CXR clear, but otherwise appeared benign, patient preferred to be discharged home.  1/2 Blood cultures returned positive for Pseudomonas by [facility], so patient was called back.    In the ER on return visit, afebrile, HR normal, respirations and BP normal, pulse ox normal.  Started on Zosyn and admitted.     PRINCIPAL HOSPITAL DIAGNOSIS: Pseudomonal bacteremia    Discharge Diagnoses:  Pseudomonas bacteremia Patient admitted and started on Zosyn.  Only 1 of 4 blood cultures with growth.  Pseudomonas aeruginosa sensitive to cefepime.     No clear source.    CT chest abdomen pelvis unremarkable except for incidental findings noted below.  Only possible RF for pseudomonas bactermia is pacer.    Transthoracic echocardiogram  negative for vegetation on pacer.  Infectious disease have arranged for home infusion therapy with cefepime.  They will also arrange for interval TEE.  If negative will complete cefepime after 14 days on May 27 otherwise will extend duration of treatment longer.      Coronary disease, secondary prevention Hypertension   Atrial fibrillation  Sleep apnea  COPD  CKD IIIa Baseline Cr 1.3. Stable relative to baseline  BPH  AAA This was an incidental finding.   -Needs follow up AAA ultrasound in 3 years  Complex right renal cyst Asymptomatic, incidental finding -Needs follow up ultrasound in 6 months for interval change          Discharge Instructions  Discharge Instructions    [facility] pharmacist to adjust dose for Vancomycin, Aminoglycosides and other anti-infective therapies as requested by physician.   Complete by: As directed    [facility] to provide Cath Flo 4m   Complete by: As directed    Administer for PICC line occlusion and as ordered by physician for other access device issues.   Anaphylaxis Kit: Provided to treat any anaphylactic reaction to the medication being provided to the patient if First Dose or when requested by physician   Complete by: As directed    Epinephrine 162mml vial / amp: Administer 0.6m32m0.6ml54mubcutaneously once for moderate to severe anaphylaxis, nurse to call physician and pharmacy when reaction occurs and call 911 if needed for immediate care   Diphenhydramine 50mg26mIV vial: Administer 25-50mg 76mM PRN for first dose reaction, rash, itching, mild reaction, nurse to call physician  and pharmacy when reaction occurs   Sodium Chloride 0.9% NS 546m IV: Administer if needed for hypovolemic blood pressure drop or as ordered by physician after call to physician with anaphylactic reaction   Change dressing on IV access line weekly and PRN   Complete by: As directed    Diet - low sodium heart healthy    Complete by: As directed    Discharge instructions   Complete by: As directed    From Joshua Frye You were admitted for pseudomonas bacteremia THis requires treatment with 14 days of IV antibiotics.  Take Cefepime twice daily infusion until May 27th at least  Call Dr. RGwenevere Ghazioffice on Monday (number below) to arrange a follow up time  Call Dr. FBethanne Gingeroffice on Tuesday if you haven't heard from them to arrange your echo procedure   Flush IV access with Sodium Chloride 0.9% and Heparin 10 units/ml or 100 units/ml   Complete by: As directed    Home infusion instructions - [facility]   Complete by: As directed    Instructions: Flush IV access with Sodium Chloride 0.9% and Heparin 10units/ml or 100units/ml   Change dressing on IV access line: Weekly and PRN   Instructions Cath Flo 225m Administer for PICC Line occlusion and as ordered by physician for other access device   [facility] pharmacist to adjust dose for: Vancomycin, Aminoglycosides and other anti-infective therapies as requested by physician   Increase activity slowly   Complete by: As directed    Method of administration may be changed at the discretion of home infusion pharmacist based upon assessment of the patient and/or caregiver's ability to self-administer the medication ordered   Complete by: As directed      Allergies as of 07/09/2019      Reactions   Procardia [nifedipine] Other (See Comments)   Other reaction(s): Unknown dizzy Gets very woozy with this medication      Medication List    TAKE these medications   amLODipine 10 MG tablet Commonly known as: NORVASC Take 10 mg by mouth daily.   apixaban 5 MG Tabs tablet Commonly known as: ELIQUIS Take 5 mg by mouth 2 (two) times daily.   atorvastatin 20 MG tablet Commonly known as: LIPITOR TAKE ONE (1) TABLET BY MOUTH EVERY DAY   budesonide 180 MCG/ACT inhaler Commonly known as: PULMICORT Inhale 1 puff into the lungs  daily.   ceFEPime  IVPB Commonly known as: MAXIPIME Inject 2 g into the vein every 12 (twelve) hours for 13 days. Indication:  Pseudomonas Bacteremia First Dose: No Last Day of Therapy:  07/21/19 Labs - Once weekly:  CBC/D and BMP, Labs - Every other week:  ESR and CRP Method of administration: IV Push Method of administration may be changed at the discretion of home infusion pharmacist based upon assessment of the patient and/or caregiver's ability to self-administer the medication ordered.   fluticasone 50 MCG/ACT nasal spray Commonly known as: FLONASE instill 2 sprays into each nostril once daily   ibuprofen 200 MG tablet Commonly known as: ADVIL Take 400 mg by mouth every 8 (eight) hours as needed for headache or mild pain.   isosorbide mononitrate 30 MG 24 hr tablet Commonly known as: IMDUR Take 30 mg by mouth daily.   ketoconazole 2 % cream Commonly known as: NIZORAL Apply to the feet and between toes QHS   meloxicam 7.5 MG tablet Commonly known as: MOBIC TAKE 1 TABLET(7.5 MG) BY MOUTH EVERY DAY   montelukast  10 MG tablet Commonly known as: SINGULAIR Take 10 mg by mouth daily.   multivitamin capsule Take 1 capsule by mouth daily.   nitroGLYCERIN 0.4 MG SL tablet Commonly known as: NITROSTAT Place 0.4 mg under the tongue every 5 (five) minutes as needed for chest pain.   NON FORMULARY   omeprazole 20 MG capsule Commonly known as: PRILOSEC TAKE ONE (1) CAPSULE EACH DAY   tamsulosin 0.4 MG Caps capsule Commonly known as: FLOMAX Take 1 capsule (0.4 mg total) by mouth 2 (two) times daily.   vitamin C 500 MG tablet Commonly known as: ASCORBIC ACID Take 500 mg by mouth daily.            Discharge Care Instructions  (From admission, onward)         Start     Ordered   07/09/19 0000  Change dressing on IV access line weekly and PRN  (Home infusion instructions - [facility] )     07/09/19 0946         Follow-up Information     Tsosie Billing, MD Follow up.   Specialty: Infectious Diseases Why: Call Joshua Frye office to confirm the date and time of your follow up visit Contact information: [place] [address] [phone]        Teodoro Spray, MD Follow up.   Specialty: Cardiology Why: If you haven't heard from them by Monday, call Joshua Frye office on Tuesday to confirm the date and time of your procedure Contact information: [place] [address] [phone]          Allergies  Allergen Reactions  . Procardia [Nifedipine] Other (See Comments)    Other reaction(s): Unknown dizzy Gets very woozy with this medication    Consultations:  Infectious disease   Procedures/Studies: DG Chest 2 View  Result Date: 07/05/2019 CLINICAL DATA:  Is fever EXAM: CHEST - 2 VIEW COMPARISON:  11/13/2016 FINDINGS: Left pacer remains in place, unchanged. Prior CABG. Tortuous thoracic aorta with calcifications. Heart is normal size. Scarring at the right lung base. No confluent opacities or effusions. No acute bony abnormality. IMPRESSION: No active cardiopulmonary disease. Electronically Signed   By: Rolm Baptise M.D.   On: 07/05/2019 15:29   CT CHEST W CONTRAST  Result Date: 07/08/2019 CLINICAL DATA:  Infection of unknown source, respiratory illness, question intra-abdominal affect ses, history coronary artery disease post MI, asthma, COPD, bronchiectasis, hypertension, atrial fibrillation, multiple skin cancers EXAM: CT CHEST, ABDOMEN, AND PELVIS WITH CONTRAST TECHNIQUE: Multidetector CT imaging of the chest, abdomen and pelvis was performed following the standard protocol during bolus administration of intravenous contrast. Sagittal and coronal MPR images reconstructed from axial data set. CONTRAST:  153m OMNIPAQUE IOHEXOL 300 MG/ML SOLN IV. Dilute oral contrast. COMPARISON:  CT angio chest 04/19/2015 FINDINGS: CT CHEST FINDINGS Cardiovascular:  Enlargement of cardiac chambers. Pacemaker leads RIGHT atrium and RIGHT ventricle. Postsurgical changes of CABG. Atherosclerotic calcifications aorta, proximal great vessels, and coronary arteries. No pericardial effusion. Pulmonary arteries unremarkable for non targeted exam. Mediastinum/Nodes: Base of cervical region normal appearance. No thoracic adenopathy. Esophagus unremarkable. Lungs/Pleura: Subpleural atelectasis versus scarring RIGHT middle lobe. Tiny calcified granuloma RIGHT upper lobe. Musculoskeletal: Osseous demineralization. Nonunion of sternotomy. CT ABDOMEN PELVIS FINDINGS Hepatobiliary: Cholelithiasis.  Liver unremarkable. Pancreas: Normal appearance Spleen: Normal appearance Adrenals/Urinary Tract: Adrenal glands normal appearance. Large cyst upper LEFT kidney posteriorly 5.9 x 5.6 cm image 70. Additional smaller renal cysts bilaterally. No solid renal mass, hydronephrosis or  urinary tract calcification. Ureters and bladder unremarkable. Stomach/Bowel: Extensive distal colonic diverticulosis without evidence of diverticulitis. Few RIGHT-sided colonic diverticula identified as well, uncomplicated. Stomach and small bowel loops normal appearance. Appendix not visualized, surgically absent by history Vascular/Lymphatic: Atherosclerotic calcifications aorta and iliac arteries. Aneurysmal dilatation mid to distal abdominal aorta up to 3.4 x 3.4 cm. No adenopathy. Reproductive: Prostatic enlargement gland measuring 5.6 x 4.8 x 4.5 cm (volume = 63 cm^3). Other: No free air or free fluid. Tiny umbilical hernia containing fat. No intra-abdominal or intrapelvic inflammatory process identified. Prior LEFT inguinal herniorrhaphy. Musculoskeletal: Degenerative disc disease changes L2-L3. Bones demineralized. IMPRESSION: Extensive distal colonic diverticulosis without evidence of diverticulitis. Cholelithiasis. BILATERAL renal cysts. Prostatic enlargement. Aneurysmal dilatation mid to distal abdominal aorta up  to 3.4 x 3.4 cm. Recommend followup by [place] in 3 years. This recommendation follows ACR consensus guidelines: White Paper of the ACR Incidental Findings Committee II on Vascular Findings. J Am Coll Radiol 2013; 26:378-588 Tiny umbilical hernia containing fat. Osseous nonunion of sternotomy. No acute intrathoracic, intra-abdominal or intrapelvic inflammatory process identified. Aortic Atherosclerosis (ICD10-I70.0). Electronically Signed   By: Lavonia Dana M.D.   On: 07/08/2019 12:03   US Abdomen Complete  Result Date: 07/08/2019 CLINICAL DATA:  Elevated LFTs. EXAM: ABDOMEN ULTRASOUND COMPLETE COMPARISON:  None. FINDINGS: Gallbladder: Multiple mobile gallstones. Borderline wall thickening visualized. No sonographic Murphy sign noted by sonographer. Common bile duct: Diameter: 3 mm, normal. Liver: No focal lesion identified. Within normal limits in parenchymal echogenicity. Portal vein is patent on color Doppler imaging with normal direction of blood flow towards the liver. IVC: No abnormality visualized. Pancreas: Visualized portion unremarkable. Spleen: Size and appearance within normal limits. Right Kidney: Length: 11.2 cm. Echogenicity within normal limits. 1.6 cm complex cyst. No hydronephrosis visualized. Left Kidney: Length: 11.2 cm. Echogenicity within normal limits. Two simple cysts measuring up to 5.5 cm. No mass or hydronephrosis visualized. Abdominal aorta: Infrarenal abdominal aortic aneurysm measuring up to 3.4 cm. Other findings: None. IMPRESSION: 1. Cholelithiasis with borderline wall thickening, equivocal for early acute cholecystitis. 2. 1.6 cm complex right renal cyst. Follow-up ultrasound in 6 months is recommended to evaluate for interval change. 3. 3.4 cm infrarenal abdominal aortic aneurysm. Recommend followup by ultrasound in 3 years. This recommendation follows ACR consensus guidelines: White Paper of the ACR Incidental Findings Committee II on Vascular Findings. J Am Coll Radiol 2013;  10:789-794. Aortic aneurysm NOS (ICD10-I71.9) Electronically Signed   By: Titus Dubin M.D.   On: 07/08/2019 10:02   CT ABDOMEN PELVIS W CONTRAST  Result Date: 07/08/2019 CLINICAL DATA:  Infection of unknown source, respiratory illness, question intra-abdominal affect ses, history coronary artery disease post MI, asthma, COPD, bronchiectasis, hypertension, atrial fibrillation, multiple skin cancers EXAM: CT CHEST, ABDOMEN, AND PELVIS WITH CONTRAST TECHNIQUE: Multidetector CT imaging of the chest, abdomen and pelvis was performed following the standard protocol during bolus administration of intravenous contrast. Sagittal and coronal MPR images reconstructed from axial data set. CONTRAST:  176m OMNIPAQUE IOHEXOL 300 MG/ML SOLN IV. Dilute oral contrast. COMPARISON:  CT angio chest 04/19/2015 FINDINGS: CT CHEST FINDINGS Cardiovascular: Enlargement of cardiac chambers. Pacemaker leads RIGHT atrium and RIGHT ventricle. Postsurgical changes of CABG. Atherosclerotic calcifications aorta, proximal great vessels, and coronary arteries. No pericardial effusion. Pulmonary arteries unremarkable for non targeted exam. Mediastinum/Nodes: Base of cervical region normal appearance. No thoracic adenopathy. Esophagus unremarkable. Lungs/Pleura: Subpleural atelectasis versus scarring RIGHT middle lobe. Tiny calcified granuloma RIGHT upper lobe. Musculoskeletal: Osseous demineralization. Nonunion of sternotomy. CT ABDOMEN PELVIS FINDINGS  Hepatobiliary: Cholelithiasis.  Liver unremarkable. Pancreas: Normal appearance Spleen: Normal appearance Adrenals/Urinary Tract: Adrenal glands normal appearance. Large cyst upper LEFT kidney posteriorly 5.9 x 5.6 cm image 70. Additional smaller renal cysts bilaterally. No solid renal mass, hydronephrosis or urinary tract calcification. Ureters and bladder unremarkable. Stomach/Bowel: Extensive distal colonic diverticulosis without evidence of diverticulitis. Few RIGHT-sided colonic  diverticula identified as well, uncomplicated. Stomach and small bowel loops normal appearance. Appendix not visualized, surgically absent by history Vascular/Lymphatic: Atherosclerotic calcifications aorta and iliac arteries. Aneurysmal dilatation mid to distal abdominal aorta up to 3.4 x 3.4 cm. No adenopathy. Reproductive: Prostatic enlargement gland measuring 5.6 x 4.8 x 4.5 cm (volume = 63 cm^3). Other: No free air or free fluid. Tiny umbilical hernia containing fat. No intra-abdominal or intrapelvic inflammatory process identified. Prior LEFT inguinal herniorrhaphy. Musculoskeletal: Degenerative disc disease changes L2-L3. Bones demineralized. IMPRESSION: Extensive distal colonic diverticulosis without evidence of diverticulitis. Cholelithiasis. BILATERAL renal cysts. Prostatic enlargement. Aneurysmal dilatation mid to distal abdominal aorta up to 3.4 x 3.4 cm. Recommend followup by [place] in 3 years. This recommendation follows ACR consensus guidelines: White Paper of the ACR Incidental Findings Committee II on Vascular Findings. J Am Coll Radiol 2013; 53:976-734 Tiny umbilical hernia containing fat. Osseous nonunion of sternotomy. No acute intrathoracic, intra-abdominal or intrapelvic inflammatory process identified. Aortic Atherosclerosis (ICD10-I70.0). Electronically Signed   By: Lavonia Dana M.D.   On: 07/08/2019 12:03   DG Chest Port 1 View  Result Date: 07/08/2019 CLINICAL DATA:  PICC line insertion EXAM: PORTABLE CHEST 1 VIEW COMPARISON:  07/08/2019 FINDINGS: Right arm PICC line tip is in the mid to distal SVC. Left chest wall pacer device is noted with leads in the right atrial appendage and right ventricle. Mild cardiac enlargement. Previous CABG procedure. No airspace densities. No pleural effusion or edema. IMPRESSION: Right arm PICC line tip is in the mid to distal SVC. Electronically Signed   By: Kerby Moors M.D.   On: 07/08/2019 19:33   ECHOCARDIOGRAM COMPLETE  Result Date: 07/08/2019     ECHOCARDIOGRAM REPORT   Patient Name:   Joshua Frye Date of Exam: 07/08/2019 Medical Rec #:  [id number]    Height:       69.0 in Accession #:    [id number]   Weight:       178.0 lb Date of Birth:  [birth year]     BSA:          1.966 m Patient Age:    39 years     BP:           155/78 mmHg Patient Gender: M            HR:           60 bpm. Exam Location:  [facility] Procedure: 2D Echo, Color Doppler and Cardiac Doppler Indications:     R78.81 Bacteremia  History:         Patient has no prior history of Echocardiogram examinations.                  CAD, Pacemaker and Prior CABG, COPD; Risk Factors:Hypertension                  and Dyslipidemia.  Sonographer:     Charmayne Sheer RDCS (AE) Referring Phys:  [id number] Tsosie Billing Diagnosing Phys: Yolonda Kida MD IMPRESSIONS  1. Left ventricular ejection fraction, by estimation, is 40 to 45%. The left ventricle has mildly decreased function. The left ventricle demonstrates global hypokinesis. Left  ventricular diastolic parameters were normal.  2. Right ventricular systolic function is normal. The right ventricular size is normal. There is moderately elevated pulmonary artery systolic pressure.  3. Left atrial size was mild to moderately dilated.  4. The mitral valve is normal in structure. No evidence of mitral valve regurgitation.  5. The aortic valve is normal in structure. Aortic valve regurgitation is not visualized. FINDINGS  Left Ventricle: Left ventricular ejection fraction, by estimation, is 40 to 45%. The left ventricle has mildly decreased function. The left ventricle demonstrates global hypokinesis. The left ventricular internal cavity size was normal in size. There is  no left ventricular hypertrophy. Left ventricular diastolic parameters were normal. Right Ventricle: The right ventricular size is normal. No increase in right ventricular wall thickness. Right ventricular systolic function is normal. There is moderately elevated pulmonary artery systolic  pressure. The tricuspid regurgitant velocity is 3.12 m/s, and with an assumed right atrial pressure of 10 mmHg, the estimated right ventricular systolic pressure is 29.4 mmHg. Left Atrium: Left atrial size was mild to moderately dilated. Right Atrium: Right atrial size was normal in size. Pericardium: There is no evidence of pericardial effusion. Mitral Valve: The mitral valve is normal in structure. No evidence of mitral valve regurgitation. MV peak gradient, 2.7 mmHg. The mean mitral valve gradient is 1.0 mmHg. Tricuspid Valve: The tricuspid valve is normal in structure. Tricuspid valve regurgitation is not demonstrated. Aortic Valve: The aortic valve is normal in structure. Aortic valve regurgitation is not visualized. Aortic valve mean gradient measures 3.0 mmHg. Aortic valve peak gradient measures 7.3 mmHg. Aortic valve area, by VTI measures 2.38 cm. Pulmonic Valve: The pulmonic valve was normal in structure. Pulmonic valve regurgitation is not visualized. Aorta: The aortic arch was not well visualized. IAS/Shunts: The atrial septum is grossly normal.  LEFT VENTRICLE PLAX 2D LVIDd:         5.68 cm  Diastology LVIDs:         4.05 cm  LV e' lateral:   3.48 cm/s LV PW:         1.08 cm  LV E/e' lateral: 23.0 LV IVS:        1.05 cm  LV e' medial:    4.03 cm/s LVOT diam:     2.10 cm  LV E/e' medial:  19.9 LV SV:         53 LV SV Index:   27 LVOT Area:     3.46 cm  RIGHT VENTRICLE RV Basal diam:  3.19 cm LEFT ATRIUM             Index       RIGHT ATRIUM           Index LA diam:        5.00 cm 2.54 cm/m  RA Area:     19.40 cm LA Vol (A2C):   56.0 ml 28.48 ml/m RA Volume:   46.80 ml  23.80 ml/m LA Vol (A4C):   47.3 ml 24.06 ml/m LA Biplane Vol: 52.5 ml 26.70 ml/m  AORTIC VALVE                   PULMONIC VALVE AV Area (Vmax):    2.50 cm    PV Vmax:       1.06 m/s AV Area (Vmean):   2.55 cm    PV Vmean:      61.000 cm/s AV Area (VTI):     2.38 cm    PV VTI:  0.185 m AV Vmax:           135.00 cm/s PV Peak  grad:  4.5 mmHg AV Vmean:          83.000 cm/s PV Mean grad:  2.0 mmHg AV VTI:            0.221 m AV Peak Grad:      7.3 mmHg AV Mean Grad:      3.0 mmHg LVOT Vmax:         97.50 cm/s LVOT Vmean:        61.200 cm/s LVOT VTI:          0.152 m LVOT/AV VTI ratio: 0.69  AORTA Ao Root diam: 3.60 cm MITRAL VALVE               TRICUSPID VALVE MV Area (PHT): 3.19 cm    TR Peak grad:   38.9 mmHg MV Peak grad:  2.7 mmHg    TR Vmax:        312.00 cm/s MV Mean grad:  1.0 mmHg MV Vmax:       0.82 m/s    SHUNTS MV Vmean:      52.3 cm/s   Systemic VTI:  0.15 m MV Decel Time: 238 msec    Systemic Diam: 2.10 cm MV E velocity: 80.05 cm/s Yolonda Kida MD Electronically signed by Yolonda Kida MD Signature Date/Time: 07/08/2019/1:27:06 PM    Final    [place] EKG SITE RITE  Result Date: 07/08/2019 If Site Rite image not attached, placement could not be confirmed due to current cardiac rhythm.      Subjective: Feeling well.  No fever, confusion, chest pain, cough, urinary symptoms.  Discharge Exam: Vitals:   07/09/19 0013 07/09/19 0855  BP: (!) 153/99 136/84  Pulse: 61 60  Resp: 20 18  Temp: 97.9 F (36.6 C) 98 F (36.7 C)  SpO2: 99% 98%   Vitals:   07/08/19 0832 07/08/19 1610 07/09/19 0013 07/09/19 0855  BP: (!) 155/78 127/84 (!) 153/99 136/84  Pulse: 63 63 61 60  Resp: '17 17 20 18  ' Temp: 98.7 F (37.1 C) 98.2 F (36.8 C) 97.9 F (36.6 C) 98 F (36.7 C)  TempSrc: Oral   Oral  SpO2: 96% 99% 99% 98%    General: Pt is alert, awake, not in acute distress Cardiovascular: RRR, nl S1-S2, no murmurs appreciated.   No LE edema.   Respiratory: Normal respiratory rate and rhythm.  CTAB without rales or wheezes. Abdominal: Abdomen soft and non-tender.  No distension or HSM.   Neuro/Psych: Strength symmetric in upper and lower extremities.  Judgment and insight appear normal.   The results of significant diagnostics from this hospitalization (including imaging, microbiology, ancillary and laboratory)  are listed below for reference.     Microbiology: Recent Results (from the past 240 hour(s))  Blood culture (single)     Status: Abnormal   Collection Time: 07/05/19  2:52 PM   Specimen: BLOOD  Result Value Ref Range Status   Specimen Description   Final    BLOOD RAC Performed at [facility], [address], [place], [place] [address]    Special Requests   Final    BOTTLES DRAWN AEROBIC AND ANAEROBIC Blood Culture results may not be optimal due to an excessive volume of blood received in culture bottles Performed at [facility], [address], [place], [place] [address]    Culture  Setup Time   Final  GRAM NEGATIVE RODS AEROBIC BOTTLE ONLY CRITICAL RESULT CALLED TO, READ BACK BY AND VERIFIED WITH: JASON ROBBINS ON 07/06/19 AT 2054 AKT Performed at [facility], [place] [address], [place], [place] [address]    Culture PSEUDOMONAS AERUGINOSA (A)  Final   Report Status 07/09/2019 FINAL  Final   Organism ID, Bacteria PSEUDOMONAS AERUGINOSA  Final      Susceptibility   Pseudomonas aeruginosa - MIC*    CEFTAZIDIME <=1 SENSITIVE Sensitive     CIPROFLOXACIN <=0.25 SENSITIVE Sensitive     GENTAMICIN <=1 SENSITIVE Sensitive     IMIPENEM 2 SENSITIVE Sensitive     PIP/TAZO <=4 SENSITIVE Sensitive     CEFEPIME <=1 SENSITIVE Sensitive     * PSEUDOMONAS AERUGINOSA  Blood Culture ID Panel (Reflexed)     Status: Abnormal   Collection Time: 07/05/19  2:52 PM  Result Value Ref Range Status   Enterococcus species NOT DETECTED NOT DETECTED Final   Listeria monocytogenes NOT DETECTED NOT DETECTED Final   Staphylococcus species NOT DETECTED NOT DETECTED Final   Staphylococcus aureus (BCID) NOT DETECTED NOT DETECTED Final   Streptococcus species NOT DETECTED NOT DETECTED Final   Streptococcus agalactiae NOT DETECTED NOT DETECTED Final   Streptococcus pneumoniae NOT DETECTED NOT DETECTED Final   Streptococcus pyogenes NOT DETECTED NOT DETECTED Final   Acinetobacter  baumannii NOT DETECTED NOT DETECTED Final   Enterobacteriaceae species NOT DETECTED NOT DETECTED Final   Enterobacter cloacae complex NOT DETECTED NOT DETECTED Final   Escherichia coli NOT DETECTED NOT DETECTED Final   Klebsiella oxytoca NOT DETECTED NOT DETECTED Final   Klebsiella pneumoniae NOT DETECTED NOT DETECTED Final   Proteus species NOT DETECTED NOT DETECTED Final   Serratia marcescens NOT DETECTED NOT DETECTED Final   Carbapenem resistance NOT DETECTED NOT DETECTED Final   Haemophilus influenzae NOT DETECTED NOT DETECTED Final   Neisseria meningitidis NOT DETECTED NOT DETECTED Final   Pseudomonas aeruginosa DETECTED (A) NOT DETECTED Final    Comment: CRITICAL RESULT CALLED TO, READ BACK BY AND VERIFIED WITH: JASON ROBBINS ON 07/06/19 AT 2054 AKT    Candida albicans NOT DETECTED NOT DETECTED Final   Candida glabrata NOT DETECTED NOT DETECTED Final   Candida krusei NOT DETECTED NOT DETECTED Final   Candida parapsilosis NOT DETECTED NOT DETECTED Final   Candida tropicalis NOT DETECTED NOT DETECTED Final    Comment: Performed at [facility], [address] [place], [place] [address]  Blood culture (single)     Status: None (Preliminary result)   Collection Time: 07/05/19  5:20 PM   Specimen: BLOOD  Result Value Ref Range Status   Specimen Description BLOOD BLOOD RIGHT WRIST  Final   Special Requests   Final    BOTTLES DRAWN AEROBIC AND ANAEROBIC Blood Culture results may not be optimal due to an excessive volume of blood received in culture bottles   Culture   Final    NO GROWTH 4 DAYS Performed at [facility], [address] [place],  [address]    Report Status PENDING  Incomplete  SARS Coronavirus 2 by RT PCR (hospital order, performed in [place] hospital lab) Nasopharyngeal Nasopharyngeal Swab     Status: None   Collection Time: 07/05/19  6:31 PM   Specimen: Nasopharyngeal Swab  Result Value Ref Range Status   SARS Coronavirus 2 NEGATIVE  NEGATIVE Final    Comment: (NOTE) SARS-CoV-2 target nucleic acids are NOT DETECTED. The SARS-CoV-2 RNA is generally detectable in upper and lower  respiratory specimens during the acute phase of infection. The lowest concentration of SARS-CoV-2 viral copies this assay can detect is 250 copies / mL. A negative result does not preclude SARS-CoV-2 infection and should not be used as the sole basis for treatment or other patient management decisions.  A negative result may occur with improper specimen collection / handling, submission of specimen other than nasopharyngeal swab, presence of viral mutation(s) within the areas targeted by this assay, and inadequate number of viral copies (<250 copies / mL). A negative result must be combined with clinical observations, patient history, and epidemiological information. Fact Sheet for Patients:   StrictlyIdeas.no Fact Sheet for Healthcare Providers: BankingDealers.co.za This test is not yet approved or cleared  by the [place] FDA and has been authorized for detection and/or diagnosis of SARS-CoV-2 by FDA under an Emergency Use Authorization (EUA).  This EUA will remain in effect (meaning this test can be used) for the duration of the COVID-19 declaration under Section 564(b)(1) of the Act, 21 U.S.C. section 360bbb-3(b)(1), unless the authorization is terminated or revoked sooner. Performed at [facility], [address] [place], [place] [address]   Blood culture (routine x 2)     Status: None (Preliminary result)   Collection Time: 07/06/19 11:41 PM   Specimen: Right Antecubital; Blood  Result Value Ref Range Status   Specimen Description RIGHT ANTECUBITAL  Final   Special Requests   Final    BOTTLES DRAWN AEROBIC AND ANAEROBIC Blood Culture adequate volume   Culture   Final    NO GROWTH 2 DAYS Performed at [facility], [address], [place], [place] [address]     Report Status PENDING  Incomplete  Blood culture (routine x 2)     Status: None (Preliminary result)   Collection Time: 07/06/19 11:46 PM   Specimen: BLOOD LEFT FOREARM  Result Value Ref Range Status   Specimen Description BLOOD LEFT FOREARM  Final   Special Requests   Final    BOTTLES DRAWN AEROBIC AND ANAEROBIC Blood Culture adequate volume   Culture   Final    NO GROWTH 2 DAYS Performed at [facility], [address], [place], [place] [address]    Report Status PENDING  Incomplete     Labs: BNP (last 3 results) No results for input(s): BNP in the last 8760 hours. Basic Metabolic Panel: Recent Labs  Lab 07/05/19 1452 07/06/19 2342 07/08/19 0433 07/09/19 0638  NA 142 140 143 141  K 4.2 3.3* 4.0 4.0  CL 109 109 113* 110  CO2 '23 25 23 24  ' GLUCOSE 111* 111* 103* 90  BUN 21 24* 20 16  CREATININE 1.33* 1.56* 1.25* 1.15  CALCIUM 9.3 8.3* 8.6* 8.7*   Liver Function Tests: Recent Labs  Lab 07/05/19 1452 07/06/19 2342 07/08/19 0433  AST 22 22 34  ALT 19 20 33  ALKPHOS 90 66 86  BILITOT 1.5* 1.1 1.4*  PROT 6.8 5.7* 5.9*  ALBUMIN 4.4 3.5 3.4*   No results for input(s): LIPASE, AMYLASE in the last 168 hours. No results for input(s): AMMONIA in the last 168 hours. CBC: Recent Labs  Lab 07/05/19 1452 07/06/19 2342 07/08/19 0433 07/09/19 0638  WBC 6.6 11.0* 7.8 7.6  HGB 16.4 14.9 15.0 15.1  HCT 47.7 44.8 44.9 43.6  MCV 88.0 91.6 90.0 87.4  PLT 182 138* 136* 149*   Cardiac Enzymes: No results for input(s): CKTOTAL, CKMB, CKMBINDEX, TROPONINI in the last 168 hours. BNP: Invalid input(s): POCBNP  CBG: No results for input(s): GLUCAP in the last 168 hours. D-Dimer No results for input(s): DDIMER in the last 72 hours. Hgb A1c No results for input(s): HGBA1C in the last 72 hours. Lipid Profile No results for input(s): CHOL, HDL, LDLCALC, TRIG, CHOLHDL, LDLDIRECT in the last 72 hours. Thyroid function studies No results for input(s): TSH, T4TOTAL,  T3FREE, THYROIDAB in the last 72 hours.  Invalid input(s): FREET3 Anemia work up No results for input(s): VITAMINB12, FOLATE, FERRITIN, TIBC, IRON, RETICCTPCT in the last 72 hours. Urinalysis    Component Value Date/Time   COLORURINE YELLOW (A) 07/05/2019 1456   APPEARANCEUR HAZY (A) 07/05/2019 1456   APPEARANCEUR Clear 06/11/2017 1428   LABSPEC 1.015 07/05/2019 1456   PHURINE 5.0 07/05/2019 1456   GLUCOSEU NEGATIVE 07/05/2019 1456   HGBUR NEGATIVE 07/05/2019 [place] 07/05/2019 1456   BILIRUBINUR Negative 06/11/2017 1428   KETONESUR NEGATIVE 07/05/2019 1456   PROTEINUR NEGATIVE 07/05/2019 1456   NITRITE NEGATIVE 07/05/2019 [place] 07/05/2019 1456   Sepsis Labs Invalid input(s): PROCALCITONIN,  WBC,  LACTICIDVEN Microbiology Recent Results (from the past 240 hour(s))  Blood culture (single)     Status: Abnormal   Collection Time: 07/05/19  2:52 PM   Specimen: BLOOD  Result Value Ref Range Status   Specimen Description   Final    BLOOD RAC Performed at [facility], [address], [place], [place] [address]    Special Requests   Final    BOTTLES DRAWN AEROBIC AND ANAEROBIC Blood Culture results may not be optimal due to an excessive volume of blood received in culture bottles Performed at [facility], [address] [place], [place] [address]    Culture  Setup Time   Final    GRAM NEGATIVE RODS AEROBIC BOTTLE ONLY CRITICAL RESULT CALLED TO, READ BACK BY AND VERIFIED WITH: JASON ROBBINS ON 07/06/19 AT 2054 AKT Performed at [facility], [place] [address] [place], [place] [address]    Culture PSEUDOMONAS AERUGINOSA (A)  Final   Report Status 07/09/2019 FINAL  Final   Organism ID, Bacteria PSEUDOMONAS AERUGINOSA  Final      Susceptibility   Pseudomonas aeruginosa - MIC*    CEFTAZIDIME <=1 SENSITIVE Sensitive     CIPROFLOXACIN <=0.25 SENSITIVE Sensitive     GENTAMICIN <=1 SENSITIVE Sensitive     IMIPENEM  2 SENSITIVE Sensitive     PIP/TAZO <=4 SENSITIVE Sensitive     CEFEPIME <=1 SENSITIVE Sensitive     * PSEUDOMONAS AERUGINOSA  Blood Culture ID Panel (Reflexed)     Status: Abnormal   Collection Time: 07/05/19  2:52 PM  Result Value Ref Range Status   Enterococcus species NOT DETECTED NOT DETECTED Final   Listeria monocytogenes NOT DETECTED NOT DETECTED Final   Staphylococcus species NOT DETECTED NOT DETECTED Final   Staphylococcus aureus (BCID) NOT DETECTED NOT DETECTED Final   Streptococcus species NOT DETECTED NOT DETECTED Final   Streptococcus agalactiae NOT DETECTED NOT DETECTED Final   Streptococcus pneumoniae NOT DETECTED NOT DETECTED Final   Streptococcus pyogenes NOT DETECTED NOT DETECTED Final   Acinetobacter baumannii NOT DETECTED NOT DETECTED Final   Enterobacteriaceae species NOT DETECTED NOT DETECTED Final   Enterobacter cloacae complex NOT DETECTED NOT DETECTED Final   Escherichia coli NOT DETECTED NOT DETECTED Final   Klebsiella oxytoca NOT DETECTED NOT DETECTED Final   Klebsiella pneumoniae NOT DETECTED NOT DETECTED Final   Proteus species NOT DETECTED NOT DETECTED Final  Serratia marcescens NOT DETECTED NOT DETECTED Final   Carbapenem resistance NOT DETECTED NOT DETECTED Final   Haemophilus influenzae NOT DETECTED NOT DETECTED Final   Neisseria meningitidis NOT DETECTED NOT DETECTED Final   Pseudomonas aeruginosa DETECTED (A) NOT DETECTED Final    Comment: CRITICAL RESULT CALLED TO, READ BACK BY AND VERIFIED WITH: JASON ROBBINS ON 07/06/19 AT 2054 AKT    Candida albicans NOT DETECTED NOT DETECTED Final   Candida glabrata NOT DETECTED NOT DETECTED Final   Candida krusei NOT DETECTED NOT DETECTED Final   Candida parapsilosis NOT DETECTED NOT DETECTED Final   Candida tropicalis NOT DETECTED NOT DETECTED Final    Comment: Performed at [facility], [address] [place], [place] [address]  Blood culture (single)     Status: None (Preliminary result)    Collection Time: 07/05/19  5:20 PM   Specimen: BLOOD  Result Value Ref Range Status   Specimen Description BLOOD BLOOD RIGHT WRIST  Final   Special Requests   Final    BOTTLES DRAWN AEROBIC AND ANAEROBIC Blood Culture results may not be optimal due to an excessive volume of blood received in culture bottles   Culture   Final    NO GROWTH 4 DAYS Performed at [facility], [address] [place], [place] [address]    Report Status PENDING  Incomplete  SARS Coronavirus 2 by RT PCR (hospital order, performed in [place] hospital lab) Nasopharyngeal Nasopharyngeal Swab     Status: None   Collection Time: 07/05/19  6:31 PM   Specimen: Nasopharyngeal Swab  Result Value Ref Range Status   SARS Coronavirus 2 NEGATIVE NEGATIVE Final    Comment: (NOTE) SARS-CoV-2 target nucleic acids are NOT DETECTED. The SARS-CoV-2 RNA is generally detectable in upper and lower respiratory specimens during the acute phase of infection. The lowest concentration of SARS-CoV-2 viral copies this assay can detect is 250 copies / mL. A negative result does not preclude SARS-CoV-2 infection and should not be used as the sole basis for treatment or other patient management decisions.  A negative result may occur with improper specimen collection / handling, submission of specimen other than nasopharyngeal swab, presence of viral mutation(s) within the areas targeted by this assay, and inadequate number of viral copies (<250 copies / mL). A negative result must be combined with clinical observations, patient history, and epidemiological information. Fact Sheet for Patients:   StrictlyIdeas.no Fact Sheet for Healthcare Providers: BankingDealers.co.za This test is not yet approved or cleared  by the [place] FDA and has been authorized for detection and/or diagnosis of SARS-CoV-2 by FDA under an Emergency Use Authorization (EUA).  This EUA will remain in  effect (meaning this test can be used) for the duration of the COVID-19 declaration under Section 564(b)(1) of the Act, 21 U.S.C. section 360bbb-3(b)(1), unless the authorization is terminated or revoked sooner. Performed at [facility], [address] [place], [place] [address]   Blood culture (routine x 2)     Status: None (Preliminary result)   Collection Time: 07/06/19 11:41 PM   Specimen: Right Antecubital; Blood  Result Value Ref Range Status   Specimen Description RIGHT ANTECUBITAL  Final   Special Requests   Final    BOTTLES DRAWN AEROBIC AND ANAEROBIC Blood Culture adequate volume   Culture   Final    NO GROWTH 2 DAYS Performed at [facility], [address], [place], [place] [address]    Report Status PENDING  Incomplete  Blood  culture (routine x 2)     Status: None (Preliminary result)   Collection Time: 07/06/19 11:46 PM   Specimen: BLOOD LEFT FOREARM  Result Value Ref Range Status   Specimen Description BLOOD LEFT FOREARM  Final   Special Requests   Final    BOTTLES DRAWN AEROBIC AND ANAEROBIC Blood Culture adequate volume   Culture   Final    NO GROWTH 2 DAYS Performed at [facility], [place], [place] [address]    Report Status PENDING  Incomplete     Time coordinating discharge: 25 minutes The [place] controlled substances registry was reviewed for this patient       SIGNED:   Edwin Dada, MD  [facility] 07/09/2019, 3:54 PM

## 2019-07-10 LAB — CULTURE, BLOOD (SINGLE): Culture: NO GROWTH

## 2019-07-12 LAB — CULTURE, BLOOD (ROUTINE X 2)
Culture: NO GROWTH
Culture: NO GROWTH
Special Requests: ADEQUATE
Special Requests: ADEQUATE

## 2019-07-13 ENCOUNTER — Other Ambulatory Visit: Payer: Self-pay | Admitting: Internal Medicine

## 2019-07-13 ENCOUNTER — Other Ambulatory Visit
Admission: RE | Admit: 2019-07-13 | Discharge: 2019-07-13 | Disposition: A | Discharge status: Home / Self Care | Payer: Medicare Other | Source: Ambulatory Visit | Attending: Cardiology | Admitting: Cardiology

## 2019-07-13 ENCOUNTER — Other Ambulatory Visit: Payer: Self-pay | Admitting: Cardiology

## 2019-07-13 ENCOUNTER — Other Ambulatory Visit: Payer: Self-pay

## 2019-07-13 DIAGNOSIS — Z01812 Encounter for preprocedural laboratory examination: Secondary | ICD-10-CM | POA: Insufficient documentation

## 2019-07-13 DIAGNOSIS — Z20822 Contact with and (suspected) exposure to covid-19: Secondary | ICD-10-CM | POA: Insufficient documentation

## 2019-07-13 DIAGNOSIS — Z01818 Encounter for other preprocedural examination: Secondary | ICD-10-CM

## 2019-07-14 ENCOUNTER — Ambulatory Visit
Admission: RE | Admit: 2019-07-14 | Discharge: 2019-07-14 | Disposition: A | Discharge status: Home / Self Care | Payer: Medicare Other | Attending: Cardiology | Admitting: Cardiology

## 2019-07-14 ENCOUNTER — Other Ambulatory Visit: Payer: Self-pay

## 2019-07-14 ENCOUNTER — Encounter: Payer: Self-pay | Admitting: Cardiology

## 2019-07-14 ENCOUNTER — Ambulatory Visit: Payer: Medicare Other | Attending: Infectious Diseases | Admitting: Infectious Diseases

## 2019-07-14 ENCOUNTER — Encounter: Payer: Self-pay | Admitting: Infectious Diseases

## 2019-07-14 ENCOUNTER — Encounter
Admission: RE | Disposition: A | Discharge status: Home / Self Care | Payer: Self-pay | Source: Home / Self Care | Attending: Cardiology

## 2019-07-14 ENCOUNTER — Ambulatory Visit
Admission: RE | Admit: 2019-07-14 | Discharge: 2019-07-14 | Disposition: A | Discharge status: Home / Self Care | Payer: Medicare Other | Source: Home / Self Care | Attending: Cardiology | Admitting: Cardiology

## 2019-07-14 VITALS — BP 147/83 | HR 64 | Temp 98.4°F | Resp 16 | Ht 69.0 in | Wt 177.0 lb

## 2019-07-14 DIAGNOSIS — Z951 Presence of aortocoronary bypass graft: Secondary | ICD-10-CM | POA: Diagnosis not present

## 2019-07-14 DIAGNOSIS — R7881 Bacteremia: Secondary | ICD-10-CM

## 2019-07-14 DIAGNOSIS — Z79899 Other long term (current) drug therapy: Secondary | ICD-10-CM | POA: Diagnosis not present

## 2019-07-14 DIAGNOSIS — K219 Gastro-esophageal reflux disease without esophagitis: Secondary | ICD-10-CM | POA: Diagnosis not present

## 2019-07-14 DIAGNOSIS — I2510 Atherosclerotic heart disease of native coronary artery without angina pectoris: Secondary | ICD-10-CM | POA: Insufficient documentation

## 2019-07-14 DIAGNOSIS — Z950 Presence of cardiac pacemaker: Secondary | ICD-10-CM | POA: Insufficient documentation

## 2019-07-14 DIAGNOSIS — J449 Chronic obstructive pulmonary disease, unspecified: Secondary | ICD-10-CM | POA: Insufficient documentation

## 2019-07-14 DIAGNOSIS — E039 Hypothyroidism, unspecified: Secondary | ICD-10-CM | POA: Diagnosis not present

## 2019-07-14 DIAGNOSIS — I252 Old myocardial infarction: Secondary | ICD-10-CM | POA: Diagnosis not present

## 2019-07-14 DIAGNOSIS — Z7901 Long term (current) use of anticoagulants: Secondary | ICD-10-CM | POA: Insufficient documentation

## 2019-07-14 DIAGNOSIS — Z85828 Personal history of other malignant neoplasm of skin: Secondary | ICD-10-CM | POA: Diagnosis not present

## 2019-07-14 DIAGNOSIS — B965 Pseudomonas (aeruginosa) (mallei) (pseudomallei) as the cause of diseases classified elsewhere: Secondary | ICD-10-CM

## 2019-07-14 DIAGNOSIS — Z833 Family history of diabetes mellitus: Secondary | ICD-10-CM | POA: Insufficient documentation

## 2019-07-14 DIAGNOSIS — I4891 Unspecified atrial fibrillation: Secondary | ICD-10-CM | POA: Insufficient documentation

## 2019-07-14 DIAGNOSIS — I25718 Atherosclerosis of autologous vein coronary artery bypass graft(s) with other forms of angina pectoris: Secondary | ICD-10-CM | POA: Insufficient documentation

## 2019-07-14 DIAGNOSIS — I480 Paroxysmal atrial fibrillation: Secondary | ICD-10-CM | POA: Diagnosis not present

## 2019-07-14 DIAGNOSIS — Z7951 Long term (current) use of inhaled steroids: Secondary | ICD-10-CM | POA: Diagnosis not present

## 2019-07-14 DIAGNOSIS — G4733 Obstructive sleep apnea (adult) (pediatric): Secondary | ICD-10-CM | POA: Diagnosis not present

## 2019-07-14 DIAGNOSIS — N400 Enlarged prostate without lower urinary tract symptoms: Secondary | ICD-10-CM | POA: Insufficient documentation

## 2019-07-14 DIAGNOSIS — Z8249 Family history of ischemic heart disease and other diseases of the circulatory system: Secondary | ICD-10-CM | POA: Insufficient documentation

## 2019-07-14 DIAGNOSIS — I10 Essential (primary) hypertension: Secondary | ICD-10-CM | POA: Diagnosis not present

## 2019-07-14 DIAGNOSIS — E785 Hyperlipidemia, unspecified: Secondary | ICD-10-CM | POA: Insufficient documentation

## 2019-07-14 HISTORY — PX: TEE WITHOUT CARDIOVERSION: SHX5443

## 2019-07-14 LAB — SARS CORONAVIRUS 2 (TAT 6-24 HRS): SARS Coronavirus 2: NEGATIVE

## 2019-07-14 SURGERY — ECHOCARDIOGRAM, TRANSESOPHAGEAL
Anesthesia: Moderate Sedation

## 2019-07-14 MED ORDER — LIDOCAINE VISCOUS HCL 2 % MT SOLN
OROMUCOSAL | Status: AC
  Filled 2019-07-14: qty 15

## 2019-07-14 MED ORDER — MIDAZOLAM HCL 2 MG/2ML IJ SOLN
INTRAMUSCULAR | Status: AC | PRN
  Administered 2019-07-14: 1 mg via INTRAVENOUS
  Administered 2019-07-14: 2 mg via INTRAVENOUS

## 2019-07-14 MED ORDER — SODIUM CHLORIDE 0.9 % IV SOLN: INTRAVENOUS | Status: DC

## 2019-07-14 MED ORDER — BUTAMBEN-TETRACAINE-BENZOCAINE 2-2-14 % EX AERO
INHALATION_SPRAY | CUTANEOUS | Status: AC
  Filled 2019-07-14: qty 5

## 2019-07-14 MED ORDER — FENTANYL CITRATE (PF) 100 MCG/2ML IJ SOLN
INTRAMUSCULAR | Status: AC | PRN
  Administered 2019-07-14: 50 ug via INTRAVENOUS
  Administered 2019-07-14: 25 ug via INTRAVENOUS

## 2019-07-14 MED ORDER — FENTANYL CITRATE (PF) 100 MCG/2ML IJ SOLN
INTRAMUSCULAR | Status: AC
  Filled 2019-07-14: qty 2

## 2019-07-14 MED ORDER — MIDAZOLAM HCL 5 MG/5ML IJ SOLN
INTRAMUSCULAR | Status: AC
  Filled 2019-07-14: qty 5

## 2019-07-14 NOTE — Progress Notes (Signed)
NAME: Joshua Frye  DOB: 1935/10/22  MRN: [id number]  Date/Time: 07/14/2019 9:08 AM   Subjective:  ? Joshua Frye is a 84 y.o. male recently discharged from hospital after being treated for fever and bacteremia due to pseudomonas- He has Afib, CAD status post CABG and pacemaker and COPD. We could not identify the source of the bacteremia.  He had 1 out of 4 bottles positive for Pseudomonas CT chest subpleural atelectasis versus scarring right middle lobe. Is abdomen CT revealed large cyst upper left kidney With additional small renal cyst bilaterally, extensive distal colonic diverticulosis without evidence of diverticulitis and an aneurysmal dilatation mid to distal abdominal aorta of 3.4 into 3.4 cm. Urine analysis was normal.  Repeat blood culture was negative even before antibiotics were started.  So he very likely had transient bacteremia.  He was discharged on IV cefepime until 07/21/2019.  Ciprofloxacin was not used because of aneurysmal dilatation of aorta.  Because of the pacemaker lead he is undergoing a TEE today. Patient is feeling very well. No fever or chills or shortness of breath or cough. No abdominal pain or diarrhea or dysuria. No rash He says his wife is to undergo surgery in June and he he is taking her for an appointment next week.  He would very well like to finish his antibiotics and get the PICC line removed  next week. Past Medical History:  Diagnosis Date  . Anxiety   . Asthma   . BPH (benign prostatic hypertrophy) 2017  . Bronchiectasis ([place])   . COPD (chronic obstructive pulmonary disease) ([place])   . Coronary artery disease   . Dysrhythmia    asymptomatic pvcs  . GERD (gastroesophageal reflux disease)   . History of gout    several years ago  . HOH (hard of hearing)    Bilateral Hearing Aids  . Hyperlipidemia   . Hypertension   . Hypothyroidism   . Myocardial infarction ([place]) 1985  . Presence of permanent cardiac pacemaker   . Sleep apnea    OSA--C-PAP   . 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 Squamous cell carcinoma of arm, left 09/10/2017   L mid dorsum forearm   . Squamous cell carcinoma of arm, right 06/01/2014   R prox forearm   . Squamous cell carcinoma of hand, right 04/06/2018   R hand thumb webspace   . Squamous cell carcinoma of leg, left 05/24/2018   L mid lat pretibial   . Squamous cell carcinoma of leg, left 05/24/2018   L mid ant med thigh   . Squamous cell carcinoma of leg, left 05/24/2018   L med mid calf  . Squamous cell carcinoma of leg, right 01/16/2015   R lat calf  . Squamous cell carcinoma of skin 01/27/2013   R post forearm  . Squamous cell carcinoma, arm, right 04/27/2013   R prox dorsum forearm     Past Surgical History:  Procedure Laterality Date  . APPENDECTOMY    . COLONOSCOPY WITH PROPOFOL N/A 05/05/2017   Procedure: COLONOSCOPY WITH PROPOFOL;  Surgeon: Manya Silvas, MD;  Location: [facility] ENDOSCOPY;  Service: Endoscopy;  Laterality: N/A;  . CORONARY ANGIOPLASTY     x 2  . CORONARY ARTERY BYPASS GRAFT  1985  . INGUINAL HERNIA REPAIR Right 11/20/2015   Procedure: HERNIA REPAIR INGUINAL ADULT;  Surgeon: [name], MD;  Location: [facility] ORS;  Service: General;  Laterality: Right;  . INSERT / REPLACE /  REMOVE PACEMAKER    . PACEMAKER INSERTION Left 10/15/2016   Procedure: INSERTION PACEMAKER;  Surgeon: Isaias Cowman, MD;  Location: [facility] ORS;  Service: Cardiovascular;  Laterality: Left;  . PACEMAKER LEAD REMOVAL N/A 11/19/2016   Procedure: LEAD REVISION;  Surgeon: Isaias Cowman, MD;  Location: [facility] ORS;  Service: Cardiovascular;  Laterality: N/A;    Social History   Socioeconomic History  . Marital status: Married    Spouse name: Not on file  . Number of children: Not on file  . Years of education: Not on file  . Highest education level: Not on file  Occupational History  . Not on file  Tobacco Use  . Smoking status: Never Smoker  . Smokeless  tobacco: Never Used  Substance and Sexual Activity  . Alcohol use: No  . Drug use: No  . Sexual activity: Yes  Other Topics Concern  . Not on file  Social History Narrative  . Not on file   Social Determinants of Health   Financial Resource Strain:   . Difficulty of Paying Living Expenses:   Food Insecurity:   . Worried About Charity fundraiser in the Last Year:   . Arboriculturist in the Last Year:   Transportation Needs:   . Film/video editor (Medical):   [name] Lack of Transportation (Non-Medical):   Physical Activity:   . Days of Exercise per Week:   . Minutes of Exercise per Session:   Stress:   . Feeling of Stress :   Social Connections:   . Frequency of Communication with Friends and Family:   . Frequency of Social Gatherings with Friends and Family:   . Attends Religious Services:   . Active Member of Clubs or Organizations:   . Attends Archivist Meetings:   [name] Marital Status:   Intimate Partner Violence:   . Fear of Current or Ex-Partner:   . Emotionally Abused:   [name] Physically Abused:   . Sexually Abused:     Family History  Problem Relation Age of Onset  . Diabetes Mother    Allergies  Allergen Reactions  . Procardia [Nifedipine] Other (See Comments)    Other reaction(s): Unknown dizzy Gets very woozy with this medication  ? Current Outpatient Medications  Medication Sig Dispense Refill  . amLODipine (NORVASC) 10 MG tablet Take 10 mg by mouth daily.    [name] apixaban (ELIQUIS) 5 MG TABS tablet Take 5 mg by mouth 2 (two) times daily.    [name] atorvastatin (LIPITOR) 20 MG tablet TAKE ONE (1) TABLET BY MOUTH EVERY DAY    . budesonide (PULMICORT) 180 MCG/ACT inhaler Inhale 1 puff into the lungs daily.     [name] ceFEPime (MAXIPIME) IVPB Inject 2 g into the vein every 12 (twelve) hours for 13 days. Indication:  Pseudomonas Bacteremia First Dose: No Last Day of Therapy:  07/21/19 Labs - Once weekly:  CBC/D and BMP, Labs - Every other week:  ESR and CRP  Method of administration: IV Push Method of administration may be changed at the discretion of home infusion pharmacist based upon assessment of the patient and/or caregiver's ability to self-administer the medication ordered. 26 Units 0  . fluticasone (FLONASE) 50 MCG/ACT nasal spray instill 2 sprays into each nostril once daily  0  . ibuprofen (ADVIL,MOTRIN) 200 MG tablet Take 400 mg by mouth every 8 (eight) hours as needed for headache or mild pain.    . isosorbide mononitrate (IMDUR) 30 MG 24 hr tablet Take  30 mg by mouth daily.     [name] ketoconazole (NIZORAL) 2 % cream Apply to the feet and between toes QHS 60 g 6  . montelukast (SINGULAIR) 10 MG tablet Take 10 mg by mouth daily.     . Multiple Vitamin (MULTIVITAMIN) capsule Take 1 capsule by mouth daily.    . nitroGLYCERIN (NITROSTAT) 0.4 MG SL tablet Place 0.4 mg under the tongue every 5 (five) minutes as needed for chest pain.    . NON FORMULARY     . omeprazole (PRILOSEC) 20 MG capsule TAKE ONE (1) CAPSULE EACH DAY    . tamsulosin (FLOMAX) 0.4 MG CAPS capsule Take 1 capsule (0.4 mg total) by mouth 2 (two) times daily. 180 capsule 3  . vitamin C (ASCORBIC ACID) 500 MG tablet Take 500 mg by mouth daily.     No current facility-administered medications for this visit.     Abtx:  Anti-infectives (From admission, onward)   None      REVIEW OF SYSTEMS:  Const: negative fever, negative chills, negative weight loss Eyes: negative diplopia or visual changes, negative eye pain ENT: negative coryza, negative sore throat Resp: negative cough, hemoptysis, dyspnea Cards: negative for chest pain, palpitations, lower extremity edema GU: negative for frequency, dysuria and hematuria GI: Negative for abdominal pain, diarrhea, bleeding, constipation Skin: negative for rash and pruritus Heme: negative for easy bruising and gum/nose bleeding MS: negative for myalgias, arthralgias, back pain and muscle weakness Neurolo:negative for headaches,  dizziness, vertigo, memory problems  Psych: negative for feelings of anxiety, depression  Endocrine: negative for thyroid, diabetes Allergy/Immunology-as above  Objective:  VITALS:  BP (!) 147/83   Pulse 64   Temp 98.4 F (36.9 C) (Temporal)   Resp 16   Ht '5\' 9"'  (1.753 m)   Wt 177 lb (80.3 kg)   SpO2 95%   BMI 26.14 kg/m  PHYSICAL EXAM:  General: Alert, cooperative, no distress, appears stated age.  Head: Normocephalic, without obvious abnormality, atraumatic. Eyes: Conjunctivae clear, anicteric sclerae. Pupils are equal ENT Nares normal. No drainage or sinus tenderness. Lips, mucosa, and tongue normal. No Thrush Neck: Supple, symmetrical, no adenopathy, thyroid: non tender no carotid bruit and no JVD. Back: No CVA tenderness. Lungs: Bilateral air entry heart: Irregular  abdomen: Soft, non-tender,not distended. Bowel sounds normal. No masses Extremities: Rt arm PICc site is fine atraumatic, no cyanosis.  Mild ankle edema . No clubbing Skin: No rashes or lesions. Or bruising Lymph: Cervical, supraclavicular normal. Neurologic: Grossly non-focal Pertinent Labs Lab Results CBC    Component Value Date/Time   WBC 7.6 07/09/2019 0638   RBC 4.99 07/09/2019 0638   HGB 15.1 07/09/2019 0638   HCT 43.6 07/09/2019 0638   PLT 149 (L) 07/09/2019 0638   MCV 87.4 07/09/2019 0638   MCH 30.3 07/09/2019 0638   MCHC 34.6 07/09/2019 0638   RDW 14.5 07/09/2019 0638   LYMPHSABS 3.0 11/13/2016 1205   MONOABS 0.5 11/13/2016 1205   EOSABS 0.3 11/13/2016 1205   BASOSABS 0.0 11/13/2016 1205    CMP Latest Ref Rng & Units 07/09/2019 07/08/2019 07/06/2019  Glucose 70 - 99 mg/dL 90 103(H) 111(H)  BUN 8 - 23 mg/dL 16 20 24(H)  Creatinine 0.61 - 1.24 mg/dL 1.15 1.25(H) 1.56(H)  Sodium 135 - 145 mmol/L 141 143 140  Potassium 3.5 - 5.1 mmol/L 4.0 4.0 3.3(L)  Chloride 98 - 111 mmol/L 110 113(H) 109  CO2 22 - 32 mmol/L '24 23 25  ' Calcium 8.9 - 10.3 mg/dL 8.7(L)  8.6(L) 8.3(L)  Total Protein 6.5 -  8.1 g/dL - 5.9(L) 5.7(L)  Total Bilirubin 0.3 - 1.2 mg/dL - 1.4(H) 1.1  Alkaline Phos 38 - 126 U/L - 86 66  AST 15 - 41 U/L - 34 22  ALT 0 - 44 U/L - 33 20      Microbiology: Recent Results (from the past 240 hour(s))  Blood culture (single)     Status: Abnormal   Collection Time: 07/05/19  2:52 PM   Specimen: BLOOD  Result Value Ref Range Status   Specimen Description   Final    BLOOD RAC Performed at [facility], [address], [place], [place] [address]    Special Requests   Final    BOTTLES DRAWN AEROBIC AND ANAEROBIC Blood Culture results may not be optimal due to an excessive volume of blood received in culture bottles Performed at [facility], [address], [place], [place] [address]    Culture  Setup Time   Final    GRAM NEGATIVE RODS AEROBIC BOTTLE ONLY CRITICAL RESULT CALLED TO, READ BACK BY AND VERIFIED WITH: JASON ROBBINS ON 07/06/19 AT 2054 AKT Performed at [facility], [place] [address] [place], [place] [address]    Culture PSEUDOMONAS AERUGINOSA (A)  Final   Report Status 07/09/2019 FINAL  Final   Organism ID, Bacteria PSEUDOMONAS AERUGINOSA  Final      Susceptibility   Pseudomonas aeruginosa - MIC*    CEFTAZIDIME <=1 SENSITIVE Sensitive     CIPROFLOXACIN <=0.25 SENSITIVE Sensitive     GENTAMICIN <=1 SENSITIVE Sensitive     IMIPENEM 2 SENSITIVE Sensitive     PIP/TAZO <=4 SENSITIVE Sensitive     CEFEPIME <=1 SENSITIVE Sensitive     * PSEUDOMONAS AERUGINOSA  Blood Culture ID Panel (Reflexed)     Status: Abnormal   Collection Time: 07/05/19  2:52 PM  Result Value Ref Range Status   Enterococcus species NOT DETECTED NOT DETECTED Final   Listeria monocytogenes NOT DETECTED NOT DETECTED Final   Staphylococcus species NOT DETECTED NOT DETECTED Final   Staphylococcus aureus (BCID) NOT DETECTED NOT DETECTED Final   Streptococcus species NOT DETECTED NOT DETECTED Final   Streptococcus agalactiae NOT DETECTED NOT DETECTED Final    Streptococcus pneumoniae NOT DETECTED NOT DETECTED Final   Streptococcus pyogenes NOT DETECTED NOT DETECTED Final   Acinetobacter baumannii NOT DETECTED NOT DETECTED Final   Enterobacteriaceae species NOT DETECTED NOT DETECTED Final   Enterobacter cloacae complex NOT DETECTED NOT DETECTED Final   Escherichia coli NOT DETECTED NOT DETECTED Final   Klebsiella oxytoca NOT DETECTED NOT DETECTED Final   Klebsiella pneumoniae NOT DETECTED NOT DETECTED Final   Proteus species NOT DETECTED NOT DETECTED Final   Serratia marcescens NOT DETECTED NOT DETECTED Final   Carbapenem resistance NOT DETECTED NOT DETECTED Final   Haemophilus influenzae NOT DETECTED NOT DETECTED Final   Neisseria meningitidis NOT DETECTED NOT DETECTED Final   Pseudomonas aeruginosa DETECTED (A) NOT DETECTED Final    Comment: CRITICAL RESULT CALLED TO, READ BACK BY AND VERIFIED WITH: JASON ROBBINS ON 07/06/19 AT 2054 AKT    Candida albicans NOT DETECTED NOT DETECTED Final   Candida glabrata NOT DETECTED NOT DETECTED Final   Candida krusei NOT DETECTED NOT DETECTED Final   Candida parapsilosis NOT DETECTED NOT DETECTED Final   Candida tropicalis NOT DETECTED NOT DETECTED Final    Comment: Performed at [facility], [address] [place], [place] [address]  Blood culture (single)  Status: None   Collection Time: 07/05/19  5:20 PM   Specimen: BLOOD  Result Value Ref Range Status   Specimen Description BLOOD BLOOD RIGHT WRIST  Final   Special Requests   Final    BOTTLES DRAWN AEROBIC AND ANAEROBIC Blood Culture results may not be optimal due to an excessive volume of blood received in culture bottles   Culture   Final    NO GROWTH 5 DAYS Performed at [facility], [address] [place], [place] [address]    Report Status 07/10/2019 FINAL  Final  SARS Coronavirus 2 by RT PCR (hospital order, performed in [facility] hospital lab) Nasopharyngeal Nasopharyngeal Swab     Status: None   Collection  Time: 07/05/19  6:31 PM   Specimen: Nasopharyngeal Swab  Result Value Ref Range Status   SARS Coronavirus 2 NEGATIVE NEGATIVE Final    Comment: (NOTE) SARS-CoV-2 target nucleic acids are NOT DETECTED. The SARS-CoV-2 RNA is generally detectable in upper and lower respiratory specimens during the acute phase of infection. The lowest concentration of SARS-CoV-2 viral copies this assay can detect is 250 copies / mL. A negative result does not preclude SARS-CoV-2 infection and should not be used as the sole basis for treatment or other patient management decisions.  A negative result may occur with improper specimen collection / handling, submission of specimen other than nasopharyngeal swab, presence of viral mutation(s) within the areas targeted by this assay, and inadequate number of viral copies (<250 copies / mL). A negative result must be combined with clinical observations, patient history, and epidemiological information. Fact Sheet for Patients:   StrictlyIdeas.no Fact Sheet for Healthcare Providers: BankingDealers.co.za This test is not yet approved or cleared  by the [place] FDA and has been authorized for detection and/or diagnosis of SARS-CoV-2 by FDA under an Emergency Use Authorization (EUA).  This EUA will remain in effect (meaning this test can be used) for the duration of the COVID-19 declaration under Section 564(b)(1) of the Act, 21 U.S.C. section 360bbb-3(b)(1), unless the authorization is terminated or revoked sooner. Performed at [facility], [address] [place], [place] [address]   Blood culture (routine x 2)     Status: None   Collection Time: 07/06/19 11:41 PM   Specimen: Right Antecubital; Blood  Result Value Ref Range Status   Specimen Description RIGHT ANTECUBITAL  Final   Special Requests   Final    BOTTLES DRAWN AEROBIC AND ANAEROBIC Blood Culture adequate volume   Culture   Final    NO  GROWTH 5 DAYS Performed at [facility], [address], [place], [place] [address]    Report Status 07/12/2019 FINAL  Final  Blood culture (routine x 2)     Status: None   Collection Time: 07/06/19 11:46 PM   Specimen: BLOOD LEFT FOREARM  Result Value Ref Range Status   Specimen Description BLOOD LEFT FOREARM  Final   Special Requests   Final    BOTTLES DRAWN AEROBIC AND ANAEROBIC Blood Culture adequate volume   Culture   Final    NO GROWTH 5 DAYS Performed at [facility], [address], [place], [place] [address]    Report Status 07/12/2019 FINAL  Final  SARS CORONAVIRUS 2 (TAT 6-24 HRS) Nasopharyngeal Nasopharyngeal Swab     Status: None   Collection Time: 07/13/19  9:54 AM   Specimen: Nasopharyngeal Swab  Result Value Ref Range Status   SARS Coronavirus 2 NEGATIVE NEGATIVE Final  Comment: (NOTE) SARS-CoV-2 target nucleic acids are NOT DETECTED. The SARS-CoV-2 RNA is generally detectable in upper and lower respiratory specimens during the acute phase of infection. Negative results do not preclude SARS-CoV-2 infection, do not rule out co-infections with other pathogens, and should not be used as the sole basis for treatment or other patient management decisions. Negative results must be combined with clinical observations, patient history, and epidemiological information. The expected result is Negative. Fact Sheet for Patients: SugarRoll.be Fact Sheet for Healthcare Providers: https://www.woods-mathews.com/ This test is not yet approved or cleared by the [place] FDA and  has been authorized for detection and/or diagnosis of SARS-CoV-2 by FDA under an Emergency Use Authorization (EUA). This EUA will remain  in effect (meaning this test can be used) for the duration of the COVID-19 declaration under Section 56 4(b)(1) of the Act, 21 U.S.C. section 360bbb-3(b)(1), unless the authorization is terminated or  revoked sooner. Performed at [facility], [place] [address], [place], [place] [address]     IMAGING RESULTS:  I have personally reviewed the films ? Impression/Recommendation ? ?Pseudomonas bacteremia - 1/4 today  Unclear source.  He has pacemaker and hence is getting his TEE today to look at the pacemaker lead and the valves to make sure there is no vegetation.   on my discussion with Dr. Sabra Heck he has a history of COPD and past history of bronchiectasis it could be a source even though patient was totally asymptomatic and CT chest did not show any evidence of active bronchiectasis.  As patient has an abdominal aorta aneurysmal dilatation of 3.5 cm oral ciprofloxacin was not used. Patient currently on cefepime and his last date would be 07/21/2019 provided TEE does not show any vegetation.  Paroxysmal A. Fib on Eliquis. Sinoatrial node dysfunction has a pacemaker.  CAD status post CABG  BPH on tamsulosin.  Asymptomatic colonic diverticulosis ? ___________________________________________________ Discussed with patient, and Dr. Sabra Heck Will discuss with the patient after TEE results.  Note:  This document was prepared using Dragon voice recognition software and may include unintentional dictation errors.

## 2019-07-14 NOTE — Patient Instructions (Signed)
You are here for follow up after hospital discharge for pseudomonas bacteremia-and you are getting cefepime 2 grams IV q 12 today you will have a TEE to determine whether your valves or pacemaker lead has no vegetation. If TEE normal you will complete antibiotic on 07/21/19. If TEE abnormal then your antibiotic will be extended- will discuss after you have TEE

## 2019-07-14 NOTE — Progress Notes (Signed)
*  PRELIMINARY RESULTS* Echocardiogram Echocardiogram Transesophageal has been performed.  Sherrie Sport 07/14/2019, 12:40 PM

## 2019-07-14 NOTE — H&P (Addendum)
Chief Complaint: Chief Complaint  Patient presents with  . Follow-up  [facility]  Date of Service: 07/13/2019 Date of Birth: March 20, 1935 PCP: Yevonne Pax, MD  History of Present Illness: [name] is a 84 y.o.male patient who returns for follow-up visit. He was recently hospitalized with bacteremia. Transthoracic echo showed no significant vegetations. Requested transesophageal echo per infectious disease. Patient had pseudomonal bacteremia. Currently getting IV antibiotics. TEE requested to further evaluate to allow adequate determination of time of IV antibiotics. He has a history of coronary artery disease, hyperlipidemia and hypertension. He is status post Coronary artery bypass grafting x3 vessels in 1987. Patient had an echocardiogram done in January 2018 which was read as showing normal LV function with no regional wall motion abnormality. There was no significant valvular abnormalities other than mild MR or AI and TR. He did an ETT where there were a lot of PVCs and PACs. . Pacemaker is functioning normally. Past Medical and Surgical History  Past Medical History Past Medical History:  Diagnosis Date  . Acquired hypothyroidism, unspecified  . Atrial fibrillation (CMS-HCC) 11/26/2016  . Bacteremia due to Pseudomonas 07/13/2019  5/21, unclear source, chest/abdominal CT negative, echo normal, IV antibiotics  . BPH (benign prostatic hyperplasia)  . Chronic obstructive asthma, unspecified (CMS-HCC)  . Chronic obstructive pulmonary disease (CMS-HCC)  . Complex renal cyst 07/13/2019  . 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 Cardiac cath 08/22/03.  . Coronary atherosclerosis of native coronary artery  . Essential hypertension, benign  . GERD (gastroesophageal reflux disease)  . OSA on CPAP 09/06/2018  . Other and unspecified hyperlipidemia  . Sleep apnea  on CPAP   Past Surgical History He has a past surgical history that includes Cardiac  catheterization ( 08/22/03); Appendectomy; other surgery (1999); Coronary artery bypass graft (1987); Insert / replace / remove pacemaker; Coronary angioplasty; egd (01/25/2003); Colonoscopy (01/25/2003); Colonoscopy (12/09/2007); Colonoscopy (02/02/2012); Colonoscopy (05/05/2017); Appendectomy; and Hernia repair.   Medications and Allergies  Current Medications  Current Outpatient Medications  Medication Sig Dispense Refill  . amLODIPine (NORVASC) 10 MG tablet TAKE 1 TABLET BY MOUTH EVERY DAY 90 tablet 1  . atorvastatin (LIPITOR) 20 MG tablet Take 1 tablet (20 mg total) by mouth once daily 90 tablet 3  . budesonide (PULMICORT FLEXHALER) 180 mcg/actuation inhaler Inhale 1 inhalation into the lungs once daily 1 each 5  . ELIQUIS 5 mg tablet TAKE 1 TABLET BY MOUTH TWICE DAILY 60 tablet 5  . fluticasone propionate (FLONASE) 50 mcg/actuation nasal spray Place 2 sprays into both nostrils once daily as needed for Rhinitis 16 g 11  . isosorbide mononitrate (IMDUR) 30 MG ER tablet TAKE 1 TABLET BY MOUTH EVERY DAY 90 tablet 2  . ketoconazole (NIZORAL) 2 % cream APPLY TO THE FEET AND BETWEEN TOES EVERY NIGHT AT BEDTIME  . montelukast (SINGULAIR) 10 mg tablet TAKE 1 TABLET(10 MG) BY MOUTH EVERY DAY 90 tablet 1  . multivitamin tablet Take 1 tablet by mouth once daily.  . nitroGLYcerin (NITROSTAT) 0.4 MG SL tablet 1 tablet under tongue as needed [1 tab under tongue every 5 mins for chest pain, if 3rd tab needed take then call 911.] 25 tablet 2  . omeprazole (PRILOSEC) 20 MG DR capsule Take 1 capsule (20 mg total) by mouth once daily 90 capsule 3  . sodium chloride 0.9% SolP 100 mL with ceFEPIme 2 gram SolR 1 g IVPB Inject 2 g into the vein 2 (two) times daily  . tamsulosin (FLOMAX)  0.4 mg capsule Take 0.4 mg by mouth once daily.    No current facility-administered medications for this visit.   Allergies: Procardia [nifedipine]  Social and Family History  Social History reports that he has never smoked.  He has never used smokeless tobacco. He reports that he does not drink alcohol and does not use drugs.  Family History Family History  Problem Relation Age of Onset  . Throat cancer Father  deceased age 19, throat cancer  . Colon cancer Father  . Heart disease Mother  . Coronary Artery Disease (Blocked arteries around heart) Mother  . Diabetes type II Mother   Review of Systems  Review of Systems  Constitutional: Negative for chills, diaphoresis, fever, malaise/fatigue and weight loss.  HENT: Negative for congestion, ear discharge, hearing loss and tinnitus.  Eyes: Negative for blurred vision.  Respiratory: Negative for cough, hemoptysis, sputum production, shortness of breath and wheezing.  Cardiovascular: Negative for chest pain, palpitations, orthopnea, claudication, leg swelling and PND.  Gastrointestinal: Negative for abdominal pain, blood in stool, constipation, diarrhea, heartburn, melena, nausea and vomiting.  Genitourinary: Negative for dysuria, frequency, hematuria and urgency.  Musculoskeletal: Negative for back pain, falls, joint pain and myalgias.  Skin: Negative for itching and rash.  Neurological: Negative for dizziness, tingling, focal weakness, loss of consciousness, weakness and headaches.  Endo/Heme/Allergies: Negative for polydipsia. Does not bruise/bleed easily.  Psychiatric/Behavioral: Negative for depression, memory loss and substance abuse. The patient is not nervous/anxious.    Physical Examination   Vitals: BP 138/78  Pulse 63  Resp 16  Ht 175.3 cm (5\' 9" )  Wt 80.3 kg (177 lb)  SpO2 98%  BMI 26.14 kg/m  Ht:175.3 cm (5\' 9" ) Wt:80.3 kg (177 lb) [id number] surface area is 1.98 meters squared. Body mass index is 26.14 kg/m.  Wt Readings from Last 3 Encounters:  07/13/19 80.3 kg (177 lb)  07/13/19 80.4 kg (177 lb 3.2 oz)  05/31/19 82.1 kg (181 lb)   BP Readings from Last 3 Encounters:  07/13/19 138/78  07/13/19 138/78  05/31/19 130/80   General:  Caucasian male in no acute distress LUNGS Breath Sounds: Normal Percussion: Normal  CARDIOVASCULAR JVP CV wave: no HJR: no Elevation at 90 degrees: None Carotid Pulse: normal pulsation bilaterally Bruit: None Apex: apical impulse normal  Auscultation Rhythm: atrial fibrillation and normal pacemaker rhythm S1: normal S2: normal Clicks: no Rub: no Murmurs: no murmurs  Gallop: None ABDOMEN Liver enlargement: no Pulsatile aorta: no Ascites: no Bruits: no  EXTREMITIES Clubbing: no Edema: trace to 1+ bilateral pedal edema Pulses: peripheral pulses symmetrical Femoral Bruits: no Amputation: no SKIN Rash: no Cyanosis: no Embolic phemonenon: no Bruising: no NEURO Alert and Oriented to person, place and time: yes Non focal: yes    Assessment and Plan   84 y.o. male with  ICD-10-CM ICD-9-CM  1. 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 Remains on atorvastatin and long-acting nitrates. Will continue with Eliquis for his atrial fibrillation. Will follow for further episodes of ischemia or excessive bleeding or bruising. I25.718 414.02  413.9  2. Atherosclerosis of native coronary artery of native heart without angina pectoris-currently stable I25.10 414.01  3. Essential hypertension, benign-blood pressure is currently controlled with amlodipine and nitrates. Will continue with this and dash diet and follow. I10 401.1  4. Sleep apnea with use of continuous positive airway pressure (CPAP)-continue with CPAP in weight loss. G47.33 327.23  5. Bradycardia-ventricular pacing at present. Will follow for  any tachyarrhythmias . TEE to evaluate for evidence of lead infection.  6. Pseudomonas bacteremia-TEE requested to evaluate for evidence of endocarditis.  No follow-ups on file.  These notes generated with voice recognition software. I apologize for typographical errors.  Sydnee Levans, MD   Pt seen and examined. No change from above.

## 2019-07-14 NOTE — CV Procedure (Signed)
   TRANSESOPHAGEAL ECHOCARDIOGRAM   NAME:  Joshua Frye   MRN: [id number] DOB:  September 19, 1935   ADMIT DATE: 07/14/2019  INDICATIONS:   PROCEDURE:   Informed consent was obtained prior to the procedure. The risks, benefits and alternatives for the procedure were discussed and the patient comprehended these risks.  Risks include, but are not limited to, cough, sore throat, vomiting, nausea, somnolence, esophageal and stomach trauma or perforation, bleeding, low blood pressure, aspiration, pneumonia, infection, trauma to the teeth and death.    After a procedural time-out, the patient was given 3 mg versed and 75 mcg fentanyl to achieve moderate sedation for 5 min.  The oropharynx was anesthetized 3 cc of topical 1% viscous lidocaine.  The transesophageal probe was inserted in the esophagus and stomach without difficulty and multiple views were obtained. I was present for the entire procedure.    COMPLICATIONS:    There were no immediate complications.  FINDINGS:  LEFT VENTRICLE: EF = 55%. No regional wall motion abnormalities.  RIGHT VENTRICLE: Normal size and function.   LEFT ATRIUM: Normal. No thrombi  LEFT ATRIAL APPENDAGE: No thrombus.   RIGHT ATRIUM: Mildly dilated. No thrombus  AORTIC VALVE:  Trileaflet. No vegetations or thrombi. Trace ai  MITRAL VALVE:    Normal. Normal no vegetations. Trivial to mild mr.   TRICUSPID VALVE: Normal. No vegetations. Mild tr  PULMONIC VALVE: Grossly normal.  INTERATRIAL SEPTUM: No PFO or ASD. Agitated saline contrast was used.   PERICARDIUM: No effusion  DESCENDING AORTA: Normal CONCLUSION: No evidecne of vegetations or valves or pacer lead

## 2019-08-05 ENCOUNTER — Ambulatory Visit: Payer: Medicare Other | Admitting: Urology

## 2019-09-26 ENCOUNTER — Other Ambulatory Visit: Payer: Self-pay | Admitting: Urology

## 2019-09-26 DIAGNOSIS — N401 Enlarged prostate with lower urinary tract symptoms: Secondary | ICD-10-CM

## 2019-09-26 DIAGNOSIS — Z87898 Personal history of other specified conditions: Secondary | ICD-10-CM

## 2019-10-19 ENCOUNTER — Ambulatory Visit: Payer: Medicare Other | Admitting: Urology

## 2019-10-21 ENCOUNTER — Ambulatory Visit (INDEPENDENT_AMBULATORY_CARE_PROVIDER_SITE_OTHER): Payer: Medicare Other | Admitting: Urology

## 2019-10-21 ENCOUNTER — Encounter: Payer: Self-pay | Admitting: Urology

## 2019-10-21 ENCOUNTER — Other Ambulatory Visit: Payer: Self-pay

## 2019-10-21 VITALS — BP 152/84 | HR 75 | Ht 69.0 in | Wt 175.0 lb

## 2019-10-21 DIAGNOSIS — N401 Enlarged prostate with lower urinary tract symptoms: Secondary | ICD-10-CM | POA: Diagnosis not present

## 2019-10-21 DIAGNOSIS — Z87898 Personal history of other specified conditions: Secondary | ICD-10-CM

## 2019-10-21 DIAGNOSIS — R351 Nocturia: Secondary | ICD-10-CM

## 2019-10-21 LAB — BLADDER SCAN AMB NON-IMAGING: Scan Result: 14

## 2019-10-21 MED ORDER — TROSPIUM CHLORIDE 20 MG PO TABS: ORAL_TABLET | ORAL | 0 refills | Status: DC

## 2019-10-21 MED ORDER — TAMSULOSIN HCL 0.4 MG PO CAPS: ORAL_CAPSULE | ORAL | 3 refills | Status: DC

## 2019-10-21 NOTE — Progress Notes (Signed)
10/21/2019 1:26 PM   Joshua Frye [birth year] [id number]  Referring provider: [name], MD [place] [facility] [place],  [place] [address]  Chief Complaint  Patient presents with  . Benign Prostatic Hypertrophy    Urologic history: 1.  BPH with lower urinary tract symptoms             -Postop urinary retention after herniorrhaphy 2017             -Tamsulosin 0.8 mg daily  HPI: 84 y.o. male presents for annual follow-up.   Since last years visit has noted worsening nocturia at 3-4  Small-volume voids  History snoring  Remains on tamsulosin 0.8 mg daily  Denies dysuria, gross hematuria  Denies flank, abdominal or pelvic pain  PCP still checking PSA and was 0.91 06/2019   PMH: Past Medical History:  Diagnosis Date  . Anxiety   . Asthma   . BPH (benign prostatic hypertrophy) 2017  . Bronchiectasis ([place])   . COPD (chronic obstructive pulmonary disease) ([place])   . Coronary artery disease   . Dysrhythmia    asymptomatic pvcs  . GERD (gastroesophageal reflux disease)   . History of gout    several years ago  . HOH (hard of hearing)    Bilateral Hearing Aids  . Hyperlipidemia   . Hypertension   . Hypothyroidism   . Myocardial infarction ([place]) 1985  . Presence of permanent cardiac pacemaker   . Sleep apnea    OSA--C-PAP  . 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 Squamous cell carcinoma of arm, left 09/10/2017   L mid dorsum forearm   . Squamous cell carcinoma of arm, right 06/01/2014   R prox forearm   . Squamous cell carcinoma of hand, right 04/06/2018   R hand thumb webspace   . Squamous cell carcinoma of leg, left 05/24/2018   L mid lat pretibial   . Squamous cell carcinoma of leg, left 05/24/2018   L mid ant med thigh   . Squamous cell carcinoma of leg, left 05/24/2018   L med mid calf  . Squamous cell carcinoma of leg, right 01/16/2015   R lat calf  . Squamous cell carcinoma of skin  01/27/2013   R post forearm  . Squamous cell carcinoma, arm, right 04/27/2013   R prox dorsum forearm     Surgical History: Past Surgical History:  Procedure Laterality Date  . APPENDECTOMY    . COLONOSCOPY WITH PROPOFOL N/A 05/05/2017   Procedure: COLONOSCOPY WITH PROPOFOL;  Surgeon: Manya Silvas, MD;  [facility] ENDOSCOPY;  Service: Endoscopy;  Laterality: N/A;  . CORONARY ANGIOPLASTY     x 2  . CORONARY ARTERY BYPASS GRAFT  1985  . INGUINAL HERNIA REPAIR Right 11/20/2015   Procedure: HERNIA REPAIR INGUINAL ADULT;  Surgeon: [name], MD;  Location: [facility] ORS;  Service: General;  Laterality: Right;  . INSERT / REPLACE / REMOVE PACEMAKER    . PACEMAKER INSERTION Left 10/15/2016   Procedure: INSERTION PACEMAKER;  Surgeon: Isaias Cowman, MD;  Location: [facility] ORS;  Service: Cardiovascular;  Laterality: Left;  . PACEMAKER LEAD REMOVAL N/A 11/19/2016   Procedure: LEAD REVISION;  Surgeon: Isaias Cowman, MD;  Location: [facility] ORS;  Service: Cardiovascular;  Laterality: N/A;  . TEE WITHOUT CARDIOVERSION N/A 07/14/2019   Procedure: TRANSESOPHAGEAL ECHOCARDIOGRAM (TEE);  Surgeon: Teodoro Spray, MD;  Location: [facility] ORS;  Service: Cardiovascular;  Laterality: N/A;  Home Medications:  Allergies as of 10/21/2019      Reactions   Procardia [nifedipine] Other (See Comments)   Other reaction(s): Unknown dizzy Gets very woozy with this medication      Medication List       Accurate as of October 21, 2019  1:26 PM. If you have any questions, ask your nurse or doctor.        amLODipine 10 MG tablet Commonly known as: NORVASC Take 10 mg by mouth daily.   apixaban 5 MG Tabs tablet Commonly known as: ELIQUIS Take 5 mg by mouth 2 (two) times daily.   atorvastatin 20 MG tablet Commonly known as: LIPITOR TAKE ONE (1) TABLET BY MOUTH EVERY DAY   budesonide 180 MCG/ACT inhaler Commonly known as: PULMICORT Inhale 1 puff into the lungs daily.   fluticasone 50  MCG/ACT nasal spray Commonly known as: FLONASE instill 2 sprays into each nostril once daily   ibuprofen 200 MG tablet Commonly known as: ADVIL Take 400 mg by mouth every 8 (eight) hours as needed for headache or mild pain.   isosorbide mononitrate 30 MG 24 hr tablet Commonly known as: IMDUR Take 30 mg by mouth daily.   ketoconazole 2 % cream Commonly known as: NIZORAL Apply to the feet and between toes QHS   montelukast 10 MG tablet Commonly known as: SINGULAIR Take 10 mg by mouth daily.   multivitamin capsule Take 1 capsule by mouth daily.   nitroGLYCERIN 0.4 MG SL tablet Commonly known as: NITROSTAT Place 0.4 mg under the tongue every 5 (five) minutes as needed for chest pain.   NON FORMULARY   omeprazole 20 MG capsule Commonly known as: PRILOSEC TAKE ONE (1) CAPSULE EACH DAY   tamsulosin 0.4 MG Caps capsule Commonly known as: FLOMAX TAKE 1 CAPSULE(0.4 MG) BY MOUTH TWICE DAILY   vitamin C 500 MG tablet Commonly known as: ASCORBIC ACID Take 500 mg by mouth daily.       Allergies:  Allergies  Allergen Reactions  . Procardia [Nifedipine] Other (See Comments)    Other reaction(s): Unknown dizzy Gets very woozy with this medication    Family History: Family History  Problem Relation Age of Onset  . Diabetes Mother     Social History:  reports that he has never smoked. He has never used smokeless tobacco. He reports that he does not drink alcohol and does not use drugs.   Physical Exam: BP (!) 152/84   Pulse 75   Ht 5\' 9"  (1.753 m)   Wt 175 lb (79.4 kg)   BMI 25.84 kg/m   Constitutional:  Alert and oriented, No acute distress. HEENT: [place] AT, moist mucus membranes.  Trachea midline, no masses. Cardiovascular: No clubbing, cyanosis, or edema. Respiratory: Normal respiratory effort, no increased work of breathing. Skin: No rashes, bruises or suspicious lesions. Neurologic: Grossly intact, no focal deficits, moving all 4 extremities. Psychiatric:  Normal mood and affect.   Assessment & Plan:    1.  Nocturia  Worsening nocturia since last years visit  We discussed potential etiologies including BPH, nocturnal polyuria and sleep apnea  Trial trospium 20 mg at bedtime  If no significant improvement would recommend sleep study  2. Benign prostatic hyperplasia with lower urinary tract symptoms, symptom details unspecified  Stable lower urinary tract symptoms on tamsulosin  Refill sent to pharmacy  Bladder scan PVR 14 mL   continue annual follow-up   Abbie Sons, MD  [place] [address], [address] [place], [place] [address] [phone]  

## 2019-10-23 ENCOUNTER — Encounter: Payer: Self-pay | Admitting: Urology

## 2019-10-25 ENCOUNTER — Telehealth: Payer: Self-pay | Admitting: *Deleted

## 2019-10-25 NOTE — Telephone Encounter (Signed)
He has a history of urinary retention and would not recommend solifenacin or oxybutynin.  Can give a trial of Myrbetriq 25 mg daily

## 2019-10-25 NOTE — Telephone Encounter (Signed)
Trospium was denied

## 2019-10-25 NOTE — Telephone Encounter (Signed)
Medication up front for patient to pick up

## 2019-12-07 ENCOUNTER — Encounter: Payer: Self-pay | Admitting: Dermatology

## 2019-12-07 ENCOUNTER — Ambulatory Visit (INDEPENDENT_AMBULATORY_CARE_PROVIDER_SITE_OTHER): Payer: Medicare Other | Admitting: Dermatology

## 2019-12-07 ENCOUNTER — Other Ambulatory Visit: Payer: Self-pay

## 2019-12-07 DIAGNOSIS — L578 Other skin changes due to chronic exposure to nonionizing radiation: Secondary | ICD-10-CM

## 2019-12-07 DIAGNOSIS — L820 Inflamed seborrheic keratosis: Secondary | ICD-10-CM

## 2019-12-07 DIAGNOSIS — I8310 Varicose veins of unspecified lower extremity with inflammation: Secondary | ICD-10-CM

## 2019-12-07 DIAGNOSIS — L821 Other seborrheic keratosis: Secondary | ICD-10-CM

## 2019-12-07 DIAGNOSIS — Z85828 Personal history of other malignant neoplasm of skin: Secondary | ICD-10-CM

## 2019-12-07 DIAGNOSIS — L570 Actinic keratosis: Secondary | ICD-10-CM

## 2019-12-07 NOTE — Progress Notes (Signed)
Follow-Up Visit   Subjective  Joshua Frye is a 84 y.o. male who presents for the following: Actinic Keratosis (recheck sun exposed areas for new or persistent skin lesions) and irregular irritated lesions (L arm, B/L post auricular, and back - patient is concerned and would like them checked).  The following portions of the chart were reviewed this encounter and updated as appropriate:  Tobacco  Allergies  Meds  Problems  Med Hx  Surg Hx  Fam Hx     Review of Systems:  No other skin or systemic complaints except as noted in HPI or Assessment and Plan.  Objective  Well appearing patient in no apparent distress; mood and affect are within normal limits.  A focused examination was performed including the face, trunk, and extremities. Relevant physical exam findings are noted in the Assessment and Plan.  Objective  post auricular x 2, B/L arm x 2, low back x 1 (5): Erythematous keratotic or waxy stuck-on papule or plaque.   Objective  Face and ears x 19 (19): Erythematous thin papules/macules with gritty scale.   Assessment & Plan  Inflamed seborrheic keratosis (5) post auricular x 2, B/L arm x 2, low back x 1  Destruction of lesion - post auricular x 2, B/L arm x 2, low back x 1 Complexity: simple   Destruction method: cryotherapy   Informed consent: discussed and consent obtained   Timeout:  patient name, date of birth, surgical site, and procedure verified Lesion destroyed using liquid nitrogen: Yes   Region frozen until ice ball extended beyond lesion: Yes   Outcome: patient tolerated procedure well with no complications   Post-procedure details: wound care instructions given    AK (actinic keratosis) (19) Face and ears x 19  Destruction of lesion - Face and ears x 19 Complexity: simple   Destruction method: cryotherapy   Informed consent: discussed and consent obtained   Timeout:  patient name, date of birth, surgical site, and procedure verified Lesion  destroyed using liquid nitrogen: Yes   Region frozen until ice ball extended beyond lesion: Yes   Outcome: patient tolerated procedure well with no complications   Post-procedure details: wound care instructions given     Seborrheic Keratoses - Stuck-on, waxy, tan-brown papules and plaques  - Discussed benign etiology and prognosis. - Observe - Call for any changes  Actinic Damage - diffuse scaly erythematous macules with underlying dyspigmentation - Recommend daily broad spectrum sunscreen SPF 30+ to sun-exposed areas, reapply every 2 hours as needed.  - Call for new or changing lesions.  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  History of Squamous Cell Carcinoma of the Skin - No evidence of recurrence today - No lymphadenopathy - Recommend regular full body skin exams - Recommend daily broad spectrum sunscreen SPF 30+ to sun-exposed areas, reapply every 2 hours as needed.  - Call if any new or changing lesions are noted between office visits  Return in about 6 months (around 06/06/2020) for TBSE - hx of SCC's and AK's .  Luther Redo, CMA, am acting as scribe for Sarina Ser, MD .  Documentation: I have reviewed the above documentation for accuracy and  completeness, and I agree with the above.  Sarina Ser, MD

## 2019-12-08 ENCOUNTER — Encounter: Payer: Self-pay | Admitting: Dermatology

## 2020-01-09 ENCOUNTER — Other Ambulatory Visit: Payer: Self-pay

## 2020-01-09 ENCOUNTER — Ambulatory Visit (INDEPENDENT_AMBULATORY_CARE_PROVIDER_SITE_OTHER): Payer: Medicare Other | Admitting: Dermatology

## 2020-01-09 DIAGNOSIS — D485 Neoplasm of uncertain behavior of skin: Secondary | ICD-10-CM

## 2020-01-09 DIAGNOSIS — C44722 Squamous cell carcinoma of skin of right lower limb, including hip: Secondary | ICD-10-CM | POA: Diagnosis not present

## 2020-01-09 DIAGNOSIS — L578 Other skin changes due to chronic exposure to nonionizing radiation: Secondary | ICD-10-CM | POA: Diagnosis not present

## 2020-01-09 NOTE — Patient Instructions (Signed)

## 2020-01-09 NOTE — Progress Notes (Signed)
   Follow-Up Visit   Subjective  Joshua Frye is a 84 y.o. male who presents for the following: Other (Spot on right lower leg x 2 weeks that just popped up.).  The following portions of the chart were reviewed this encounter and updated as appropriate:  Tobacco  Allergies  Meds  Problems  Med Hx  Surg Hx  Fam Hx     Review of Systems:  No other skin or systemic complaints except as noted in HPI or Assessment and Plan.  Objective  Well appearing patient in no apparent distress; mood and affect are within normal limits.  A focused examination was performed including right lower leg. Relevant physical exam findings are noted in the Assessment and Plan.  Objective  Right Lower Leg - Anterior: 1.2 cm hyperkeratotic papule   Assessment & Plan    Actinic Damage - chronic, secondary to cumulative UV radiation exposure/sun exposure over time - diffuse scaly erythematous macules with underlying dyspigmentation - Recommend daily broad spectrum sunscreen SPF 30+ to sun-exposed areas, reapply every 2 hours as needed.  - Call for new or changing lesions.  Neoplasm of uncertain behavior of skin Right Lower Leg - Anterior  Epidermal / dermal shaving  Lesion diameter (cm):  1.2 Informed consent: discussed and consent obtained   Timeout: patient name, date of birth, surgical site, and procedure verified   Procedure prep:  Patient was prepped and draped in usual sterile fashion Prep type:  Isopropyl alcohol Anesthesia: the lesion was anesthetized in a standard fashion   Anesthetic:  1% lidocaine w/ epinephrine 1-100,000 buffered w/ 8.4% NaHCO3 Instrument used: flexible razor blade   Hemostasis achieved with: pressure, aluminum chloride and electrodesiccation   Outcome: patient tolerated procedure well   Post-procedure details: sterile dressing applied and wound care instructions given   Dressing type: bandage and petrolatum    Destruction of lesion Complexity: extensive     Destruction method: electrodesiccation and curettage   Informed consent: discussed and consent obtained   Timeout:  patient name, date of birth, surgical site, and procedure verified Procedure prep:  Patient was prepped and draped in usual sterile fashion Prep type:  Isopropyl alcohol Anesthesia: the lesion was anesthetized in a standard fashion   Anesthetic:  1% lidocaine w/ epinephrine 1-100,000 buffered w/ 8.4% NaHCO3 Curettage performed in three different directions: Yes   Electrodesiccation performed over the curetted area: Yes   Lesion length (cm):  1.2 Lesion width (cm):  1.2 Margin per side (cm):  0.2 Final wound size (cm):  1.6 Hemostasis achieved with:  pressure and aluminum chloride Outcome: patient tolerated procedure well with no complications   Post-procedure details: sterile dressing applied and wound care instructions given   Dressing type: bandage and petrolatum    Specimen 1 - Surgical pathology Differential Diagnosis: SCC vs other  Check Margins: No 1.2 cm hyperkeratotic papule EDC today  Return for Follow up as scheduled, TBSE.  I, Ashok Cordia, CMA, am acting as scribe for Sarina Ser, MD .  Documentation: I have reviewed the above documentation for accuracy and completeness, and I agree with the above.  Sarina Ser, MD

## 2020-01-10 ENCOUNTER — Encounter: Payer: Self-pay | Admitting: Dermatology

## 2020-01-11 ENCOUNTER — Telehealth: Payer: Self-pay

## 2020-01-11 NOTE — Telephone Encounter (Signed)
LM on VM to return my call. 

## 2020-01-11 NOTE — Telephone Encounter (Signed)
-----   Message from Ralene Bathe, MD sent at 01/10/2020  6:25 PM EST ----- Diagnosis Skin , right lower leg - anterior SQUAMOUS CELL CARCINOMA, KERATOACANTHOMA TYPE  Cancer-SCC Already treated Recheck on follow-up

## 2020-01-17 ENCOUNTER — Telehealth: Payer: Self-pay

## 2020-01-17 NOTE — Telephone Encounter (Signed)
-----   Message from Ralene Bathe, MD sent at 01/10/2020  6:25 PM EST ----- Diagnosis Skin , right lower leg - anterior SQUAMOUS CELL CARCINOMA, KERATOACANTHOMA TYPE  Cancer-SCC Already treated Recheck on follow-up

## 2020-01-17 NOTE — Telephone Encounter (Signed)
Advised pt of bx results/sh ?

## 2020-06-18 ENCOUNTER — Ambulatory Visit (INDEPENDENT_AMBULATORY_CARE_PROVIDER_SITE_OTHER): Payer: Medicare Other | Admitting: Dermatology

## 2020-06-18 ENCOUNTER — Encounter: Payer: Self-pay | Admitting: Dermatology

## 2020-06-18 ENCOUNTER — Other Ambulatory Visit: Payer: Self-pay

## 2020-06-18 DIAGNOSIS — L570 Actinic keratosis: Secondary | ICD-10-CM | POA: Diagnosis not present

## 2020-06-18 DIAGNOSIS — Z85828 Personal history of other malignant neoplasm of skin: Secondary | ICD-10-CM

## 2020-06-18 DIAGNOSIS — I999 Unspecified disorder of circulatory system: Secondary | ICD-10-CM | POA: Diagnosis not present

## 2020-06-18 DIAGNOSIS — D1800 Hemangioma unspecified site: Secondary | ICD-10-CM

## 2020-06-18 DIAGNOSIS — B353 Tinea pedis: Secondary | ICD-10-CM

## 2020-06-18 DIAGNOSIS — I8310 Varicose veins of unspecified lower extremity with inflammation: Secondary | ICD-10-CM | POA: Diagnosis not present

## 2020-06-18 DIAGNOSIS — Z1283 Encounter for screening for malignant neoplasm of skin: Secondary | ICD-10-CM

## 2020-06-18 DIAGNOSIS — L820 Inflamed seborrheic keratosis: Secondary | ICD-10-CM

## 2020-06-18 DIAGNOSIS — L578 Other skin changes due to chronic exposure to nonionizing radiation: Secondary | ICD-10-CM

## 2020-06-18 DIAGNOSIS — D229 Melanocytic nevi, unspecified: Secondary | ICD-10-CM

## 2020-06-18 DIAGNOSIS — L821 Other seborrheic keratosis: Secondary | ICD-10-CM

## 2020-06-18 DIAGNOSIS — L814 Other melanin hyperpigmentation: Secondary | ICD-10-CM

## 2020-06-18 MED ORDER — KETOCONAZOLE 2 % EX CREA: TOPICAL_CREAM | CUTANEOUS | 6 refills | Status: DC

## 2020-06-18 NOTE — Progress Notes (Signed)
Follow-Up Visit   Subjective  Joshua Frye is a 85 y.o. male who presents for the following: Annual Exam (Hx AK's, numerous SCC's - patient has noticed a lesion on his R ear and his R lower leg that he would like checked today). The patient presents for Total-Body Skin Exam (TBSE) for skin cancer screening and mole check.  The following portions of the chart were reviewed this encounter and updated as appropriate:   Tobacco  Allergies  Meds  Problems  Med Hx  Surg Hx  Fam Hx     Review of Systems:  No other skin or systemic complaints except as noted in HPI or Assessment and Plan.  Objective  Well appearing patient in no apparent distress; mood and affect are within normal limits.  A full examination was performed including scalp, head, eyes, ears, nose, lips, neck, chest, axillae, abdomen, back, buttocks, bilateral upper extremities, bilateral lower extremities, hands, feet, fingers, toes, fingernails, and toenails. All findings within normal limits unless otherwise noted below.  Objective  Right Ear x 1: Erythematous thin papules/macules with gritty scale.   Objective  R lower leg: Dilated blue vein.  Objective  B/L arms and right leg x 17 (17): Erythematous keratotic or waxy stuck-on papule or plaque.   Objective  B/L foot: Scale of the feet and toenail dystrophy of the toenails.  Assessment & Plan  Hypertrophic actinic keratosis Right Ear x 1 Destruction of lesion - Right Ear x 1 Complexity: simple   Destruction method: cryotherapy   Informed consent: discussed and consent obtained   Timeout:  patient name, date of birth, surgical site, and procedure verified Lesion destroyed using liquid nitrogen: Yes   Region frozen until ice ball extended beyond lesion: Yes   Outcome: patient tolerated procedure well with no complications   Post-procedure details: wound care instructions given    Disorder of blood vessel R lower leg Varicose veins with blab of the  lower leg.  Benign nature discussed.  Advised if starts to bleed need to put pressure on it and be evaluated by a physician as it will tend to bleed profusely.  Discussed evaluation and treatment by vascular surgeon.  He declines at this time. Dilated blood vessel - Benign-appearing.  Observation.  Call clinic for new or changing lesions.  Recommend daily use of broad spectrum spf 30+ sunscreen to sun-exposed areas.    Inflamed seborrheic keratosis (17) B/L arms and right leg x 17 Destruction of lesion - B/L arms and right leg x 17 Complexity: simple   Destruction method: cryotherapy   Informed consent: discussed and consent obtained   Timeout:  patient name, date of birth, surgical site, and procedure verified Lesion destroyed using liquid nitrogen: Yes   Region frozen until ice ball extended beyond lesion: Yes   Outcome: patient tolerated procedure well with no complications   Post-procedure details: wound care instructions given    Tinea pedis of both feet B/L foot Chronic and persistent Start Ketoconazole 2% cream to the feet and toenails QHS.  ketoconazole (NIZORAL) 2 % cream - B/L foot   Lentigines - Scattered tan macules - Due to sun exposure - Benign-appering, observe - Recommend daily broad spectrum sunscreen SPF 30+ to sun-exposed areas, reapply every 2 hours as needed. - Call for any changes  Seborrheic Keratoses - Stuck-on, waxy, tan-brown papules and/or plaques  - Benign-appearing - Discussed benign etiology and prognosis. - Observe - Call for any changes  Melanocytic Nevi - Tan-brown and/or pink-flesh-colored symmetric  macules and papules - Benign appearing on exam today - Observation - Call clinic for new or changing moles - Recommend daily use of broad spectrum spf 30+ sunscreen to sun-exposed areas.   Hemangiomas - Red papules - Discussed benign nature - Observe - Call for any changes  Actinic Damage - Chronic condition, secondary to cumulative UV/sun  exposure - diffuse scaly erythematous macules with underlying dyspigmentation - Recommend daily broad spectrum sunscreen SPF 30+ to sun-exposed areas, reapply every 2 hours as needed.  - Staying in the shade or wearing long sleeves, sun glasses (UVA+UVB protection) and wide brim hats (4-inch brim around the entire circumference of the hat) are also recommended for sun protection.  - Call for new or changing lesions.  History of Squamous Cell Carcinoma of the Skin - No evidence of recurrence today - No lymphadenopathy - Recommend regular full body skin exams - Recommend daily broad spectrum sunscreen SPF 30+ to sun-exposed areas, reapply every 2 hours as needed.  - Call if any new or changing lesions are noted between office visits  Varicose Veins - Dilated blue, purple or red veins at the lower extremities - Reassured - These can be treated by sclerotherapy (a procedure to inject a medicine into the veins to make them disappear) if desired, but the treatment is not covered by insurance  Skin cancer screening performed today.  Return in about 1 year (around 06/18/2021) for TBSE.  Luther Redo, CMA, am acting as scribe for Sarina Ser, MD .  Documentation: I have reviewed the above documentation for accuracy and completeness, and I agree with the above.  Sarina Ser, MD

## 2020-06-18 NOTE — Patient Instructions (Signed)

## 2020-06-19 ENCOUNTER — Encounter: Payer: Self-pay | Admitting: Dermatology

## 2020-07-09 ENCOUNTER — Other Ambulatory Visit: Payer: Self-pay

## 2020-07-09 ENCOUNTER — Ambulatory Visit (INDEPENDENT_AMBULATORY_CARE_PROVIDER_SITE_OTHER): Payer: Medicare Other | Admitting: Dermatology

## 2020-07-09 DIAGNOSIS — L570 Actinic keratosis: Secondary | ICD-10-CM | POA: Diagnosis not present

## 2020-07-09 DIAGNOSIS — L820 Inflamed seborrheic keratosis: Secondary | ICD-10-CM

## 2020-07-09 DIAGNOSIS — L578 Other skin changes due to chronic exposure to nonionizing radiation: Secondary | ICD-10-CM | POA: Diagnosis not present

## 2020-07-09 DIAGNOSIS — C44222 Squamous cell carcinoma of skin of right ear and external auricular canal: Secondary | ICD-10-CM

## 2020-07-09 DIAGNOSIS — D485 Neoplasm of uncertain behavior of skin: Secondary | ICD-10-CM

## 2020-07-09 NOTE — Patient Instructions (Signed)

## 2020-07-09 NOTE — Progress Notes (Signed)
Follow-Up Visit   Subjective  Joshua Frye is a 85 y.o. male who presents for the following: Skin lesion (Of the R ear - previously tx with LN2, but lesion has changed in appearance and has not resolved).  He has several areas to be checked.  The following portions of the chart were reviewed this encounter and updated as appropriate:   Tobacco  Allergies  Meds  Problems  Med Hx  Surg Hx  Fam Hx     Review of Systems:  No other skin or systemic complaints except as noted in HPI or Assessment and Plan.  Objective  Well appearing patient in no apparent distress; mood and affect are within normal limits.  A focused examination was performed including the right ear. Relevant physical exam findings are noted in the Assessment and Plan.  Objective  R ear mid helix groove: Hyperkeratotic papule 1.1 cm   Objective  R cheek x 2 (2): Erythematous keratotic or waxy stuck-on papule or plaque.   Objective  Left Ear x 2 (2): Erythematous thin papules/macules with gritty scale.   Assessment & Plan  Neoplasm of uncertain behavior of skin R ear mid helix groove  Skin excision  Lesion length (cm):  1.1 Lesion width (cm):  1.1 Margin per side (cm):  0.2 Total excision diameter (cm):  1.5 Informed consent: discussed and consent obtained   Timeout: patient name, date of birth, surgical site, and procedure verified   Procedure prep:  Patient was prepped and draped in usual sterile fashion Prep type:  Isopropyl alcohol and povidone-iodine Anesthesia: the lesion was anesthetized in a standard fashion   Anesthetic:  1% lidocaine w/ epinephrine 1-100,000 buffered w/ 8.4% NaHCO3 Hemostasis achieved with: pressure   Hemostasis achieved with comment:  Electrocautery Outcome: patient tolerated procedure well with no complications   Post-procedure details: sterile dressing applied and wound care instructions given   Dressing type: bandage and pressure dressing   Additional details:  Simple  excision   Destruction of lesion Complexity: extensive   Destruction method: electrodesiccation and curettage   Informed consent: discussed and consent obtained   Timeout:  patient name, date of birth, surgical site, and procedure verified Procedure prep:  Patient was prepped and draped in usual sterile fashion Prep type:  Isopropyl alcohol Anesthesia: the lesion was anesthetized in a standard fashion   Anesthetic:  1% lidocaine w/ epinephrine 1-100,000 buffered w/ 8.4% NaHCO3 Curettage performed in three different directions: Yes   Electrodesiccation performed over the curetted area: Yes   Lesion length (cm):  1.1 Lesion width (cm):  1.1 Margin per side (cm):  0.2 Final wound size (cm):  1.5 Hemostasis achieved with:  pressure, aluminum chloride and electrodesiccation Outcome: patient tolerated procedure well with no complications   Post-procedure details: sterile dressing applied and wound care instructions given   Dressing type: bandage and petrolatum    Specimen 1 - Surgical pathology Differential Diagnosis: D48.5 r/o SCC  ED&C today  Check Margins: No Hyperkeratotic papule 1.1 cm  Inflamed seborrheic keratosis (2) R cheek x 2  Destruction of lesion - R cheek x 2 Complexity: simple   Destruction method: cryotherapy   Informed consent: discussed and consent obtained   Timeout:  patient name, date of birth, surgical site, and procedure verified Lesion destroyed using liquid nitrogen: Yes   Region frozen until ice ball extended beyond lesion: Yes   Outcome: patient tolerated procedure well with no complications   Post-procedure details: wound care instructions given  AK (actinic keratosis) (2) Left Ear x 2  Destruction of lesion - Left Ear x 2 Complexity: simple   Destruction method: cryotherapy   Informed consent: discussed and consent obtained   Timeout:  patient name, date of birth, surgical site, and procedure verified Lesion destroyed using liquid nitrogen:  Yes   Region frozen until ice ball extended beyond lesion: Yes   Outcome: patient tolerated procedure well with no complications   Post-procedure details: wound care instructions given    Actinic Damage - chronic, secondary to cumulative UV radiation exposure/sun exposure over time - diffuse scaly erythematous macules with underlying dyspigmentation - Recommend daily broad spectrum sunscreen SPF 30+ to sun-exposed areas, reapply every 2 hours as needed.  - Recommend staying in the shade or wearing long sleeves, sun glasses (UVA+UVB protection) and wide brim hats (4-inch brim around the entire circumference of the hat). - Call for new or changing lesions.  Return for appointment as scheduled.  Joshua Frye, CMA, am acting as scribe for Sarina Ser, MD .  Documentation: I have reviewed the above documentation for accuracy and completeness, and I agree with the above.  Sarina Ser, MD

## 2020-07-13 ENCOUNTER — Telehealth: Payer: Self-pay

## 2020-07-13 NOTE — Telephone Encounter (Signed)
-----   Message from Ralene Bathe, MD sent at 07/12/2020 10:14 AM EDT ----- Diagnosis Skin , right ear mid helix groove WELL DIFFERENTIATED SQUAMOUS CELL CARCINOMA, BASE INVOLVED  Cancer - SCC Already treated Recheck next visit

## 2020-07-13 NOTE — Telephone Encounter (Signed)
Patient's wife informed of pathology results.  

## 2020-07-17 ENCOUNTER — Encounter: Payer: Self-pay | Admitting: Dermatology

## 2020-08-15 ENCOUNTER — Other Ambulatory Visit: Payer: Self-pay

## 2020-08-15 ENCOUNTER — Emergency Department: Payer: Medicare Other

## 2020-08-15 ENCOUNTER — Emergency Department
Admission: EM | Admit: 2020-08-15 | Discharge: 2020-08-15 | Disposition: A | Discharge status: Home / Self Care | Payer: Medicare Other | Attending: Emergency Medicine | Admitting: Emergency Medicine

## 2020-08-15 DIAGNOSIS — Z7901 Long term (current) use of anticoagulants: Secondary | ICD-10-CM | POA: Insufficient documentation

## 2020-08-15 DIAGNOSIS — Z951 Presence of aortocoronary bypass graft: Secondary | ICD-10-CM | POA: Insufficient documentation

## 2020-08-15 DIAGNOSIS — I10 Essential (primary) hypertension: Secondary | ICD-10-CM | POA: Diagnosis not present

## 2020-08-15 DIAGNOSIS — I2510 Atherosclerotic heart disease of native coronary artery without angina pectoris: Secondary | ICD-10-CM | POA: Diagnosis not present

## 2020-08-15 DIAGNOSIS — R0789 Other chest pain: Secondary | ICD-10-CM | POA: Diagnosis not present

## 2020-08-15 DIAGNOSIS — Z7951 Long term (current) use of inhaled steroids: Secondary | ICD-10-CM | POA: Insufficient documentation

## 2020-08-15 DIAGNOSIS — R1013 Epigastric pain: Secondary | ICD-10-CM | POA: Insufficient documentation

## 2020-08-15 DIAGNOSIS — E039 Hypothyroidism, unspecified: Secondary | ICD-10-CM | POA: Diagnosis not present

## 2020-08-15 DIAGNOSIS — Z950 Presence of cardiac pacemaker: Secondary | ICD-10-CM | POA: Insufficient documentation

## 2020-08-15 DIAGNOSIS — R079 Chest pain, unspecified: Secondary | ICD-10-CM

## 2020-08-15 DIAGNOSIS — J45909 Unspecified asthma, uncomplicated: Secondary | ICD-10-CM | POA: Diagnosis not present

## 2020-08-15 DIAGNOSIS — J449 Chronic obstructive pulmonary disease, unspecified: Secondary | ICD-10-CM | POA: Insufficient documentation

## 2020-08-15 DIAGNOSIS — Z85828 Personal history of other malignant neoplasm of skin: Secondary | ICD-10-CM | POA: Insufficient documentation

## 2020-08-15 DIAGNOSIS — Z79899 Other long term (current) drug therapy: Secondary | ICD-10-CM | POA: Diagnosis not present

## 2020-08-15 LAB — HEPATIC FUNCTION PANEL
ALT: 19 U/L (ref 0–44)
AST: 23 U/L (ref 15–41)
Albumin: 4.4 g/dL (ref 3.5–5.0)
Alkaline Phosphatase: 82 U/L (ref 38–126)
Bilirubin, Direct: 0.1 mg/dL (ref 0.0–0.2)
Indirect Bilirubin: 0.8 mg/dL (ref 0.3–0.9)
Total Bilirubin: 0.9 mg/dL (ref 0.3–1.2)
Total Protein: 7 g/dL (ref 6.5–8.1)

## 2020-08-15 LAB — TROPONIN I (HIGH SENSITIVITY)
Troponin I (High Sensitivity): 5 ng/L (ref <18)
Troponin I (High Sensitivity): 5 ng/L (ref <18)

## 2020-08-15 LAB — CBC
HCT: 46.9 % (ref 39.0–52.0)
Hemoglobin: 15.9 g/dL (ref 13.0–17.0)
MCH: 30.5 pg (ref 26.0–34.0)
MCHC: 33.9 g/dL (ref 30.0–36.0)
MCV: 90 fL (ref 80.0–100.0)
Platelets: 184 10*3/uL (ref 150–400)
RBC: 5.21 MIL/uL (ref 4.22–5.81)
RDW: 14.3 % (ref 11.5–15.5)
WBC: 9 10*3/uL (ref 4.0–10.5)
nRBC: 0 % (ref 0.0–0.2)

## 2020-08-15 LAB — BASIC METABOLIC PANEL
Anion gap: 6 (ref 5–15)
BUN: 26 mg/dL — ABNORMAL HIGH (ref 8–23)
CO2: 24 mmol/L (ref 22–32)
Calcium: 9.4 mg/dL (ref 8.9–10.3)
Chloride: 108 mmol/L (ref 98–111)
Creatinine, Ser: 1.52 mg/dL — ABNORMAL HIGH (ref 0.61–1.24)
GFR, Estimated: 45 mL/min — ABNORMAL LOW (ref >60)
Glucose, Bld: 146 mg/dL — ABNORMAL HIGH (ref 70–99)
Potassium: 4.7 mmol/L (ref 3.5–5.1)
Sodium: 138 mmol/L (ref 135–145)

## 2020-08-15 LAB — PROTIME-INR
INR: 1.5 — ABNORMAL HIGH (ref 0.8–1.2)
Prothrombin Time: 18 seconds — ABNORMAL HIGH (ref 11.4–15.2)

## 2020-08-15 LAB — LIPASE, BLOOD: Lipase: 29 U/L (ref 11–51)

## 2020-08-15 MED ORDER — ALUM & MAG HYDROXIDE-SIMETH 200-200-20 MG/5ML PO SUSP
15.0000 mL | Freq: Once | ORAL | Status: AC
  Administered 2020-08-15: 15 mL via ORAL
  Filled 2020-08-15: qty 30

## 2020-08-15 MED ORDER — LIDOCAINE VISCOUS HCL 2 % MT SOLN
15.0000 mL | Freq: Once | OROMUCOSAL | Status: AC
  Administered 2020-08-15: 15 mL via ORAL
  Filled 2020-08-15: qty 15

## 2020-08-15 NOTE — ED Notes (Signed)
Pt calm , collective. Denied pain or sob. Discharge instructions reviewed with pt and daughter

## 2020-08-15 NOTE — ED Notes (Signed)
Pt back from CT

## 2020-08-15 NOTE — ED Provider Notes (Signed)
[facility] Emergency Department Provider Note   ____________________________________________   Event Date/Time   First MD Initiated Contact with Patient 08/15/20 1128     (approximate)  I have reviewed the triage vital signs and the nursing notes.   HISTORY  Chief Complaint Chest Pain    HPI Joshua Frye is a 85 y.o. male with past medical history of hypertension, hyperlipidemia, CAD status post CABG, sick sinus syndrome status post pacemaker, atrial fibrillation on Eliquis, COPD, and bronchiectasis who presents to the ED complaining of chest pain.  Patient reports a dull ache in his anterior lower chest and epigastric area since shortly after waking up this morning.  Pain has been intermittent but is not exacerbated or alleviated by anything in particular.  He denies any associated fevers, cough, shortness of breath, pain or swelling in his legs.  He has never had similar symptoms in the past, states his coronary bypass was performed about 40 years ago and he has had no cardiac issues since then.  He states the discomfort is currently present but is mild.        Past Medical History:  Diagnosis Date   Actinic keratosis    Anxiety    Asthma    BPH (benign prostatic hypertrophy) 2017   Bronchiectasis (HCC)    COPD (chronic obstructive pulmonary disease) ([place])    Coronary artery disease    Dysrhythmia    asymptomatic pvcs   GERD (gastroesophageal reflux disease)    History of gout    several years ago   HOH (hard of hearing)    Bilateral Hearing Aids   Hyperlipidemia    Hypertension    Hypothyroidism    Myocardial infarction ([place]) 1985   Presence of permanent cardiac pacemaker    Sleep apnea    OSA--C-PAP   Squamous cell carcinoma of arm, 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   L forearm    Squamous cell carcinoma of arm, left 09/10/2017   L mid dorsum forearm    Squamous cell carcinoma of arm, right 06/01/2014   R prox forearm    Squamous cell carcinoma of  hand, 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 WD SCC with superficial infiltration. EDC.   Squamous cell carcinoma of skin 01/09/2020   R lower leg, EDC   Squamous cell carcinoma of skin 07/09/2020   R ear mid helix groove - ED&C    Squamous cell carcinoma, arm, right 04/27/2013   R prox dorsum forearm     Patient Active Problem List   Diagnosis Date Noted   Bacteremia due to Pseudomonas 07/07/2019   History of removal of skin mole 07/07/2019   AKI (acute kidney injury) ([place]) 07/07/2019   Pseudomonal bacteremia 07/07/2019   Medicare annual wellness visit, initial 06/29/2017   Tubular adenoma 06/29/2017   Status post placement of cardiac pacemaker 12/26/2016   Atrial fibrillation ([place]) 11/26/2016   Sick sinus syndrome ([place]) 10/15/2016   Acquired hypothyroidism 06/24/2016  BPH (benign prostatic hyperplasia) 11/21/2015   CAD (coronary artery disease), autologous vein bypass graft 11/21/2015   Adult bronchiectasis ([place]) 06/07/2015   Asymptomatic PVCs 04/16/2015   Hyperlipidemia, mixed 06/06/2014   Atherosclerosis of autologous vein coronary artery bypass graft 03/07/2014   Benign essential hypertension 03/07/2014   Chronic obstructive pulmonary disease ([place]) 03/07/2014   Coronary arteriosclerosis in native artery 03/07/2014   Enlarged prostate without lower urinary tract symptoms (luts) 03/07/2014   Hyperlipidemia 03/07/2014   Hypersomnia with sleep apnea 03/07/2014   Asthma 08/11/2013   Sleep apnea 08/11/2013   Sleep apnea with use of continuous positive airway  pressure (CPAP) 08/11/2013   Enlarged prostate with lower urinary tract symptoms (LUTS) 02/23/2012   Nocturia 02/23/2012    Past Surgical History:  Procedure Laterality Date   APPENDECTOMY     COLONOSCOPY WITH PROPOFOL N/A 05/05/2017   Procedure: COLONOSCOPY WITH PROPOFOL;  Surgeon: Manya Silvas, MD;  Location: [facility] ENDOSCOPY;  Service: Endoscopy;  Laterality: N/A;   CORONARY ANGIOPLASTY     x 2   CORONARY ARTERY BYPASS GRAFT  1985   INGUINAL HERNIA REPAIR Right 11/20/2015   Procedure: HERNIA REPAIR INGUINAL ADULT;  Surgeon: [name], MD;  Location: [facility] ORS;  Service: General;  Laterality: Right;   INSERT / REPLACE / REMOVE PACEMAKER     PACEMAKER INSERTION Left 10/15/2016   Procedure: INSERTION PACEMAKER;  Surgeon: Isaias Cowman, MD;  Location: [facility] ORS;  Service: Cardiovascular;  Laterality: Left;   PACEMAKER LEAD REMOVAL N/A 11/19/2016   Procedure: LEAD REVISION;  Surgeon: Isaias Cowman, MD;  Location: [facility] ORS;  Service: Cardiovascular;  Laterality: N/A;   TEE WITHOUT CARDIOVERSION N/A 07/14/2019   Procedure: TRANSESOPHAGEAL ECHOCARDIOGRAM (TEE);  Surgeon: Teodoro Spray, MD;  Location: [facility] ORS;  Service: Cardiovascular;  Laterality: N/A;    Prior to Admission medications   Medication Sig Start Date End Date Taking? Authorizing Provider  amLODipine (NORVASC) 10 MG tablet Take 10 mg by mouth daily.    [provider]  apixaban (ELIQUIS) 5 MG TABS tablet Take 5 mg by mouth 2 (two) times daily.    [provider]  atorvastatin (LIPITOR) 20 MG tablet TAKE ONE (1) TABLET BY MOUTH EVERY DAY 09/05/15   [provider]  budesonide (PULMICORT) 180 MCG/ACT inhaler Inhale 1 puff into the lungs daily.     [provider]  fluticasone Asencion Islam) 50 MCG/ACT nasal spray instill 2 sprays into each nostril once daily 10/06/16   [provider]  ibuprofen (ADVIL,MOTRIN) 200 MG tablet Take 400 mg by mouth every 8 (eight) hours as  needed for headache or mild pain.    [provider]  isosorbide mononitrate (IMDUR) 30 MG 24 hr tablet Take 30 mg by mouth daily.  09/07/14   [provider]  ketoconazole (NIZORAL) 2 % cream Apply to the feet and between toes QHS 06/13/19   Ralene Bathe, MD  ketoconazole (NIZORAL) 2 % cream Apply to the feet and toenails QHS. 06/18/20   Ralene Bathe, MD  montelukast (SINGULAIR) 10 MG tablet Take 10 mg by mouth daily.     [provider]  Multiple Vitamin (MULTIVITAMIN) capsule Take 1 capsule by mouth daily.    [provider]  nitroGLYCERIN (NITROSTAT) 0.4 MG SL tablet Place 0.4 mg under the tongue every 5 (five) minutes as needed for chest pain.    [provider]  NON FORMULARY     [provider]  omeprazole (PRILOSEC) 20 MG capsule  TAKE ONE (1) CAPSULE EACH DAY 09/05/15   [provider]  tamsulosin (FLOMAX) 0.4 MG CAPS capsule TAKE 1 CAPSULE(0.4 MG) BY MOUTH TWICE DAILY 10/21/19   Stoioff, Ronda Fairly, MD  trospium (SANCTURA) 20 MG tablet 1 tab 1 hour prior to bedtime 10/21/19   Stoioff, Ronda Fairly, MD  vitamin C (ASCORBIC ACID) 500 MG tablet Take 500 mg by mouth daily.    [provider]    Allergies Procardia [nifedipine]  Family History  Problem Relation Age of Onset   Diabetes Mother     Social History Social History   Tobacco Use   Smoking status: Never   Smokeless tobacco: Never  Vaping Use   Vaping Use: Never used  Substance Use Topics   Alcohol use: No   Drug use: No    Review of Systems  Constitutional: No fever/chills Eyes: No visual changes. ENT: No sore throat. Cardiovascular: Positive for chest pain. Respiratory: Denies shortness of breath. Gastrointestinal: Positive for abdominal pain.  No nausea, no vomiting.  No diarrhea.  No constipation. Genitourinary: Negative for dysuria. Musculoskeletal: Negative for back pain. Skin: Negative for rash. Neurological: Negative for headaches,  focal weakness or numbness.  ____________________________________________   PHYSICAL EXAM:  VITAL SIGNS: ED Triage Vitals  Enc Vitals Group     BP 08/15/20 1133 (!) 153/85     Pulse Rate 08/15/20 1133 65     Resp 08/15/20 1133 20     Temp 08/15/20 1133 97.8 F (36.6 C)     Temp Source 08/15/20 1133 Oral     SpO2 08/15/20 1133 97 %     Weight 08/15/20 1134 170 lb (77.1 kg)     Height 08/15/20 1134 5\' 9"  (1.753 m)     Head Circumference --      Peak Flow --      Pain Score 08/15/20 1146 3     Pain Loc --      Pain Edu? --      Excl. in [place]? --     Constitutional: Alert and oriented. Eyes: Conjunctivae are normal. Head: Atraumatic. Nose: No congestion/rhinnorhea. Mouth/Throat: Mucous membranes are moist. Neck: Normal ROM Cardiovascular: Normal rate, regular rhythm. Grossly normal heart sounds.  2+ radial pulses bilaterally. Respiratory: Normal respiratory effort.  No retractions. Lungs CTAB.  No chest wall tenderness to palpation. Gastrointestinal: Soft and nontender. No distention. Genitourinary: deferred Musculoskeletal: No lower extremity tenderness nor edema. Neurologic:  Normal speech and language. No gross focal neurologic deficits are appreciated. Skin:  Skin is warm, dry and intact. No rash noted. Psychiatric: Mood and affect are normal. Speech and behavior are normal.  ____________________________________________   LABS (all labs ordered are listed, but only abnormal results are displayed)  Labs Reviewed  BASIC METABOLIC PANEL - Abnormal; Notable for the following components:      Result Value   Glucose, Bld 146 (*)    BUN 26 (*)    Creatinine, Ser 1.52 (*)    GFR, Estimated 45 (*)    All other components within normal limits  PROTIME-INR - Abnormal; Notable for the following components:   Prothrombin Time 18.0 (*)    INR 1.5 (*)    All other components within normal limits  CBC  HEPATIC FUNCTION PANEL  LIPASE, BLOOD  TROPONIN I (HIGH SENSITIVITY)   TROPONIN I (HIGH SENSITIVITY)   ____________________________________________  EKG  ED ECG REPORT I, Blake Divine, the attending physician, personally viewed and interpreted this ECG.   Date: 08/15/2020  EKG  Time: 11:26  Rate: 76  Rhythm: Ventricular paced rhythm  Axis: Normal  Intervals:none  ST&T Change: None   PROCEDURES  Procedure(s) performed (including Critical Care):  Procedures   ____________________________________________   INITIAL IMPRESSION / ASSESSMENT AND PLAN / ED COURSE      85 year old male with past medical history of hypertension, hyperlipidemia, CAD status post CABG, sick sinus syndrome status post pacemaker, atrial fibrillation on Eliquis, COPD, and bronchiectasis who presents to the ED complaining of intermittent chest pain since after waking up this morning.  EKG shows ventricular paced rhythm with no ischemic changes and symptoms sound atypical for ACS.  We will screen 2 sets of troponin, chest x-ray and additional labs are pending.  Low suspicion for PE at this time given intermittent symptoms with reassuring vital signs.  Initial troponin is negative, chest x-ray reviewed by me and shows no infiltrate, edema, or effusion.  Additional labs are reassuring, LFTs and lipase within normal limits.  On reassessment, patient continues to complain of discomfort that is primarily in his epigastrium.  He was given a GI cocktail without significant relief but reassessment of his abdominal exam continues to show no tenderness whatsoever.  Daughter is now at bedside and expresses concern that patient has been eating a lot of fatty foods and red meat recently, I do suspect there could be some gastritis contributing to his symptoms.  He is appropriate for discharge home with PCP and cardiology follow-up, patient agrees with plan.      ____________________________________________   FINAL CLINICAL IMPRESSION(S) / ED DIAGNOSES  Final diagnoses:  Nonspecific  chest pain  Epigastric pain     ED Discharge Orders     None        Note:  This document was prepared using Dragon voice recognition software and may include unintentional dictation errors.    Blake Divine, MD 08/15/20 [phone]

## 2020-08-15 NOTE — ED Notes (Signed)
Patient transported to CT 

## 2020-08-15 NOTE — ED Triage Notes (Signed)
Pt to ED POV for centralized cp that started last night. +nausea. Denies shob.  Pt alert and oriented.  Hx triple heart bypass

## 2020-08-15 NOTE — ED Notes (Signed)
ED Provider at bedside. 

## 2020-08-15 NOTE — ED Notes (Signed)
Daughter at bedside.

## 2020-08-16 ENCOUNTER — Ambulatory Visit
Admission: RE | Admit: 2020-08-16 | Discharge: 2020-08-16 | Disposition: A | Discharge status: Home / Self Care | Payer: Medicare Other | Source: Ambulatory Visit | Attending: Cardiology | Admitting: Cardiology

## 2020-08-16 ENCOUNTER — Other Ambulatory Visit: Payer: Self-pay | Admitting: Cardiology

## 2020-08-16 DIAGNOSIS — R109 Unspecified abdominal pain: Secondary | ICD-10-CM | POA: Diagnosis present

## 2020-10-22 ENCOUNTER — Ambulatory Visit: Payer: Self-pay | Admitting: Urology

## 2020-10-23 NOTE — Progress Notes (Signed)
10/24/2020 9:48 AM   Joshua Frye [birth year] [id number]  Referring provider: [name], MD [facility] [place],  [place] [address]  Chief Complaint  Patient presents with   Benign Prostatic Hypertrophy    Urologic history: 1.  BPH with lower urinary tract symptoms Postop urinary retention after herniorrhaphy 2017 Tamsulosin 0.8 mg  HPI: 85 y.o. male presents for annual follow-up.  At last years visit noted worsening nocturia at 3-4 Potential etiologies were discussed and he initially elected a trial of trospium 20 mg at bedtime This was not covered by his insurance and he was given a trial of Myrbetriq and does not recall if this was effective He was started on extended release oxybutynin 5 mg daily by Dr. Sabra Heck June 2022 and has seen no improvement in his nocturia stating he is up every 1-2 hours He does have sleep apnea and uses CPAP intermittently  Remains on tamsulosin 0.8 mg daily Denies dysuria, gross hematuria Denies flank, abdominal or pelvic pain PCP has continued to check his PSA which was 0.99 07/18/2020   PMH: Past Medical History:  Diagnosis Date   Actinic keratosis    Anxiety    Asthma    BPH (benign prostatic hypertrophy) 2017   Bronchiectasis (HCC)    COPD (chronic obstructive pulmonary disease) ([place])    Coronary artery disease    Dysrhythmia    asymptomatic pvcs   GERD (gastroesophageal reflux disease)    History of gout    several years ago   HOH (hard of hearing)    Bilateral Hearing Aids   Hyperlipidemia    Hypertension    Hypothyroidism    Myocardial infarction ([place]) 1985   Presence of permanent cardiac pacemaker    Sleep apnea    OSA--C-PAP   Squamous cell carcinoma of arm, 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   L forearm    Squamous cell carcinoma of arm, left 09/10/2017   L mid dorsum forearm    Squamous cell carcinoma of arm, right 06/01/2014   R prox forearm    Squamous cell carcinoma of hand, right  04/06/2018   R hand thumb webspace    Squamous cell carcinoma of leg, left 05/24/2018   L mid lat pretibial    Squamous cell carcinoma of leg, left 05/24/2018   L mid ant med thigh    Squamous cell carcinoma of leg, left 05/24/2018   L med mid calf   Squamous cell carcinoma of leg, right 01/16/2015   R lat calf   Squamous cell carcinoma of skin 01/27/2013   R post forearm   Squamous cell carcinoma of skin 02/03/2019   Left distal medial forearm. WD SCC with superficial infiltration. EDC.   Squamous cell carcinoma of skin 01/09/2020   R lower leg, EDC   Squamous cell carcinoma of skin 07/09/2020   R ear mid helix groove - ED&C    Squamous cell carcinoma, arm, right 04/27/2013   R prox dorsum forearm     Surgical History: Past Surgical History:  Procedure Laterality Date   APPENDECTOMY     COLONOSCOPY WITH PROPOFOL N/A 05/05/2017   Procedure: COLONOSCOPY WITH PROPOFOL;  Surgeon: Manya Silvas, MD;  Location: [facility] ENDOSCOPY;  Service: Endoscopy;  Laterality: N/A;   CORONARY ANGIOPLASTY     x 2   CORONARY ARTERY BYPASS GRAFT  1985   INGUINAL HERNIA REPAIR Right 11/20/2015   Procedure: HERNIA REPAIR INGUINAL ADULT;  Surgeon: [name], MD;  Location:  [facility] ORS;  Service: General;  Laterality: Right;   INSERT / REPLACE / REMOVE PACEMAKER     PACEMAKER INSERTION Left 10/15/2016   Procedure: INSERTION PACEMAKER;  Surgeon: Isaias Cowman, MD;  Location: [facility] ORS;  Service: Cardiovascular;  Laterality: Left;   PACEMAKER LEAD REMOVAL N/A 11/19/2016   Procedure: LEAD REVISION;  Surgeon: Isaias Cowman, MD;  Location: [facility] ORS;  Service: Cardiovascular;  Laterality: N/A;   TEE WITHOUT CARDIOVERSION N/A 07/14/2019   Procedure: TRANSESOPHAGEAL ECHOCARDIOGRAM (TEE);  Surgeon: Teodoro Spray, MD;  Location: [facility] ORS;  Service: Cardiovascular;  Laterality: N/A;    Home Medications:  Allergies as of 10/24/2020       Reactions   Procardia [nifedipine] Other (See  Comments)   Other reaction(s): Unknown dizzy Gets very woozy with this medication        Medication List        Accurate as of October 24, 2020  9:48 AM. If you have any questions, ask your nurse or doctor.          amLODipine 10 MG tablet Commonly known as: NORVASC Take 10 mg by mouth daily.   apixaban 5 MG Tabs tablet Commonly known as: ELIQUIS Take 5 mg by mouth 2 (two) times daily.   atorvastatin 20 MG tablet Commonly known as: LIPITOR TAKE ONE (1) TABLET BY MOUTH EVERY DAY   budesonide 180 MCG/ACT inhaler Commonly known as: PULMICORT Inhale 1 puff into the lungs daily.   fluticasone 50 MCG/ACT nasal spray Commonly known as: FLONASE instill 2 sprays into each nostril once daily   ibuprofen 200 MG tablet Commonly known as: ADVIL Take 400 mg by mouth every 8 (eight) hours as needed for headache or mild pain.   isosorbide mononitrate 30 MG 24 hr tablet Commonly known as: IMDUR Take 30 mg by mouth daily.   ketoconazole 2 % cream Commonly known as: NIZORAL Apply to the feet and between toes QHS   ketoconazole 2 % cream Commonly known as: NIZORAL Apply to the feet and toenails QHS.   montelukast 10 MG tablet Commonly known as: SINGULAIR Take 10 mg by mouth daily.   multivitamin capsule Take 1 capsule by mouth daily.   nitroGLYCERIN 0.4 MG SL tablet Commonly known as: NITROSTAT Place 0.4 mg under the tongue every 5 (five) minutes as needed for chest pain.   NON FORMULARY   omeprazole 20 MG capsule Commonly known as: PRILOSEC TAKE ONE (1) CAPSULE EACH DAY   oxybutynin 5 MG tablet Commonly known as: DITROPAN Take 5 mg by mouth 3 (three) times daily.   tamsulosin 0.4 MG Caps capsule Commonly known as: FLOMAX TAKE 1 CAPSULE(0.4 MG) BY MOUTH TWICE DAILY   trospium 20 MG tablet Commonly known as: SANCTURA 1 tab 1 hour prior to bedtime   vitamin C 500 MG tablet Commonly known as: ASCORBIC ACID Take 500 mg by mouth daily.         Allergies:  Allergies  Allergen Reactions   Procardia [Nifedipine] Other (See Comments)    Other reaction(s): Unknown dizzy Gets very woozy with this medication    Family History: Family History  Problem Relation Age of Onset   Diabetes Mother     Social History:  reports that he has never smoked. He has never used smokeless tobacco. He reports that he does not drink alcohol and does not use drugs.   Physical Exam: BP 132/74   Pulse 70   Ht '5\' 9"'$  (1.753 m)   Wt 169 lb (76.7  kg)   BMI 24.96 kg/m   Constitutional:  Alert and oriented, No acute distress. HEENT: [place] AT, moist mucus membranes.  Trachea midline, no masses. Cardiovascular: No clubbing, cyanosis, or edema. Respiratory: Normal respiratory effort, no increased work of breathing. Neurologic: Grossly intact, no focal deficits, moving all 4 extremities. Psychiatric: Normal mood and affect.  Laboratory Data:  Urinalysis Dipstick/microscopy negative   Assessment & Plan:    1.  Nocturia His most bothersome symptom and worsening No improvement with extended release oxybutynin prescribed by Dr. Sabra Heck We discussed that nocturia is a difficult problem to treat due to multiple etiologies We discussed that sleep apnea is a common cause of nocturia and would recommend using his CPAP regularly We also discussed possibility of nocturnal polyuria and based on his age would not recommend DDAVP.  Could consider Nocdurna however would most likely be cost prohibitive as insurance coverage is limited He was interested in a trial of further medication and given samples Gemtesa 75 mg daily x4 weeks.  He will call back regarding efficacy  2.  BPH with LUTS Stable Tamsulosin was refilled   Abbie Sons, MD  [facility] [address], [place], [place] [address] [phone]

## 2020-10-24 ENCOUNTER — Ambulatory Visit (INDEPENDENT_AMBULATORY_CARE_PROVIDER_SITE_OTHER): Payer: Medicare Other | Admitting: Urology

## 2020-10-24 ENCOUNTER — Encounter: Payer: Self-pay | Admitting: Urology

## 2020-10-24 ENCOUNTER — Other Ambulatory Visit: Payer: Self-pay

## 2020-10-24 VITALS — BP 132/74 | HR 70 | Ht 69.0 in | Wt 169.0 lb

## 2020-10-24 DIAGNOSIS — R351 Nocturia: Secondary | ICD-10-CM

## 2020-10-24 DIAGNOSIS — N401 Enlarged prostate with lower urinary tract symptoms: Secondary | ICD-10-CM | POA: Diagnosis not present

## 2020-10-24 DIAGNOSIS — Z87898 Personal history of other specified conditions: Secondary | ICD-10-CM | POA: Diagnosis not present

## 2020-10-24 MED ORDER — TAMSULOSIN HCL 0.4 MG PO CAPS
ORAL_CAPSULE | ORAL | 3 refills | Status: DC
Start: 2020-10-24 — End: 2020-12-17

## 2020-10-24 MED ORDER — GEMTESA 75 MG PO TABS: 75.0000 mg | ORAL_TABLET | Freq: Every day | ORAL | 0 refills | Status: DC

## 2020-10-26 LAB — MICROSCOPIC EXAMINATION

## 2020-10-26 LAB — URINALYSIS, COMPLETE
Bilirubin, UA: NEGATIVE
Glucose, UA: NEGATIVE
Ketones, UA: NEGATIVE
Leukocytes,UA: NEGATIVE
Nitrite, UA: NEGATIVE
Protein,UA: NEGATIVE
RBC, UA: NEGATIVE
Specific Gravity, UA: 1.015 (ref 1.005–1.030)
Urobilinogen, Ur: 0.2 mg/dL (ref 0.2–1.0)
pH, UA: 5 (ref 5.0–7.5)

## 2020-11-14 ENCOUNTER — Telehealth: Payer: Self-pay

## 2020-11-14 ENCOUNTER — Other Ambulatory Visit: Payer: Self-pay | Admitting: Urology

## 2020-11-14 DIAGNOSIS — R351 Nocturia: Secondary | ICD-10-CM

## 2020-11-14 MED ORDER — GEMTESA 75 MG PO TABS: 75.0000 mg | ORAL_TABLET | Freq: Every day | ORAL | 11 refills | Status: DC

## 2020-11-14 NOTE — Telephone Encounter (Signed)
Incoming call from pt who states the he cannot afford Gemtesa and would like to know if there are alternatives he can take. Please advise.

## 2020-11-14 NOTE — Telephone Encounter (Signed)
Incoming call from pt who states that he was given samples of Gemtesa and told to call back with efficacy. Pt states that medication is working well and he would like an Careers information officer. RX sent.

## 2020-11-15 NOTE — Telephone Encounter (Signed)
I spoke to patient and I am going to attempt a PA to get it approved. Patient states the medication is helping.

## 2020-11-21 ENCOUNTER — Other Ambulatory Visit: Payer: Self-pay | Admitting: Urology

## 2020-11-21 DIAGNOSIS — R351 Nocturia: Secondary | ICD-10-CM

## 2020-11-26 ENCOUNTER — Other Ambulatory Visit: Payer: Self-pay | Admitting: Urology

## 2020-11-26 ENCOUNTER — Telehealth: Payer: Self-pay | Admitting: Family Medicine

## 2020-11-26 DIAGNOSIS — R351 Nocturia: Secondary | ICD-10-CM

## 2020-11-26 NOTE — Telephone Encounter (Signed)
Patient notified Logan Bores has been approved.  Authorization: [id number]

## 2020-12-17 ENCOUNTER — Other Ambulatory Visit: Payer: Self-pay | Admitting: Urology

## 2020-12-17 DIAGNOSIS — R351 Nocturia: Secondary | ICD-10-CM

## 2020-12-17 DIAGNOSIS — Z87898 Personal history of other specified conditions: Secondary | ICD-10-CM

## 2020-12-17 MED ORDER — TAMSULOSIN HCL 0.4 MG PO CAPS: ORAL_CAPSULE | ORAL | 3 refills | Status: DC

## 2020-12-27 ENCOUNTER — Ambulatory Visit (INDEPENDENT_AMBULATORY_CARE_PROVIDER_SITE_OTHER): Payer: Medicare Other | Admitting: Dermatology

## 2020-12-27 ENCOUNTER — Other Ambulatory Visit: Payer: Self-pay

## 2020-12-27 DIAGNOSIS — L821 Other seborrheic keratosis: Secondary | ICD-10-CM

## 2020-12-27 DIAGNOSIS — L820 Inflamed seborrheic keratosis: Secondary | ICD-10-CM | POA: Diagnosis not present

## 2020-12-27 DIAGNOSIS — L578 Other skin changes due to chronic exposure to nonionizing radiation: Secondary | ICD-10-CM | POA: Diagnosis not present

## 2020-12-27 NOTE — Patient Instructions (Signed)

## 2020-12-27 NOTE — Progress Notes (Signed)
   Follow-Up Visit   Subjective  Joshua Frye is a 85 y.o. male who presents for the following: check spots (L post scalp 43m, irritating/L clavicle area 60m, irritating).  The following portions of the chart were reviewed this encounter and updated as appropriate:   Tobacco  Allergies  Meds  Problems  Med Hx  Surg Hx  Fam Hx     Review of Systems:  No other skin or systemic complaints except as noted in HPI or Assessment and Plan.  Objective  Well appearing patient in no apparent distress; mood and affect are within normal limits.  A focused examination was performed including scalp, chest. Relevant physical exam findings are noted in the Assessment and Plan.  L clavicle x 1, L post scalp x 1, Total = 2 Erythematous keratotic or waxy stuck-on papule or plaque.    Assessment & Plan   Seborrheic Keratoses - Stuck-on, waxy, tan-brown papules and/or plaques  - Benign-appearing - Discussed benign etiology and prognosis. - Observe - Call for any changes  Actinic Damage - chronic, secondary to cumulative UV radiation exposure/sun exposure over time - diffuse scaly erythematous macules with underlying dyspigmentation - Recommend daily broad spectrum sunscreen SPF 30+ to sun-exposed areas, reapply every 2 hours as needed.  - Recommend staying in the shade or wearing long sleeves, sun glasses (UVA+UVB protection) and wide brim hats (4-inch brim around the entire circumference of the hat). - Call for new or changing lesions.  Inflamed seborrheic keratosis L clavicle x 1, L post scalp x 1, Total = 2  Destruction of lesion - L clavicle x 1, L post scalp x 1, Total = 2 Complexity: simple   Destruction method: cryotherapy   Informed consent: discussed and consent obtained   Timeout:  patient name, date of birth, surgical site, and procedure verified Lesion destroyed using liquid nitrogen: Yes   Region frozen until ice ball extended beyond lesion: Yes   Outcome: patient tolerated  procedure well with no complications   Post-procedure details: wound care instructions given    Return for as scheduled for TBSE , Hx of SCC, Hx of AKs.  I, Othelia Pulling, RMA, am acting as scribe for Sarina Ser, MD . Documentation: I have reviewed the above documentation for accuracy and completeness, and I agree with the above.  Sarina Ser, MD

## 2020-12-30 ENCOUNTER — Encounter: Payer: Self-pay | Admitting: Dermatology

## 2021-02-27 ENCOUNTER — Telehealth: Payer: Self-pay

## 2021-02-27 NOTE — Telephone Encounter (Addendum)
LMOM for patient to return call to get more information about Gemtesa.

## 2021-02-27 NOTE — Telephone Encounter (Signed)
Patient's daughter left a message on the triage line requesting a call back in reference to patient's [facility] script. (Daughter is not listed on DPR)

## 2021-02-28 MED ORDER — TROSPIUM CHLORIDE 20 MG PO TABS: ORAL_TABLET | ORAL | 0 refills | Status: DC

## 2021-02-28 NOTE — Telephone Encounter (Signed)
Sent medication , patient coming by to get good rx

## 2021-02-28 NOTE — Telephone Encounter (Signed)
Pt calling stating that the Logan Bores is too expensive, his copay is $250.00 and he can't afford that. Pt would like something else. Please advise

## 2021-02-28 NOTE — Telephone Encounter (Signed)
Recommend trospium 20 mg 1 hour prior to bedtime #30.  Least expensive would be with good Rx discount at either [facility] or [facility]

## 2021-03-21 ENCOUNTER — Other Ambulatory Visit: Payer: Self-pay | Admitting: *Deleted

## 2021-03-21 ENCOUNTER — Telehealth: Payer: Self-pay | Admitting: Urology

## 2021-03-21 NOTE — Telephone Encounter (Signed)
Send medication refill to [facility] to sign off on .

## 2021-03-21 NOTE — Telephone Encounter (Signed)
Patient came by and stated that he started taking the Trospium and stated that he really likes it and it helps.  He is requesting a refill to be sent [facility] in [place] for 90 days at a time.

## 2021-03-22 MED ORDER — TROSPIUM CHLORIDE 20 MG PO TABS: ORAL_TABLET | ORAL | 0 refills | Status: DC

## 2021-06-24 ENCOUNTER — Ambulatory Visit (INDEPENDENT_AMBULATORY_CARE_PROVIDER_SITE_OTHER): Payer: Medicare Other | Admitting: Dermatology

## 2021-06-24 DIAGNOSIS — L821 Other seborrheic keratosis: Secondary | ICD-10-CM

## 2021-06-24 DIAGNOSIS — C44519 Basal cell carcinoma of skin of other part of trunk: Secondary | ICD-10-CM | POA: Diagnosis not present

## 2021-06-24 DIAGNOSIS — L578 Other skin changes due to chronic exposure to nonionizing radiation: Secondary | ICD-10-CM

## 2021-06-24 DIAGNOSIS — L820 Inflamed seborrheic keratosis: Secondary | ICD-10-CM | POA: Diagnosis not present

## 2021-06-24 DIAGNOSIS — D492 Neoplasm of unspecified behavior of bone, soft tissue, and skin: Secondary | ICD-10-CM

## 2021-06-24 DIAGNOSIS — L720 Epidermal cyst: Secondary | ICD-10-CM

## 2021-06-24 NOTE — Progress Notes (Signed)
? ?Follow-Up Visit ?  ?Subjective  ?Joshua Frye is a 86 y.o. male who presents for the following: Skin Problem (The patient has spots, moles and lesions to be evaluated, some may be new or changing and the patient has concerns that these could be cancer. ). ? ?The following portions of the chart were reviewed this encounter and updated as appropriate:  ? Tobacco  Allergies  Meds  Problems  Med Hx  Surg Hx  Fam Hx   ?  ?Review of Systems:  No other skin or systemic complaints except as noted in HPI or Assessment and Plan. ? ?Objective  ?Well appearing patient in no apparent distress; mood and affect are within normal limits. ? ?A focused examination was performed including face,chest,arms . Relevant physical exam findings are noted in the Assessment and Plan. ? ?right lateral elbow, right temple  (2) (2) ?Stuck-on, waxy, tan-brown papules --Discussed benign etiology and prognosis.  ? ?left forehead ?Subcutaneous nodule.  ? ?right chest ?0.7 cm erythematous irregular papule  ? ? ? ? ? ? ? ?Assessment & Plan  ?Inflamed seborrheic keratosis (2) ?right lateral elbow, right temple  (2) ? ?Destruction of lesion - right lateral elbow, right temple  (2) ?Complexity: simple   ?Destruction method: cryotherapy   ?Informed consent: discussed and consent obtained   ?Timeout:  patient name, date of birth, surgical site, and procedure verified ?Lesion destroyed using liquid nitrogen: Yes   ?Region frozen until ice ball extended beyond lesion: Yes   ?Outcome: patient tolerated procedure well with no complications   ?Post-procedure details: wound care instructions given   ? ?Epidermal inclusion cyst ?left forehead ?Benign-appearing. Exam most consistent with an epidermal inclusion cyst. Discussed that a cyst is a benign growth that can grow over time and sometimes get irritated or inflamed. Recommend observation if it is not bothersome. Discussed option of surgical excision to remove it if it is growing, symptomatic, or  other changes noted. Please call for new or changing lesions so they can be evaluated. ? ?Neoplasm of skin ?right chest ?Epidermal / dermal shaving ? ?Lesion diameter (cm):  0.7 ?Informed consent: discussed and consent obtained   ?Timeout: patient name, date of birth, surgical site, and procedure verified   ?Procedure prep:  Patient was prepped and draped in usual sterile fashion ?Prep type:  Isopropyl alcohol ?Anesthesia: the lesion was anesthetized in a standard fashion   ?Anesthetic:  1% lidocaine w/ epinephrine 1-100,000 buffered w/ 8.4% NaHCO3 ?Hemostasis achieved with: pressure, aluminum chloride and electrodesiccation   ?Outcome: patient tolerated procedure well   ?Post-procedure details: sterile dressing applied and wound care instructions given   ?Dressing type: bandage and petrolatum   ? ?Specimen 1 - Surgical pathology ?Differential Diagnosis: R/O SCC vs Cyst ?Check Margins: No ? ?Actinic Damage ?- chronic, secondary to cumulative UV radiation exposure/sun exposure over time ?- diffuse scaly erythematous macules with underlying dyspigmentation ?- Recommend daily broad spectrum sunscreen SPF 30+ to sun-exposed areas, reapply every 2 hours as needed.  ?- Recommend staying in the shade or wearing long sleeves, sun glasses (UVA+UVB protection) and wide brim hats (4-inch brim around the entire circumference of the hat). ?- Call for new or changing lesions.  ? ?Seborrheic Keratoses ?- Stuck-on, waxy, tan-brown papules and/or plaques  ?- Benign-appearing ?- Discussed benign etiology and prognosis. ?- Observe ?- Call for any changes ?  ?Return in about 3 months (around 09/24/2021) for TBSE. ? ?I, Marye Round, CMA, am acting as scribe for Sarina Ser, MD .  ?  Documentation: I have reviewed the above documentation for accuracy and completeness, and I agree with the above. ? ?Sarina Ser, MD ? ?

## 2021-06-24 NOTE — Patient Instructions (Addendum)
Wound Care Instructions ? ?Cleanse wound gently with soap and water once a day then pat dry with clean gauze. Apply a thing coat of Petrolatum (petroleum jelly, "Vaseline") over the wound (unless you have an allergy to this). We recommend that you use a new, sterile tube of Vaseline. Do not pick or remove scabs. Do not remove the yellow or white "healing tissue" from the base of the wound. ? ?Cover the wound with fresh, clean, nonstick gauze and secure with paper tape. You may use Band-Aids in place of gauze and tape if the would is small enough, but would recommend trimming much of the tape off as there is often too much. Sometimes Band-Aids can irritate the skin. ? ?You should call the office for your biopsy report after 1 week if you have not already been contacted. ? ?If you experience any problems, such as abnormal amounts of bleeding, swelling, significant bruising, significant pain, or evidence of infection, please call the office immediately. ? ?FOR ADULT SURGERY PATIENTS: If you need something for pain relief you may take 1 extra strength Tylenol (acetaminophen) AND 2 Ibuprofen ('200mg'$  each) together every 4 hours as needed for pain. (do not take these if you are allergic to them or if you have a reason you should not take them.) Typically, you may only need pain medication for 1 to 3 days.  ? ? ? ? ? ? ?Prior to procedure, discussed risks of blister formation, small wound, skin dyspigmentation, or rare scar following cryotherapy. Recommend Vaseline ointment to treated areas while healing.  ? ? ? ?If You Need Anything After Your Visit ? ?If you have any questions or concerns for your doctor, please call our main line at [phone] and press option 4 to reach your doctor's medical assistant. If no one answers, please leave a voicemail as directed and we will return your call as soon as possible. Messages left after 4 pm will be answered the following business day.  ? ?You may also send [place] a message via  MyChart. We typically respond to MyChart messages within 1-2 business days. ? ?For prescription refills, please ask your pharmacy to contact our office. Our fax number is [phone]. ? ?If you have an urgent issue when the clinic is closed that cannot wait until the next business day, you can page your doctor at the number below.   ? ?Please note that while we do our best to be available for urgent issues outside of office hours, we are not available 24/7.  ? ?If you have an urgent issue and are unable to reach [place], you may choose to seek medical care at your doctor's office, retail clinic, urgent care center, or emergency room. ? ?If you have a medical emergency, please immediately call 911 or go to the emergency department. ? ?Pager Numbers ? ?- Dr. Nehemiah Massed: [phone] ? ?- Dr. Laurence Ferrari: [phone] ? ?- Dr. Nicole Kindred: [phone] ? ?In the event of inclement weather, please call our main line at [phone] for an update on the status of any delays or closures. ? ?Dermatology Medication Tips: ?Please keep the boxes that topical medications come in in order to help keep track of the instructions about where and how to use these. Pharmacies typically print the medication instructions only on the boxes and not directly on the medication tubes.  ? ?If your medication is too expensive, please contact our office at [phone] option 4 or send [place] a message through [place].  ? ?We are unable to tell  what your co-pay for medications will be in advance as this is different depending on your insurance coverage. However, we may be able to find a substitute medication at lower cost or fill out paperwork to get insurance to cover a needed medication.  ? ?If a prior authorization is required to get your medication covered by your insurance company, please allow [place] 1-2 business days to complete this process. ? ?Drug prices often vary depending on where the prescription is filled and some pharmacies may offer cheaper  prices. ? ?The website www.goodrx.com contains coupons for medications through different pharmacies. The prices here do not account for what the cost may be with help from insurance (it may be cheaper with your insurance), but the website can give you the price if you did not use any insurance.  ?- You can print the associated coupon and take it with your prescription to the pharmacy.  ?- You may also stop by our office during regular business hours and pick up a GoodRx coupon card.  ?- If you need your prescription sent electronically to a different pharmacy, notify our office through [facility] or by phone at [phone] option 4. ? ? ? ? ?Si Usted Necesita Algo Despu?s de Su Visita ? ?Tambi?n puede enviarnos un mensaje a trav?s de MyChart. Por lo general respondemos a los mensajes de MyChart en el transcurso de 1 a 2 d?as h?biles. ? ?Para renovar recetas, por favor pida a su farmacia que se ponga en contacto con nuestra oficina. Nuestro n?mero de fax es el [phone]. ? ?Si tiene un asunto urgente cuando la cl?nica est? cerrada y que no puede esperar hasta el siguiente d?a h?bil, puede llamar/localizar a su doctor(a) al n?mero que aparece a continuaci?n.  ? ?Por favor, tenga en cuenta que aunque hacemos todo lo posible para estar disponibles para asuntos urgentes fuera del horario de oficina, no estamos disponibles las 24 horas del d?a, los 7 d?as de la semana.  ? ?Si tiene un problema urgente y no puede comunicarse con nosotros, puede optar por buscar atenci?n m?dica  en el consultorio de su doctor(a), en una cl?nica privada, en un centro de atenci?n urgente o en una sala de emergencias. ? ?Si tiene Engineer, maintenance (IT) m?dica, por favor llame inmediatamente al 911 o vaya a la sala de emergencias. ? ?N?meros de b?per ? ?- Dr. Nehemiah Massed: [phone] ? ?- Dra. Moye: [phone] ? ?- Dra. Nicole Kindred: [phone] ? ?En caso de inclemencias del tiempo, por favor llame a nuestra l?nea principal al [phone]  para una actualizaci?n sobre el estado de cualquier retraso o cierre. ? ?Consejos para la medicaci?n en dermatolog?a: ?Por favor, guarde las cajas en las que vienen los medicamentos de uso t?pico para ayudarle a seguir las instrucciones sobre d?nde y c?mo usarlos. Las farmacias generalmente imprimen las instrucciones del medicamento s?lo en las cajas y no directamente en los tubos del [place].  ? ?Si su medicamento es muy caro, por favor, p?ngase en contacto con Zigmund Daniel llamando al [phone] y presione la opci?n 4 o env?enos un mensaje a trav?s de MyChart.  ? ?No podemos decirle cu?l ser? su copago por los medicamentos por adelantado ya que esto es diferente dependiendo de la cobertura de su seguro. Sin embargo, es posible que podamos encontrar un medicamento sustituto a Electrical engineer un formulario para que el seguro cubra el medicamento que se considera necesario.  ? ?Si se requiere Ardelia Mems autorizaci?n previa para que su compa??a de seguros [place] su  medicamento, por favor perm?tanos de 1 a 2 d?as h?biles para completar este proceso. ? ?Los precios de los medicamentos var?an con frecuencia dependiendo del Environmental consultant de d?nde se surte la receta y alguna farmacias pueden ofrecer precios m?s baratos. ? ?El sitio web www.goodrx.com tiene cupones para medicamentos de Airline pilot. Los precios aqu? no tienen en cuenta lo que podr?a costar con la ayuda del seguro (puede ser m?s barato con su seguro), pero el sitio web puede darle el precio si no utiliz? ning?n seguro.  ?- Puede imprimir el cup?n correspondiente y llevarlo con su receta a la farmacia.  ?- Tambi?n puede pasar por nuestra oficina durante el horario de atenci?n regular y recoger una tarjeta de cupones de GoodRx.  ?- Si necesita que su receta se env?e electr?nicamente a Chiropodist, informe a nuestra oficina a trav?s de MyChart de [place] o por tel?fono llamando al [phone] y presione la opci?n 4.  ?

## 2021-06-25 ENCOUNTER — Other Ambulatory Visit: Payer: Self-pay | Admitting: Urology

## 2021-06-27 ENCOUNTER — Telehealth: Payer: Self-pay

## 2021-06-27 NOTE — Telephone Encounter (Signed)
-----   Message from Ralene Bathe, MD sent at 06/25/2021  8:49 PM EDT ----- ?Diagnosis ?Skin , right chest ?WELL DIFFERENTIATED SQUAMOUS CELL CARCINOMA, BASE INVOLVED ? ?Cancer-squamous cell carcinoma ?Scheduled for surgery ?

## 2021-06-27 NOTE — Telephone Encounter (Signed)
LM on VM please return my call  

## 2021-07-02 ENCOUNTER — Encounter: Payer: Self-pay | Admitting: Dermatology

## 2021-07-04 ENCOUNTER — Telehealth: Payer: Self-pay

## 2021-07-04 NOTE — Telephone Encounter (Addendum)
Tried calling patient regarding results and need for surgery to be scheduled. No answer. LMOM for patient to return call ?----- Message from Ralene Bathe, MD sent at 06/25/2021  8:49 PM EDT ----- ?Diagnosis ?Skin , right chest ?WELL DIFFERENTIATED SQUAMOUS CELL CARCINOMA, BASE INVOLVED ? ?Cancer-squamous cell carcinoma ?Scheduled for surgery ?

## 2021-07-16 ENCOUNTER — Telehealth: Payer: Self-pay

## 2021-07-16 NOTE — Telephone Encounter (Signed)
Patient informed of pathology results and appointment scheduled.  °

## 2021-07-16 NOTE — Telephone Encounter (Signed)
-----   Message from Ralene Bathe, MD sent at 06/25/2021  8:49 PM EDT ----- Diagnosis Skin , right chest WELL DIFFERENTIATED SQUAMOUS CELL CARCINOMA, BASE INVOLVED  Cancer-squamous cell carcinoma Scheduled for surgery

## 2021-08-01 ENCOUNTER — Encounter: Payer: Self-pay | Admitting: Emergency Medicine

## 2021-08-01 ENCOUNTER — Telehealth: Payer: Self-pay

## 2021-08-01 ENCOUNTER — Emergency Department: Payer: Medicare Other

## 2021-08-01 ENCOUNTER — Emergency Department
Admission: EM | Admit: 2021-08-01 | Discharge: 2021-08-01 | Disposition: A | Discharge status: Home / Self Care | Payer: Medicare Other | Attending: Emergency Medicine | Admitting: Emergency Medicine

## 2021-08-01 DIAGNOSIS — N390 Urinary tract infection, site not specified: Secondary | ICD-10-CM | POA: Insufficient documentation

## 2021-08-01 DIAGNOSIS — Z7901 Long term (current) use of anticoagulants: Secondary | ICD-10-CM | POA: Diagnosis not present

## 2021-08-01 DIAGNOSIS — R319 Hematuria, unspecified: Secondary | ICD-10-CM

## 2021-08-01 LAB — URINALYSIS, ROUTINE W REFLEX MICROSCOPIC
Bacteria, UA: NONE SEEN
RBC / HPF: >50 RBC/hpf — ABNORMAL HIGH (ref 0–5)
Specific Gravity, Urine: 1.016 (ref 1.005–1.030)
Squamous Epithelial / HPF: NONE SEEN (ref 0–5)

## 2021-08-01 LAB — BASIC METABOLIC PANEL
Anion gap: 6 (ref 5–15)
BUN: 24 mg/dL — ABNORMAL HIGH (ref 8–23)
CO2: 24 mmol/L (ref 22–32)
Calcium: 8.8 mg/dL — ABNORMAL LOW (ref 8.9–10.3)
Chloride: 108 mmol/L (ref 98–111)
Creatinine, Ser: 1.45 mg/dL — ABNORMAL HIGH (ref 0.61–1.24)
GFR, Estimated: 48 mL/min — ABNORMAL LOW (ref >60)
Glucose, Bld: 118 mg/dL — ABNORMAL HIGH (ref 70–99)
Potassium: 3.9 mmol/L (ref 3.5–5.1)
Sodium: 138 mmol/L (ref 135–145)

## 2021-08-01 LAB — CBC
HCT: 47.4 % (ref 39.0–52.0)
Hemoglobin: 15.5 g/dL (ref 13.0–17.0)
MCH: 29.5 pg (ref 26.0–34.0)
MCHC: 32.7 g/dL (ref 30.0–36.0)
MCV: 90.1 fL (ref 80.0–100.0)
Platelets: 170 10*3/uL (ref 150–400)
RBC: 5.26 MIL/uL (ref 4.22–5.81)
RDW: 14.5 % (ref 11.5–15.5)
WBC: 7.6 10*3/uL (ref 4.0–10.5)
nRBC: 0 % (ref 0.0–0.2)

## 2021-08-01 LAB — SAMPLE TO BLOOD BANK

## 2021-08-01 MED ORDER — CEPHALEXIN 500 MG PO CAPS
500.0000 mg | ORAL_CAPSULE | Freq: Once | ORAL | Status: AC
Start: 2021-08-01 — End: 2021-08-01
  Administered 2021-08-01: 500 mg via ORAL
  Filled 2021-08-01: qty 1

## 2021-08-01 MED ORDER — CEPHALEXIN 500 MG PO CAPS: 500.0000 mg | ORAL_CAPSULE | Freq: Three times a day (TID) | ORAL | 0 refills | Status: AC

## 2021-08-01 MED ORDER — IOHEXOL 300 MG/ML  SOLN
100.0000 mL | Freq: Once | INTRAMUSCULAR | Status: AC | PRN
  Administered 2021-08-01: 100 mL via INTRAVENOUS

## 2021-08-01 NOTE — ED Provider Notes (Signed)
[facility] Provider Note    Event Date/Time   First MD Initiated Contact with Patient 08/01/21 [phone]     (approximate)   History   Hematuria   HPI  DEMETRIO LEIGHTY is a 86 y.o. male who noticed blood in the urine today.  His wife reports was very dark and you could not even see the bottom of the toilet bowl fluid.  This happened twice today.  It has not happened before.  Patient takes Eliquis for A-fib.  He called [facility] and was told not to take the evening dose of Eliquis today.  He denies any fever chills urgency or dysuria.      Physical Exam   Triage Vital Signs: ED Triage Vitals  Enc Vitals Group     BP 08/01/21 0025 (!) 149/89     Pulse Rate 08/01/21 0025 79     Resp 08/01/21 0025 18     Temp 08/01/21 0025 97.8 F (36.6 C)     Temp Source 08/01/21 0025 Oral     SpO2 08/01/21 0025 96 %     Weight 08/01/21 0024 171 lb 15.3 oz (78 kg)     Height 08/01/21 0024 '5\' 9"'$  (1.753 m)     Head Circumference --      Peak Flow --      Pain Score 08/01/21 0024 0     Pain Loc --      Pain Edu? --      Excl. in [place]? --     Most recent vital signs: Vitals:   08/01/21 0025 08/01/21 0405  BP: (!) 149/89 (!) 141/84  Pulse: 79 76  Resp: 18 18  Temp: 97.8 F (36.6 C)   SpO2: 96% 98%     General: Awake, no distress.  CV:  Good peripheral perfusion.  Resp:  Normal effort.  Lungs are clear Abd:  No distention.  No tenderness also no CVA tenderness    ED Results / Procedures / Treatments   Labs (all labs ordered are listed, but only abnormal results are displayed) Labs Reviewed  URINALYSIS, ROUTINE W REFLEX MICROSCOPIC - Abnormal; Notable for the following components:      Result Value   Color, Urine RED (*)    APPearance TURBID (*)    Glucose, UA   (*)    Value: TEST NOT REPORTED DUE TO COLOR INTERFERENCE OF URINE PIGMENT   Hgb urine dipstick   (*)    Value: TEST NOT REPORTED DUE TO COLOR INTERFERENCE OF URINE PIGMENT   Bilirubin  Urine   (*)    Value: TEST NOT REPORTED DUE TO COLOR INTERFERENCE OF URINE PIGMENT   Ketones, ur   (*)    Value: TEST NOT REPORTED DUE TO COLOR INTERFERENCE OF URINE PIGMENT   Protein, ur   (*)    Value: TEST NOT REPORTED DUE TO COLOR INTERFERENCE OF URINE PIGMENT   Nitrite   (*)    Value: TEST NOT REPORTED DUE TO COLOR INTERFERENCE OF URINE PIGMENT   Leukocytes,Ua   (*)    Value: TEST NOT REPORTED DUE TO COLOR INTERFERENCE OF URINE PIGMENT   RBC / HPF >50 (*)    All other components within normal limits  BASIC METABOLIC PANEL - Abnormal; Notable for the following components:   Glucose, Bld 118 (*)    BUN 24 (*)    Creatinine, Ser 1.45 (*)    Calcium 8.8 (*)    GFR, Estimated 48 (*)  All other components within normal limits  URINE CULTURE  CBC     EKG     RADIOLOGY CT read by radiology reviewed and interpreted by me as well shows no obvious pathology 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  No real explanation for the hematuria.   PROCEDURES:  Critical Care performed:   Procedures   MEDICATIONS ORDERED IN ED: Medications  cephALEXin (KEFLEX) capsule 500 mg (has no administration in time range)  iohexol (OMNIPAQUE) 300 MG/ML solution 100 mL (100 mLs Intravenous Contrast Given 08/01/21 0231)     IMPRESSION / MDM / ASSESSMENT AND PLAN / ED COURSE  I reviewed the triage vital signs and the nursing notes. Discussed with patient following up with urology and his internal medicine doctor.  I will give him some antibiotics Keflex 503 times a day for the UTI.  I sent a urine culture.  Patient will return for any further problems.  Differential diagnosis includes, but is not limited to, hematuria from a UTI.  There is no obvious polyp or stone on the CT.  There is no problem with the aneurysm on the CT either.  Patient has a low GFR but this is chronic.  It is also slightly improved from the last 1.  Patient's presentation is most consistent  with acute complicated illness / injury requiring diagnostic workup.       FINAL CLINICAL IMPRESSION(S) / ED DIAGNOSES   Final diagnoses:  Hematuria, unspecified type  Urinary tract infection with hematuria, site unspecified     Rx / DC Orders   ED Discharge Orders          Ordered    cephALEXin (KEFLEX) 500 MG capsule  3 times daily        08/01/21 0404             Note:  This document was prepared using Dragon voice recognition software and may include unintentional dictation errors.   Nena Polio, MD 08/01/21 [phone]

## 2021-08-01 NOTE — Telephone Encounter (Signed)
Patient called to notify us that he was seen in the ED last night for gross hematuria. He was started on abx and urine was sent for culture. He has stopped his Eliquis per Dr. Sabra Heck until Monday. He is currently not having any blood in his urine today. He was encouraged to increase water intake. Patient has a 1 year follow up in August should this appointment be moved up or await culture results? Please advise

## 2021-08-01 NOTE — Discharge Instructions (Addendum)
It looks like the blood in your urine may be being caused by a urinary tract infection.  This happens fairly often.  I will give you some Keflex antibiotic 1 pill 3 times a day to treat the infection.  This usually works very well.  Please call your urologist [name] in the morning and let him know about the blood in your urine. If the blood in your urine stops in the morning you can restart the Eliquis.  If it is only very mild with a little bit of pinkness in the urine I think you can also restart the Eliquis.  If its still red I would call Dr. Sabra Heck or Dr. Ginette Pitman at [facility] and asked them if you should continue taking the Eliquis for now.  It is generally okay to miss a few doses if need be. Occasionally there is enough blood in the urine to clot.  If this happens sometimes the clot will block the outflow of the urine.  If you cannot urinate at all or just to have some dribbling please do not hesitate to return.  We can put a catheter in to drain the urine and then irrigate the blood out of the bladder.  The urologist would then see you within a few hours.  There is a very good chance that she will not need this to be done though.

## 2021-08-01 NOTE — ED Triage Notes (Addendum)
Pt presents via POV with complaints of hematuria that started this evening. He notes hes voiding fully - no pressure. Of note, the pt takes Eliquis but was advised by his PCP not to take the dose tonight. Denies abdominal pain, CP or Sob.

## 2021-08-02 NOTE — Telephone Encounter (Signed)
Please schedule follow-up visit with me 1-2 weeks

## 2021-08-03 LAB — URINE CULTURE: Culture: NO GROWTH

## 2021-08-05 NOTE — Telephone Encounter (Signed)
Left message for patient to call back for an appt.

## 2021-09-04 NOTE — Telephone Encounter (Signed)
Patient called back and said he didn't want to follow up for this and would just keep his appt in August.  Sharyn Lull

## 2021-09-24 ENCOUNTER — Encounter: Payer: Self-pay | Admitting: Dermatology

## 2021-09-24 ENCOUNTER — Ambulatory Visit (INDEPENDENT_AMBULATORY_CARE_PROVIDER_SITE_OTHER): Payer: Medicare Other | Admitting: Dermatology

## 2021-09-24 ENCOUNTER — Telehealth: Payer: Self-pay

## 2021-09-24 DIAGNOSIS — C44529 Squamous cell carcinoma of skin of other part of trunk: Secondary | ICD-10-CM | POA: Diagnosis not present

## 2021-09-24 DIAGNOSIS — C4492 Squamous cell carcinoma of skin, unspecified: Secondary | ICD-10-CM

## 2021-09-24 DIAGNOSIS — L820 Inflamed seborrheic keratosis: Secondary | ICD-10-CM

## 2021-09-24 MED ORDER — MUPIROCIN 2 % EX OINT: 1.0000 | TOPICAL_OINTMENT | Freq: Every day | CUTANEOUS | 0 refills | Status: AC

## 2021-09-24 NOTE — Telephone Encounter (Signed)
Patient doing fine after today's surgery./sh 

## 2021-09-24 NOTE — Progress Notes (Signed)
   Follow-Up Visit   Subjective  Joshua Frye is a 86 y.o. male who presents for the following: SCC bx proven (R chest, pt present for excision) and check spot (L face, irritated).  Patient accompanied by wife who contributes to history.  The following portions of the chart were reviewed this encounter and updated as appropriate:   Tobacco  Allergies  Meds  Problems  Med Hx  Surg Hx  Fam Hx     Review of Systems:  No other skin or systemic complaints except as noted in HPI or Assessment and Plan.  Objective  Well appearing patient in no apparent distress; mood and affect are within normal limits.  A focused examination was performed including chest. Relevant physical exam findings are noted in the Assessment and Plan.  Right Breast Pink bx site 1.2 x 0.7cm  Left deltoid, left cheek Stuck on waxy paps with erythema   Assessment & Plan  Squamous cell carcinoma of skin Right Breast  Skin excision  Lesion length (cm):  1.2 Lesion width (cm):  0.7 Margin per side (cm):  0.2 Total excision diameter (cm):  1.6 Informed consent: discussed and consent obtained   Timeout: patient name, date of birth, surgical site, and procedure verified   Procedure prep:  Patient was prepped and draped in usual sterile fashion Prep type:  Isopropyl alcohol and povidone-iodine Anesthesia: the lesion was anesthetized in a standard fashion   Anesthetic:  1% lidocaine w/ epinephrine 1-100,000 buffered w/ 8.4% NaHCO3 (6cc lido w/ epi, 3cc bupivicaine, Total of 9cc) Instrument used comment:  #15c blade Hemostasis achieved with: pressure   Hemostasis achieved with comment:  Electrocautery Outcome: patient tolerated procedure well with no complications   Post-procedure details: sterile dressing applied and wound care instructions given   Dressing type: bandage, pressure dressing and bacitracin (Mupirocin)    Skin repair Complexity:  Complex Final length (cm):  3.5 Reason for type of repair:  reduce tension to allow closure, reduce the risk of dehiscence, infection, and necrosis, reduce subcutaneous dead space and avoid a hematoma, allow closure of the large defect, preserve normal anatomy, preserve normal anatomical and functional relationships and enhance both functionality and cosmetic results   Undermining: area extensively undermined   Undermining comment:  Undermining Defect 1.1 cm Subcutaneous layers (deep stitches):  Suture size:  4-0 Suture type: Vicryl (polyglactin 910)   Subcutaneous suture technique: Inverted Dermal. Fine/surface layer approximation (top stitches):  Suture size:  4-0 Suture type: nylon   Stitches: simple running   Suture removal (days):  7 Hemostasis achieved with: pressure Outcome: patient tolerated procedure well with no complications   Post-procedure details: sterile dressing applied and wound care instructions given   Dressing type: bandage, pressure dressing and bacitracin (Mupirocin)    mupirocin ointment (BACTROBAN) 2 % Apply 1 Application topically daily. Qd to excision site  Specimen 1 - Surgical pathology Differential Diagnosis: Bx proven SCC  Check Margins: yes Pink bx site 1.2 x 0.7cm [id number]  Bx proven, excised today Start Mupirocin ont qd to excision site  Inflamed seborrheic keratosis Left deltoid, left cheek  Will treat with LN2 at post op appointment   Return in about 1 week (around 10/01/2021) for suture removal, ISK txt.  I, Othelia Pulling, RMA, am acting as scribe for Sarina Ser, MD . Documentation: I have reviewed the above documentation for accuracy and completeness, and I agree with the above.  Sarina Ser, MD

## 2021-09-24 NOTE — Patient Instructions (Signed)

## 2021-09-30 ENCOUNTER — Ambulatory Visit: Payer: Medicare Other | Admitting: Dermatology

## 2021-10-01 ENCOUNTER — Ambulatory Visit (INDEPENDENT_AMBULATORY_CARE_PROVIDER_SITE_OTHER): Payer: Medicare Other | Admitting: Dermatology

## 2021-10-01 ENCOUNTER — Encounter: Payer: Self-pay | Admitting: Dermatology

## 2021-10-01 DIAGNOSIS — Z1283 Encounter for screening for malignant neoplasm of skin: Secondary | ICD-10-CM | POA: Diagnosis not present

## 2021-10-01 DIAGNOSIS — D492 Neoplasm of unspecified behavior of bone, soft tissue, and skin: Secondary | ICD-10-CM

## 2021-10-01 DIAGNOSIS — L578 Other skin changes due to chronic exposure to nonionizing radiation: Secondary | ICD-10-CM | POA: Diagnosis not present

## 2021-10-01 DIAGNOSIS — D485 Neoplasm of uncertain behavior of skin: Secondary | ICD-10-CM | POA: Diagnosis not present

## 2021-10-01 DIAGNOSIS — D229 Melanocytic nevi, unspecified: Secondary | ICD-10-CM

## 2021-10-01 DIAGNOSIS — Z85828 Personal history of other malignant neoplasm of skin: Secondary | ICD-10-CM

## 2021-10-01 DIAGNOSIS — L820 Inflamed seborrheic keratosis: Secondary | ICD-10-CM

## 2021-10-01 DIAGNOSIS — D1800 Hemangioma unspecified site: Secondary | ICD-10-CM

## 2021-10-01 DIAGNOSIS — B353 Tinea pedis: Secondary | ICD-10-CM

## 2021-10-01 DIAGNOSIS — L821 Other seborrheic keratosis: Secondary | ICD-10-CM

## 2021-10-01 DIAGNOSIS — L814 Other melanin hyperpigmentation: Secondary | ICD-10-CM

## 2021-10-01 MED ORDER — KETOCONAZOLE 2 % EX CREA: TOPICAL_CREAM | CUTANEOUS | 6 refills | Status: AC

## 2021-10-01 NOTE — Patient Instructions (Addendum)
Wound Care Instructions  Cleanse wound gently with soap and water once a day then pat dry with clean gauze. Apply a thin coat of Petrolatum (petroleum jelly, "Vaseline") over the wound (unless you have an allergy to this). We recommend that you use a new, sterile tube of Vaseline. Do not pick or remove scabs. Do not remove the yellow or white "healing tissue" from the base of the wound.  Cover the wound with fresh, clean, nonstick gauze and secure with paper tape. You may use Band-Aids in place of gauze and tape if the wound is small enough, but would recommend trimming much of the tape off as there is often too much. Sometimes Band-Aids can irritate the skin.  You should call the office for your biopsy report after 1 week if you have not already been contacted.  If you experience any problems, such as abnormal amounts of bleeding, swelling, significant bruising, significant pain, or evidence of infection, please call the office immediately.  FOR ADULT SURGERY PATIENTS: If you need something for pain relief you may take 1 extra strength Tylenol (acetaminophen) AND 2 Ibuprofen ('200mg'$  each) together every 4 hours as needed for pain. (do not take these if you are allergic to them or if you have a reason you should not take them.) Typically, you may only need pain medication for 1 to 3 days.   FEET: Start Ketoconazole cream twice daily to feet until clear  Cryotherapy Aftercare  Wash gently with soap and water everyday.   Apply Vaseline and Band-Aid daily until healed.

## 2021-10-01 NOTE — Progress Notes (Signed)
Follow-Up Visit   Subjective  Joshua Frye is a 86 y.o. male who presents for the following: Suture / Staple Removal (Right chest. S/P SCC excision 09/24/2021) and Annual Exam (Skin cancer screening. Full body. Hx of several SCC's. Areas of concern today).  The following portions of the chart were reviewed this encounter and updated as appropriate:  Tobacco  Allergies  Meds  Problems  Med Hx  Surg Hx  Fam Hx     Review of Systems: No other skin or systemic complaints except as noted in HPI or Assessment and Plan.  Objective  Well appearing patient in no apparent distress; mood and affect are within normal limits.  A focused examination was performed including chest. Relevant physical exam findings are noted in the Assessment and Plan.  Scalp x1, left cheek x2, left thigh x1, arms and hands x6 (10) Erythematous keratotic or waxy stuck-on papule or plaque.  Left lateral Antecubital Fossa 0.6 cm pink papule     feet Scaling and maceration web spaces and over distal and lateral soles.    Assessment & Plan   History of Squamous Cell Carcinoma of the Skin - No evidence of recurrence today - No lymphadenopathy - Recommend regular full body skin exams - Recommend daily broad spectrum sunscreen SPF 30+ to sun-exposed areas, reapply every 2 hours as needed.  - Call if any new or changing lesions are noted between office visits  Lentigines - Scattered tan macules - Due to sun exposure - Benign-appearing, observe - Recommend daily broad spectrum sunscreen SPF 30+ to sun-exposed areas, reapply every 2 hours as needed. - Call for any changes  Seborrheic Keratoses - Stuck-on, waxy, tan-brown papules and/or plaques  - Benign-appearing - Discussed benign etiology and prognosis. - Observe - Call for any changes  Melanocytic Nevi - Tan-brown and/or pink-flesh-colored symmetric macules and papules - Benign appearing on exam today - Observation - Call clinic for new or  changing moles - Recommend daily use of broad spectrum spf 30+ sunscreen to sun-exposed areas.   Hemangiomas - Red papules - Discussed benign nature - Observe - Call for any changes  Actinic Damage - Chronic condition, secondary to cumulative UV/sun exposure - diffuse scaly erythematous macules with underlying dyspigmentation - Recommend daily broad spectrum sunscreen SPF 30+ to sun-exposed areas, reapply every 2 hours as needed.  - Staying in the shade or wearing long sleeves, sun glasses (UVA+UVB protection) and wide brim hats (4-inch brim around the entire circumference of the hat) are also recommended for sun protection.  - Call for new or changing lesions.  Skin cancer screening performed today.  S/P SCC excision R chest - margins clear Encounter for Removal of Sutures - Incision site at the right chest is clean, dry and intact - Wound cleansed, sutures removed, wound cleansed and steri strips applied.  - Discussed pathology results showing SCC  - Patient advised to keep steri-strips dry until they fall off. - Scars remodel for a full year. - Once steri-strips fall off, patient can apply over-the-counter silicone scar cream each night to help with scar remodeling if desired. - Patient advised to call with any concerns or if they notice any new or changing lesions.  Inflamed seborrheic keratosis (10) Scalp x1, left cheek x2, left thigh x1, arms and hands x6 Symptomatic, irritating, patient would like treated. Destruction of lesion - Scalp x1, left cheek x2, left thigh x1, arms and hands x6 Complexity: simple   Destruction method: cryotherapy   Informed consent:  discussed and consent obtained   Timeout:  patient name, date of birth, surgical site, and procedure verified Lesion destroyed using liquid nitrogen: Yes   Region frozen until ice ball extended beyond lesion: Yes   Outcome: patient tolerated procedure well with no complications   Post-procedure details: wound care  instructions given   Additional details:  Prior to procedure, discussed risks of blister formation, small wound, skin dyspigmentation, or rare scar following cryotherapy. Recommend Vaseline ointment to treated areas while healing.  Neoplasm of skin Left lateral Antecubital Fossa Epidermal / dermal shaving Lesion diameter (cm):  0.6 Informed consent: discussed and consent obtained   Timeout: patient name, date of birth, surgical site, and procedure verified   Procedure prep:  Patient was prepped and draped in usual sterile fashion Prep type:  Isopropyl alcohol Anesthesia: the lesion was anesthetized in a standard fashion   Anesthetic:  1% lidocaine w/ epinephrine 1-100,000 buffered w/ 8.4% NaHCO3 Instrument used: flexible razor blade   Hemostasis achieved with: pressure, aluminum chloride and electrodesiccation   Outcome: patient tolerated procedure well   Post-procedure details: sterile dressing applied and wound care instructions given   Dressing type: bandage and petrolatum    Specimen 1 - Surgical pathology Differential Diagnosis: Irritated nevus vs cyst, R/O dysplasia Check Margins: No  Tinea pedis of both feet feet Chronic and persistent condition with duration or expected duration over one year. Condition is symptomatic / bothersome to patient. Not to goal. Start Ketoconazole cream twice daily to feet until clear  Related Medications ketoconazole (NIZORAL) 2 % cream Apply to the feet and between toes QHS  Return for TBSE 6-12 months.  I, Emelia Salisbury, CMA, am acting as scribe for Sarina Ser, MD. Documentation: I have reviewed the above documentation for accuracy and completeness, and I agree with the above.  Sarina Ser, MD

## 2021-10-09 ENCOUNTER — Telehealth: Payer: Self-pay

## 2021-10-09 NOTE — Telephone Encounter (Signed)
Phone line picked up but no answer and then hung up.

## 2021-10-09 NOTE — Telephone Encounter (Signed)
-----   Message from Ralene Bathe, MD sent at 10/07/2021 11:02 AM EDT ----- Diagnosis Skin , left lateral antecubital fossa ATYPICAL SQUAMOUS PROLIFERATION WITH CYSTIC CHANGE, BASE INVOLVED, SEE DESCRIPTION  Consistent with Cancer = Squamous Cell Carcinoma (SCC) Schedule for treatment (EDC)

## 2021-10-14 ENCOUNTER — Telehealth: Payer: Self-pay

## 2021-10-14 NOTE — Telephone Encounter (Signed)
Patient's wife informed of pathology results and appointment scheduled.  

## 2021-10-14 NOTE — Telephone Encounter (Signed)
-----   Message from Ralene Bathe, MD sent at 10/07/2021 11:02 AM EDT ----- Diagnosis Skin , left lateral antecubital fossa ATYPICAL SQUAMOUS PROLIFERATION WITH CYSTIC CHANGE, BASE INVOLVED, SEE DESCRIPTION  Consistent with Cancer = Squamous Cell Carcinoma (SCC) Schedule for treatment (EDC)

## 2021-10-15 ENCOUNTER — Ambulatory Visit (INDEPENDENT_AMBULATORY_CARE_PROVIDER_SITE_OTHER): Payer: Medicare Other | Admitting: Dermatology

## 2021-10-15 DIAGNOSIS — L578 Other skin changes due to chronic exposure to nonionizing radiation: Secondary | ICD-10-CM | POA: Diagnosis not present

## 2021-10-15 DIAGNOSIS — L820 Inflamed seborrheic keratosis: Secondary | ICD-10-CM

## 2021-10-15 DIAGNOSIS — C44629 Squamous cell carcinoma of skin of left upper limb, including shoulder: Secondary | ICD-10-CM | POA: Diagnosis not present

## 2021-10-15 DIAGNOSIS — L821 Other seborrheic keratosis: Secondary | ICD-10-CM | POA: Diagnosis not present

## 2021-10-15 DIAGNOSIS — C4492 Squamous cell carcinoma of skin, unspecified: Secondary | ICD-10-CM

## 2021-10-15 DIAGNOSIS — L570 Actinic keratosis: Secondary | ICD-10-CM | POA: Diagnosis not present

## 2021-10-15 NOTE — Patient Instructions (Addendum)
Electrodesiccation and Curettage ("Scrape and Burn") Wound Care Instructions  Leave the original bandage on for 24 hours if possible.  If the bandage becomes soaked or soiled before that time, it is OK to remove it and examine the wound.  A small amount of post-operative bleeding is normal.  If excessive bleeding occurs, remove the bandage, place gauze over the site and apply continuous pressure (no peeking) over the area for 30 minutes. If this does not work, please call our clinic as soon as possible or page your doctor if it is after hours.   Once a day, cleanse the wound with soap and water. It is fine to shower. If a thick crust develops you may use a Q-tip dipped into dilute hydrogen peroxide (mix 1:1 with water) to dissolve it.  Hydrogen peroxide can slow the healing process, so use it only as needed.    After washing, apply petroleum jelly (Vaseline) or an antibiotic ointment if your doctor prescribed one for you, followed by a bandage.    For best healing, the wound should be covered with a layer of ointment at all times. If you are not able to keep the area covered with a bandage to hold the ointment in place, this may mean re-applying the ointment several times a day.  Continue this wound care until the wound has healed and is no longer open. It may take several weeks for the wound to heal and close.  Itching and mild discomfort is normal during the healing process.  If you have any discomfort, you can take Tylenol (acetaminophen) or ibuprofen as directed on the bottle. (Please do not take these if you have an allergy to them or cannot take them for another reason).  Some redness, tenderness and white or yellow material in the wound is normal healing.  If the area becomes very sore and red, or develops a thick yellow-green material (pus), it may be infected; please notify us.    Wound healing continues for up to one year following surgery. It is not unusual to experience pain in the scar  from time to time during the interval.  If the pain becomes severe or the scar thickens, you should notify the office.    A slight amount of redness in a scar is expected for the first six months.  After six months, the redness will fade and the scar will soften and fade.  The color difference becomes less noticeable with time.  If there are any problems, return for a post-op surgery check at your earliest convenience.  To improve the appearance of the scar, you can use silicone scar gel, cream, or sheets (such as Mederma or Serica) every night for up to one year. These are available over the counter (without a prescription).  Please call our office at [phone] for any questions or concerns.     Due to recent changes in healthcare laws, you may see results of your pathology and/or laboratory studies on MyChart before the doctors have had a chance to review them. We understand that in some cases there may be results that are confusing or concerning to you. Please understand that not all results are received at the same time and often the doctors may need to interpret multiple results in order to provide you with the best plan of care or course of treatment. Therefore, we ask that you please give us 2 business days to thoroughly review all your results before contacting the office for clarification. Should   we see a critical lab result, you will be contacted sooner.   If You Need Anything After Your Visit  If you have any questions or concerns for your doctor, please call our main line at [phone] and press option 4 to reach your doctor's medical assistant. If no one answers, please leave a voicemail as directed and we will return your call as soon as possible. Messages left after 4 pm will be answered the following business day.   You may also send us a message via MyChart. We typically respond to MyChart messages within 1-2 business days.  For prescription refills, please ask your  pharmacy to contact our office. Our fax number is [phone].  If you have an urgent issue when the clinic is closed that cannot wait until the next business day, you can page your doctor at the number below.    Please note that while we do our best to be available for urgent issues outside of office hours, we are not available 24/7.   If you have an urgent issue and are unable to reach us, you may choose to seek medical care at your doctor's office, retail clinic, urgent care center, or emergency room.  If you have a medical emergency, please immediately call 911 or go to the emergency department.  Pager Numbers  - Dr. Kowalski: [phone]  - Dr. Moye: [phone]  - Dr. Stewart: [phone]  In the event of inclement weather, please call our main line at [phone] for an update on the status of any delays or closures.  Dermatology Medication Tips: Please keep the boxes that topical medications come in in order to help keep track of the instructions about where and how to use these. Pharmacies typically print the medication instructions only on the boxes and not directly on the medication tubes.   If your medication is too expensive, please contact our office at [phone] option 4 or send us a message through MyChart.   We are unable to tell what your co-pay for medications will be in advance as this is different depending on your insurance coverage. However, we may be able to find a substitute medication at lower cost or fill out paperwork to get insurance to cover a needed medication.   If a prior authorization is required to get your medication covered by your insurance company, please allow us 1-2 business days to complete this process.  Drug prices often vary depending on where the prescription is filled and some pharmacies may offer cheaper prices.  The website www.goodrx.com contains coupons for medications through different pharmacies. The prices here do not  account for what the cost may be with help from insurance (it may be cheaper with your insurance), but the website can give you the price if you did not use any insurance.  - You can print the associated coupon and take it with your prescription to the pharmacy.  - You may also stop by our office during regular business hours and pick up a GoodRx coupon card.  - If you need your prescription sent electronically to a different pharmacy, notify our office through [facility] MyChart or by phone at [phone] option 4.     Si Usted Necesita Algo Despus de Su Visita  Tambin puede enviarnos un mensaje a travs de MyChart. Por lo general respondemos a los mensajes de MyChart en el transcurso de 1 a 2 das hbiles.  Para renovar recetas, por favor pida a su farmacia que se ponga en   contacto con nuestra oficina. Nuestro nmero de fax es el [phone].  Si tiene un asunto urgente cuando la clnica est cerrada y que no puede esperar hasta el siguiente da hbil, puede llamar/localizar a su doctor(a) al nmero que aparece a continuacin.   Por favor, tenga en cuenta que aunque hacemos todo lo posible para estar disponibles para asuntos urgentes fuera del horario de oficina, no estamos disponibles las 24 horas del da, los 7 das de la semana.   Si tiene un problema urgente y no puede comunicarse con nosotros, puede optar por buscar atencin mdica  en el consultorio de su doctor(a), en una clnica privada, en un centro de atencin urgente o en una sala de emergencias.  Si tiene una emergencia mdica, por favor llame inmediatamente al 911 o vaya a la sala de emergencias.  Nmeros de bper  - Dr. Kowalski: [phone]  - Dra. Moye: [phone]  - Dra. Stewart: [phone]  En caso de inclemencias del tiempo, por favor llame a nuestra lnea principal al [phone] para una actualizacin sobre el estado de cualquier retraso o cierre.  Consejos para la medicacin en dermatologa: Por  favor, guarde las cajas en las que vienen los medicamentos de uso tpico para ayudarle a seguir las instrucciones sobre dnde y cmo usarlos. Las farmacias generalmente imprimen las instrucciones del medicamento slo en las cajas y no directamente en los tubos del medicamento.   Si su medicamento es muy caro, por favor, pngase en contacto con nuestra oficina llamando al [phone] y presione la opcin 4 o envenos un mensaje a travs de MyChart.   No podemos decirle cul ser su copago por los medicamentos por adelantado ya que esto es diferente dependiendo de la cobertura de su seguro. Sin embargo, es posible que podamos encontrar un medicamento sustituto a menor costo o llenar un formulario para que el seguro cubra el medicamento que se considera necesario.   Si se requiere una autorizacin previa para que su compaa de seguros cubra su medicamento, por favor permtanos de 1 a 2 das hbiles para completar este proceso.  Los precios de los medicamentos varan con frecuencia dependiendo del lugar de dnde se surte la receta y alguna farmacias pueden ofrecer precios ms baratos.  El sitio web www.goodrx.com tiene cupones para medicamentos de diferentes farmacias. Los precios aqu no tienen en cuenta lo que podra costar con la ayuda del seguro (puede ser ms barato con su seguro), pero el sitio web puede darle el precio si no utiliz ningn seguro.  - Puede imprimir el cupn correspondiente y llevarlo con su receta a la farmacia.  - Tambin puede pasar por nuestra oficina durante el horario de atencin regular y recoger una tarjeta de cupones de GoodRx.  - Si necesita que su receta se enve electrnicamente a una farmacia diferente, informe a nuestra oficina a travs de MyChart de [facility] o por telfono llamando al [phone] y presione la opcin 4.  

## 2021-10-15 NOTE — Progress Notes (Signed)
Follow-Up Visit   Subjective  Joshua Frye is a 86 y.o. male who presents for the following: SCC (Bx proven of the L lat antecubital fossa - patient is here today for [facility]. He also has an itchy irritated skin lesion on his back that he would like checked today.). The patient has spots, moles and lesions to be evaluated, some may be new or changing and the patient has concerns that these could be cancer.  The following portions of the chart were reviewed this encounter and updated as appropriate:   Tobacco  Allergies  Meds  Problems  Med Hx  Surg Hx  Fam Hx     Review of Systems:  No other skin or systemic complaints except as noted in HPI or Assessment and Plan.  Objective  Well appearing patient in no apparent distress; mood and affect are within normal limits.  A focused examination was performed including the face, trunk, and extremities. Relevant physical exam findings are noted in the Assessment and Plan.  Back x 10 (10) Erythematous stuck-on, waxy papule or plaque  L lat antecubital fossa Pink biopsy site.  Face x 2 (2) Erythematous thin papules/macules with gritty scale.    Assessment & Plan  Inflamed seborrheic keratosis (10) Back x 10  Symptomatic, irritating, patient would like treated.   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  Destruction of lesion Complexity: extensive   Destruction method: electrodesiccation and curettage   Informed consent: discussed and consent obtained   Timeout:   patient name, date of birth, surgical site, and procedure verified Procedure prep:  Patient was prepped and draped in usual sterile fashion Prep type:  Isopropyl alcohol Anesthesia: the lesion was anesthetized in a standard fashion   Anesthetic:  1% lidocaine w/ epinephrine 1-100,000 buffered w/ 8.4% NaHCO3 Curettage performed in three different directions: Yes   Electrodesiccation performed over the curetted area: Yes   Lesion length (cm):  0.6 Lesion width (cm):  0.6 Margin per side (cm):  0.2 Final wound size (cm):  1 Hemostasis achieved with:  pressure, aluminum chloride and electrodesiccation Outcome: patient tolerated procedure well with no complications   Post-procedure details: sterile dressing applied and wound care instructions given   Dressing type: bandage and petrolatum    Related Medications mupirocin ointment (BACTROBAN) 2 % Apply 1 Application topically daily. Qd to excision site  AK (actinic keratosis) (2) Face x 2  Destruction of lesion - Face x 2 Complexity: simple   Destruction method: cryotherapy   Informed consent: discussed and consent obtained   Timeout:  patient name, date of birth, surgical site, and procedure verified Lesion destroyed using liquid nitrogen: Yes   Region frozen until ice ball extended beyond lesion: Yes   Outcome: patient tolerated procedure well with no complications   Post-procedure details: wound care instructions given     Actinic Damage - chronic, secondary to cumulative UV radiation exposure/sun exposure over time - diffuse scaly erythematous macules with underlying dyspigmentation - Recommend daily broad spectrum sunscreen SPF 30+ to sun-exposed areas, reapply every 2  hours as needed.  - Recommend staying in the shade or wearing long sleeves, sun glasses (UVA+UVB protection) and wide brim hats (4-inch brim around the entire circumference of the hat). - Call for new or changing lesions.  Seborrheic Keratoses - Stuck-on, waxy,  tan-brown papules and/or plaques  - Benign-appearing - Discussed benign etiology and prognosis. - Observe - Call for any changes  Return for appointment as scheduled.  Luther Redo, CMA, am acting as scribe for Sarina Ser, MD . Documentation: I have reviewed the above documentation for accuracy and completeness, and I agree with the above.  Sarina Ser, MD

## 2021-10-18 ENCOUNTER — Encounter: Payer: Self-pay | Admitting: Dermatology

## 2021-10-24 ENCOUNTER — Encounter: Payer: Self-pay | Admitting: Urology

## 2021-10-24 ENCOUNTER — Telehealth: Payer: Self-pay | Admitting: Urology

## 2021-10-24 ENCOUNTER — Ambulatory Visit (INDEPENDENT_AMBULATORY_CARE_PROVIDER_SITE_OTHER): Payer: Medicare Other | Admitting: Urology

## 2021-10-24 VITALS — BP 143/87 | HR 80 | Ht 69.0 in | Wt 160.0 lb

## 2021-10-24 DIAGNOSIS — R351 Nocturia: Secondary | ICD-10-CM | POA: Diagnosis not present

## 2021-10-24 DIAGNOSIS — N401 Enlarged prostate with lower urinary tract symptoms: Secondary | ICD-10-CM | POA: Diagnosis not present

## 2021-10-24 DIAGNOSIS — R310 Gross hematuria: Secondary | ICD-10-CM | POA: Diagnosis not present

## 2021-10-24 LAB — MICROSCOPIC EXAMINATION: Bacteria, UA: NONE SEEN

## 2021-10-24 LAB — URINALYSIS, COMPLETE
Bilirubin, UA: NEGATIVE
Glucose, UA: NEGATIVE
Ketones, UA: NEGATIVE
Leukocytes,UA: NEGATIVE
Nitrite, UA: NEGATIVE
Protein,UA: NEGATIVE
RBC, UA: NEGATIVE
Specific Gravity, UA: 1.02 (ref 1.005–1.030)
Urobilinogen, Ur: 0.2 mg/dL (ref 0.2–1.0)
pH, UA: 5 (ref 5.0–7.5)

## 2021-10-24 LAB — BLADDER SCAN AMB NON-IMAGING: Scan Result: 20

## 2021-10-24 MED ORDER — MIRABEGRON ER 50 MG PO TB24: 50.0000 mg | ORAL_TABLET | Freq: Every day | ORAL | 0 refills | Status: DC

## 2021-10-24 NOTE — Progress Notes (Signed)
10/24/2021 9:49 AM   Joshua Frye [birth year] [id number]  Referring provider: [name], MD [facility], [facility] [place],  [place] [address]  Chief Complaint  Patient presents with   Benign Prostatic Hypertrophy    Urologic history: 1.  BPH with lower urinary tract symptoms Postop urinary retention after herniorrhaphy 2017 Tamsulosin 0.8 mg  HPI: 86 y.o. male presents for annual follow-up.  Still with bothersome nocturia x3-4 At last years visit given a trial of Gemtesa which he states worked very well however his co-pay was going to run $250 per month Was placed on trospium at bedtime and indicated that it was working Today he states still with nocturia 3-4 on average and his goal would be to get down to twice a night His wife was recently started on Myrbetriq and he would be able to try this medication.  On record review he was tried Myrbetriq previously and indicated it was not effective Remains on tamsulosin IPSS today was 9/35 (5/4 for urinary frequency and nocturia)  On further record review after he left the office he was seen in the ED 08/01/2021 with 2 episodes of total gross painless hematuria.  Urinalysis was grossly bloody and urine culture was negative.  CT abdomen/pelvis with contrast was performed which showed no upper tract abnormalities and a grossly normal bladder/prostate He did not mention this episode at today's visit On Eliquis   PMH: Past Medical History:  Diagnosis Date   Actinic keratosis    Anxiety    Asthma    BPH (benign prostatic hypertrophy) 2017   Bronchiectasis (HCC)    COPD (chronic obstructive pulmonary disease) (HCC)    Coronary artery disease    Dysrhythmia    asymptomatic pvcs   GERD (gastroesophageal reflux disease)    History of gout    several years ago   HOH (hard of hearing)    Bilateral Hearing Aids   Hyperlipidemia    Hypertension    Hypothyroidism    Myocardial infarction ([place]) 1985    Presence of permanent cardiac pacemaker    Sleep apnea    OSA--C-PAP   Squamous cell carcinoma of arm, 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   L forearm    Squamous cell carcinoma of arm, left 09/10/2017   L mid dorsum forearm    Squamous cell carcinoma of arm, right 06/01/2014   R prox forearm    Squamous cell carcinoma of hand, 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 WD SCC with superficial infiltration. EDC.   Squamous cell carcinoma of skin 01/09/2020   R lower leg, EDC   Squamous cell carcinoma of skin 07/09/2020   R ear mid helix groove - ED&C    Squamous cell carcinoma of skin 06/24/2021   right chest, excised 09/24/2021   Squamous cell carcinoma of skin 10/01/2021   left lateral antecubital fossa - needs ED&C   Squamous cell carcinoma, arm, right 04/27/2013   R prox dorsum forearm  Surgical History: Past Surgical History:  Procedure Laterality Date   APPENDECTOMY     COLONOSCOPY WITH PROPOFOL N/A 05/05/2017   Procedure: COLONOSCOPY WITH PROPOFOL;  Surgeon: Manya Silvas, MD;  Location: [facility] ENDOSCOPY;  Service: Endoscopy;  Laterality: N/A;   CORONARY ANGIOPLASTY     x 2   CORONARY ARTERY BYPASS GRAFT  1985   INGUINAL HERNIA REPAIR Right 11/20/2015   Procedure: HERNIA REPAIR INGUINAL ADULT;  Surgeon: [name], MD;  Location: [facility] ORS;  Service: General;  Laterality: Right;   INSERT / REPLACE / REMOVE PACEMAKER     PACEMAKER INSERTION Left 10/15/2016   Procedure: INSERTION PACEMAKER;  Surgeon: Isaias Cowman, MD;  Location: [facility] ORS;  Service:  Cardiovascular;  Laterality: Left;   PACEMAKER LEAD REMOVAL N/A 11/19/2016   Procedure: LEAD REVISION;  Surgeon: Isaias Cowman, MD;  Location: [facility] ORS;  Service: Cardiovascular;  Laterality: N/A;   TEE WITHOUT CARDIOVERSION N/A 07/14/2019   Procedure: TRANSESOPHAGEAL ECHOCARDIOGRAM (TEE);  Surgeon: Teodoro Spray, MD;  Location: [facility] ORS;  Service: Cardiovascular;  Laterality: N/A;    Home Medications:  Allergies as of 10/24/2021       Reactions   Procardia [nifedipine] Other (See Comments)   Other reaction(s): Unknown dizzy Gets very woozy with this medication        Medication List        Accurate as of October 24, 2021  9:49 AM. If you have any questions, ask your nurse or doctor.          amLODipine 10 MG tablet Commonly known as: NORVASC Take 10 mg by mouth daily.   apixaban 5 MG Tabs tablet Commonly known as: ELIQUIS Take 5 mg by mouth 2 (two) times daily.   ascorbic acid 500 MG tablet Commonly known as: VITAMIN C Take 500 mg by mouth daily.   atorvastatin 20 MG tablet Commonly known as: LIPITOR TAKE ONE (1) TABLET BY MOUTH EVERY DAY   budesonide 180 MCG/ACT inhaler Commonly known as: PULMICORT Inhale 1 puff into the lungs daily.   fluticasone 50 MCG/ACT nasal spray Commonly known as: FLONASE instill 2 sprays into each nostril once daily   ibuprofen 200 MG tablet Commonly known as: ADVIL Take 400 mg by mouth every 8 (eight) hours as needed for headache or mild pain.   isosorbide mononitrate 30 MG 24 hr tablet Commonly known as: IMDUR Take 30 mg by mouth daily.   ketoconazole 2 % cream Commonly known as: NIZORAL Apply to the feet and between toes QHS   ketoconazole 2 % cream Commonly known as: NIZORAL Apply twice daily to feet until clear   montelukast 10 MG tablet Commonly known as: SINGULAIR Take 10 mg by mouth daily.   multivitamin capsule Take 1 capsule by mouth daily.   mupirocin ointment 2 % Commonly known as:  BACTROBAN Apply 1 Application topically daily. Qd to excision site   nitroGLYCERIN 0.4 MG SL tablet Commonly known as: NITROSTAT Place 0.4 mg under the tongue every 5 (five) minutes as needed for chest pain.   NON FORMULARY   omeprazole 20 MG capsule Commonly known as: PRILOSEC TAKE ONE (1) CAPSULE EACH DAY   tamsulosin 0.4 MG Caps capsule Commonly known as: FLOMAX TAKE 1 CAPSULE(0.4 MG) BY MOUTH TWICE DAILY   trospium 20 MG tablet Commonly known as: SANCTURA TAKE ONE TABLET BY MOUTH ONE TIME DAILY ONE HOUR PRIOR TO BEDTIME        Allergies:  Allergies  Allergen Reactions   Procardia [  Nifedipine] Other (See Comments)    Other reaction(s): Unknown dizzy Gets very woozy with this medication    Family History: Family History  Problem Relation Age of Onset   Diabetes Mother     Social History:  reports that he has never smoked. He has never used smokeless tobacco. He reports that he does not drink alcohol and does not use drugs.   Physical Exam: BP (!) 143/87   Pulse 80   Ht '5\' 9"'$  (1.753 m)   Wt 160 lb (72.6 kg)   BMI 23.63 kg/m   Constitutional:  Alert and oriented, No acute distress. HEENT: [place] AT, moist mucus membranes.  Trachea midline, no masses. Cardiovascular: No clubbing, cyanosis, or edema. Respiratory: Normal respiratory effort, no increased work of breathing. Neurologic: Grossly intact, no focal deficits, moving all 4 extremities. Psychiatric: Normal mood and affect.  Laboratory Data:  Urinalysis Dipstick/microscopy negative   Assessment & Plan:    1.  Nocturia He does have sleep apnea but states he has lost 20+ pounds and has not used since losing weight.  I again reviewed untreated sleep apnea is a common cause of nocturia and recommended he try using to see if this makes any difference He was given samples of Myrbetriq 50 mg  2.  BPH with LUTS Stable Continue tamsulosin  3.  Gross hematuria Recommend cystoscopy for lower tract  evaluation He will be contacted regarding this recommendation and scheduling   Abbie Sons, MD  [facility] [address] [place], [place] [address] [phone]

## 2021-10-24 NOTE — Telephone Encounter (Signed)
Patient was seen today for annual follow-up.  He did not mention he was seen in the ED in June for an episode of gross hematuria.  Chart was reviewed when doing note.  Recommend scheduling cystoscopy for lower tract evaluation.  CT with contrast was unremarkable

## 2021-10-29 NOTE — Telephone Encounter (Signed)
Notified patient as instructed, patient pleased. Discussed follow-up appointments, patient agrees  

## 2021-11-22 ENCOUNTER — Telehealth: Payer: Self-pay | Admitting: *Deleted

## 2021-11-22 ENCOUNTER — Other Ambulatory Visit: Payer: Self-pay | Admitting: *Deleted

## 2021-11-22 MED ORDER — MIRABEGRON ER 50 MG PO TB24: 50.0000 mg | ORAL_TABLET | Freq: Every day | ORAL | 3 refills | Status: DC

## 2021-11-22 NOTE — Telephone Encounter (Signed)
Patient needs a order in for ct scan

## 2021-11-22 NOTE — Telephone Encounter (Signed)
Error

## 2021-11-27 ENCOUNTER — Ambulatory Visit (INDEPENDENT_AMBULATORY_CARE_PROVIDER_SITE_OTHER): Payer: Medicare Other | Admitting: Dermatology

## 2021-11-27 DIAGNOSIS — D485 Neoplasm of uncertain behavior of skin: Secondary | ICD-10-CM

## 2021-11-27 DIAGNOSIS — L821 Other seborrheic keratosis: Secondary | ICD-10-CM | POA: Diagnosis not present

## 2021-11-27 DIAGNOSIS — L578 Other skin changes due to chronic exposure to nonionizing radiation: Secondary | ICD-10-CM | POA: Diagnosis not present

## 2021-11-27 DIAGNOSIS — L820 Inflamed seborrheic keratosis: Secondary | ICD-10-CM

## 2021-11-27 DIAGNOSIS — C44729 Squamous cell carcinoma of skin of left lower limb, including hip: Secondary | ICD-10-CM

## 2021-11-27 NOTE — Progress Notes (Signed)
Follow-Up Visit   Subjective  Joshua Frye is a 86 y.o. male who presents for the following: Other (Spots of left upper arm, right chest and right great toe). The patient has spots, moles and lesions to be evaluated, some may be new or changing and the patient has concerns that these could be cancer.  The following portions of the chart were reviewed this encounter and updated as appropriate:   Tobacco  Allergies  Meds  Problems  Med Hx  Surg Hx  Fam Hx     Review of Systems:  No other skin or systemic complaints except as noted in HPI or Assessment and Plan.  Objective  Well appearing patient in no apparent distress; mood and affect are within normal limits.  A focused examination was performed including upper body and legs. Relevant physical exam findings are noted in the Assessment and Plan.  Left Thigh - Anterior Translucent papule with erythema 1.1 cm  Chest, neck, arms (17) Erythematous stuck-on, waxy papule or plaque   Assessment & Plan  Neoplasm of uncertain behavior of skin Left Thigh - Anterior  Epidermal / dermal shaving  Lesion diameter (cm):  1.1 Informed consent: discussed and consent obtained   Timeout: patient name, date of birth, surgical site, and procedure verified   Procedure prep:  Patient was prepped and draped in usual sterile fashion Prep type:  Isopropyl alcohol Anesthesia: the lesion was anesthetized in a standard fashion   Anesthetic:  1% lidocaine w/ epinephrine 1-100,000 buffered w/ 8.4% NaHCO3 Instrument used: flexible razor blade   Hemostasis achieved with: pressure, aluminum chloride and electrodesiccation   Outcome: patient tolerated procedure well   Post-procedure details: sterile dressing applied and wound care instructions given   Dressing type: bandage and petrolatum    Destruction of lesion Complexity: extensive   Destruction method: electrodesiccation and curettage   Informed consent: discussed and consent obtained    Timeout:  patient name, date of birth, surgical site, and procedure verified Procedure prep:  Patient was prepped and draped in usual sterile fashion Prep type:  Isopropyl alcohol Anesthesia: the lesion was anesthetized in a standard fashion   Anesthetic:  1% lidocaine w/ epinephrine 1-100,000 buffered w/ 8.4% NaHCO3 Curettage performed in three different directions: Yes   Electrodesiccation performed over the curetted area: Yes   Lesion length (cm):  1.1 Lesion width (cm):  1.1 Margin per side (cm):  0.2 Final wound size (cm):  1.5 Hemostasis achieved with:  pressure and aluminum chloride Outcome: patient tolerated procedure well with no complications   Post-procedure details: sterile dressing applied and wound care instructions given   Dressing type: bandage and petrolatum    Specimen 1 - Surgical pathology Differential Diagnosis: Molluscum vs BCC vs other  Check Margins: No EDC today  Inflamed seborrheic keratosis (17) Chest, neck, arms  Destruction of lesion - Chest, neck, arms Complexity: simple   Destruction method: cryotherapy   Informed consent: discussed and consent obtained   Timeout:  patient name, date of birth, surgical site, and procedure verified Lesion destroyed using liquid nitrogen: Yes   Region frozen until ice ball extended beyond lesion: Yes   Outcome: patient tolerated procedure well with no complications   Post-procedure details: wound care instructions given    Actinic Damage - chronic, secondary to cumulative UV radiation exposure/sun exposure over time - diffuse scaly erythematous macules with underlying dyspigmentation - Recommend daily broad spectrum sunscreen SPF 30+ to sun-exposed areas, reapply every 2 hours as needed.  - Recommend  staying in the shade or wearing long sleeves, sun glasses (UVA+UVB protection) and wide brim hats (4-inch brim around the entire circumference of the hat). - Call for new or changing lesions.  Seborrheic  Keratoses - Stuck-on, waxy, tan-brown papules and/or plaques  - Benign-appearing - Discussed benign etiology and prognosis. - Observe - Call for any changes  Return for Follow up as scheduled.  I, Ashok Cordia, CMA, am acting as scribe for Sarina Ser, MD . Documentation: I have reviewed the above documentation for accuracy and completeness, and I agree with the above.  Sarina Ser, MD

## 2021-11-27 NOTE — Patient Instructions (Signed)
Cryotherapy Aftercare  Wash gently with soap and water everyday.   Apply Vaseline and Band-Aid daily until healed.  Wound Care Instructions  Cleanse wound gently with soap and water once a day then pat dry with clean gauze. Apply a thin coat of Petrolatum (petroleum jelly, "Vaseline") over the wound (unless you have an allergy to this). We recommend that you use a new, sterile tube of Vaseline. Do not pick or remove scabs. Do not remove the yellow or white "healing tissue" from the base of the wound.  Cover the wound with fresh, clean, nonstick gauze and secure with paper tape. You may use Band-Aids in place of gauze and tape if the wound is small enough, but would recommend trimming much of the tape off as there is often too much. Sometimes Band-Aids can irritate the skin.  You should call the office for your biopsy report after 1 week if you have not already been contacted.  If you experience any problems, such as abnormal amounts of bleeding, swelling, significant bruising, significant pain, or evidence of infection, please call the office immediately.  FOR ADULT SURGERY PATIENTS: If you need something for pain relief you may take 1 extra strength Tylenol (acetaminophen) AND 2 Ibuprofen (200mg each) together every 4 hours as needed for pain. (do not take these if you are allergic to them or if you have a reason you should not take them.) Typically, you may only need pain medication for 1 to 3 days.      Due to recent changes in healthcare laws, you may see results of your pathology and/or laboratory studies on MyChart before the doctors have had a chance to review them. We understand that in some cases there may be results that are confusing or concerning to you. Please understand that not all results are received at the same time and often the doctors may need to interpret multiple results in order to provide you with the best plan of care or course of treatment. Therefore, we ask that you  please give us 2 business days to thoroughly review all your results before contacting the office for clarification. Should we see a critical lab result, you will be contacted sooner.   If You Need Anything After Your Visit  If you have any questions or concerns for your doctor, please call our main line at [phone] and press option 4 to reach your doctor's medical assistant. If no one answers, please leave a voicemail as directed and we will return your call as soon as possible. Messages left after 4 pm will be answered the following business day.   You may also send us a message via MyChart. We typically respond to MyChart messages within 1-2 business days.  For prescription refills, please ask your pharmacy to contact our office. Our fax number is [phone].  If you have an urgent issue when the clinic is closed that cannot wait until the next business day, you can page your doctor at the number below.    Please note that while we do our best to be available for urgent issues outside of office hours, we are not available 24/7.   If you have an urgent issue and are unable to reach us, you may choose to seek medical care at your doctor's office, retail clinic, urgent care center, or emergency room.  If you have a medical emergency, please immediately call 911 or go to the emergency department.  Pager Numbers  - Dr. Kowalski: [phone]  -   Dr. Moye: [phone]  - Dr. Stewart: [phone]  In the event of inclement weather, please call our main line at [phone] for an update on the status of any delays or closures.  Dermatology Medication Tips: Please keep the boxes that topical medications come in in order to help keep track of the instructions about where and how to use these. Pharmacies typically print the medication instructions only on the boxes and not directly on the medication tubes.   If your medication is too expensive, please contact our office at  [phone] option 4 or send us a message through MyChart.   We are unable to tell what your co-pay for medications will be in advance as this is different depending on your insurance coverage. However, we may be able to find a substitute medication at lower cost or fill out paperwork to get insurance to cover a needed medication.   If a prior authorization is required to get your medication covered by your insurance company, please allow us 1-2 business days to complete this process.  Drug prices often vary depending on where the prescription is filled and some pharmacies may offer cheaper prices.  The website www.goodrx.com contains coupons for medications through different pharmacies. The prices here do not account for what the cost may be with help from insurance (it may be cheaper with your insurance), but the website can give you the price if you did not use any insurance.  - You can print the associated coupon and take it with your prescription to the pharmacy.  - You may also stop by our office during regular business hours and pick up a GoodRx coupon card.  - If you need your prescription sent electronically to a different pharmacy, notify our office through [facility] MyChart or by phone at [phone] option 4.     Si Usted Necesita Algo Despus de Su Visita  Tambin puede enviarnos un mensaje a travs de MyChart. Por lo general respondemos a los mensajes de MyChart en el transcurso de 1 a 2 das hbiles.  Para renovar recetas, por favor pida a su farmacia que se ponga en contacto con nuestra oficina. Nuestro nmero de fax es el [phone].  Si tiene un asunto urgente cuando la clnica est cerrada y que no puede esperar hasta el siguiente da hbil, puede llamar/localizar a su doctor(a) al nmero que aparece a continuacin.   Por favor, tenga en cuenta que aunque hacemos todo lo posible para estar disponibles para asuntos urgentes fuera del horario de oficina, no estamos  disponibles las 24 horas del da, los 7 das de la semana.   Si tiene un problema urgente y no puede comunicarse con nosotros, puede optar por buscar atencin mdica  en el consultorio de su doctor(a), en una clnica privada, en un centro de atencin urgente o en una sala de emergencias.  Si tiene una emergencia mdica, por favor llame inmediatamente al 911 o vaya a la sala de emergencias.  Nmeros de bper  - Dr. Kowalski: [phone]  - Dra. Moye: [phone]  - Dra. Stewart: [phone]  En caso de inclemencias del tiempo, por favor llame a nuestra lnea principal al [phone] para una actualizacin sobre el estado de cualquier retraso o cierre.  Consejos para la medicacin en dermatologa: Por favor, guarde las cajas en las que vienen los medicamentos de uso tpico para ayudarle a seguir las instrucciones sobre dnde y cmo usarlos. Las farmacias generalmente imprimen las instrucciones del medicamento slo en las cajas y   no directamente en los tubos del medicamento.   Si su medicamento es muy caro, por favor, pngase en contacto con nuestra oficina llamando al [phone] y presione la opcin 4 o envenos un mensaje a travs de MyChart.   No podemos decirle cul ser su copago por los medicamentos por adelantado ya que esto es diferente dependiendo de la cobertura de su seguro. Sin embargo, es posible que podamos encontrar un medicamento sustituto a menor costo o llenar un formulario para que el seguro cubra el medicamento que se considera necesario.   Si se requiere una autorizacin previa para que su compaa de seguros cubra su medicamento, por favor permtanos de 1 a 2 das hbiles para completar este proceso.  Los precios de los medicamentos varan con frecuencia dependiendo del lugar de dnde se surte la receta y alguna farmacias pueden ofrecer precios ms baratos.  El sitio web www.goodrx.com tiene cupones para medicamentos de diferentes farmacias. Los precios aqu no  tienen en cuenta lo que podra costar con la ayuda del seguro (puede ser ms barato con su seguro), pero el sitio web puede darle el precio si no utiliz ningn seguro.  - Puede imprimir el cupn correspondiente y llevarlo con su receta a la farmacia.  - Tambin puede pasar por nuestra oficina durante el horario de atencin regular y recoger una tarjeta de cupones de GoodRx.  - Si necesita que su receta se enve electrnicamente a una farmacia diferente, informe a nuestra oficina a travs de MyChart de [facility] o por telfono llamando al [phone] y presione la opcin 4.  

## 2021-11-29 ENCOUNTER — Ambulatory Visit (INDEPENDENT_AMBULATORY_CARE_PROVIDER_SITE_OTHER): Payer: Medicare Other | Admitting: Urology

## 2021-11-29 ENCOUNTER — Encounter: Payer: Self-pay | Admitting: Urology

## 2021-11-29 ENCOUNTER — Other Ambulatory Visit: Payer: Medicare Other | Admitting: Urology

## 2021-11-29 VITALS — BP 134/88 | HR 75 | Ht 69.0 in | Wt 165.0 lb

## 2021-11-29 DIAGNOSIS — N401 Enlarged prostate with lower urinary tract symptoms: Secondary | ICD-10-CM | POA: Diagnosis not present

## 2021-11-29 DIAGNOSIS — D291 Benign neoplasm of prostate: Secondary | ICD-10-CM

## 2021-11-29 DIAGNOSIS — R310 Gross hematuria: Secondary | ICD-10-CM

## 2021-11-29 LAB — URINALYSIS, COMPLETE
Bilirubin, UA: NEGATIVE
Glucose, UA: NEGATIVE
Ketones, UA: NEGATIVE
Leukocytes,UA: NEGATIVE
Nitrite, UA: NEGATIVE
Protein,UA: NEGATIVE
RBC, UA: NEGATIVE
Specific Gravity, UA: 1.02 (ref 1.005–1.030)
Urobilinogen, Ur: 0.2 mg/dL (ref 0.2–1.0)
pH, UA: 5.5 (ref 5.0–7.5)

## 2021-11-29 LAB — MICROSCOPIC EXAMINATION: Bacteria, UA: NONE SEEN

## 2021-11-29 NOTE — Progress Notes (Signed)
   11/29/21  CC:  Chief Complaint  Patient presents with   Cysto    HPI: Refer to my prior note 10/24/2021.  Denies recurrent gross hematuria.  Does feel Myrbetriq samples have improved his voiding symptoms  See rooming tab for vitals   Cystoscopy Procedure Note  Patient identification was confirmed, informed consent was obtained, and patient was prepped using Betadine solution.  Lidocaine jelly was administered per urethral meatus.     Pre-Procedure: - Inspection reveals a normal caliber urethral meatus.  Procedure: The flexible cystoscope was introduced without difficulty - No urethral strictures/lesions are present. - Adenoma regrowth left lateral prostate extending across midline; prominent hypervascularity - Normal bladder neck - Bilateral ureteral orifices identified - Bladder mucosa  reveals no ulcers, tumors, or lesions - No bladder stones - Mild trabeculation  Retroflexion shows open bladder neck with adenoma regrowth noted   Post-Procedure: - Patient tolerated the procedure well  Assessment/ Plan: BPH with adenoma regrowth which is most likely source of hematuria episode No bladder mucosal lesions Keep scheduled follow-up    Abbie Sons, MD

## 2021-12-02 ENCOUNTER — Encounter: Payer: Self-pay | Admitting: Dermatology

## 2021-12-03 ENCOUNTER — Telehealth: Payer: Self-pay

## 2021-12-03 NOTE — Telephone Encounter (Signed)
-----   Message from Ralene Bathe, MD sent at 11/29/2021  7:31 PM EDT ----- Diagnosis Skin , left thigh anterior SQUAMOUS CELL CARCINOMA, KERATOACANTHOMA TYPE  Cancer - SCC Already treated Recheck next visit

## 2021-12-03 NOTE — Telephone Encounter (Signed)
Advised patient biopsy on the left thigh anterior was SCC and has been treated with EDC. Will recheck at next appt.

## 2021-12-04 ENCOUNTER — Other Ambulatory Visit: Payer: Self-pay | Admitting: Urology

## 2021-12-04 DIAGNOSIS — R351 Nocturia: Secondary | ICD-10-CM

## 2021-12-04 DIAGNOSIS — Z87898 Personal history of other specified conditions: Secondary | ICD-10-CM

## 2021-12-20 ENCOUNTER — Telehealth: Payer: Self-pay | Admitting: *Deleted

## 2021-12-20 NOTE — Telephone Encounter (Signed)
Renal cysts do not require treatment.  If he has recurrent bleeding he can be started on finasteride

## 2021-12-20 NOTE — Telephone Encounter (Signed)
Daughter calling with questions about what was seen on Cysto? Pt is not remembering, the visit. Daughter wants to know what can be done about the cysts? It scares pt and daughter when they see the blood.  Assessment/ Plan: BPH with adenoma regrowth which is most likely source of hematuria episode No bladder mucosal lesions Keep scheduled follow-up

## 2021-12-24 NOTE — Telephone Encounter (Signed)
Notified patient as instructed, patient pleased. Discussed follow-up appointments, patient agrees  

## 2021-12-31 ENCOUNTER — Ambulatory Visit (INDEPENDENT_AMBULATORY_CARE_PROVIDER_SITE_OTHER): Payer: Medicare Other | Admitting: Dermatology

## 2021-12-31 DIAGNOSIS — L578 Other skin changes due to chronic exposure to nonionizing radiation: Secondary | ICD-10-CM | POA: Diagnosis not present

## 2021-12-31 DIAGNOSIS — D485 Neoplasm of uncertain behavior of skin: Secondary | ICD-10-CM

## 2021-12-31 DIAGNOSIS — C44622 Squamous cell carcinoma of skin of right upper limb, including shoulder: Secondary | ICD-10-CM

## 2021-12-31 DIAGNOSIS — C44729 Squamous cell carcinoma of skin of left lower limb, including hip: Secondary | ICD-10-CM

## 2021-12-31 NOTE — Progress Notes (Signed)
Follow-Up Visit   Subjective  Joshua Frye is a 86 y.o. male who presents for the following: Irregular skin lesion (On the L lower leg, L lat thigh, and R forearm - patient is concerned and would like them checked today). The patient has spots, moles and lesions to be evaluated, some may be new or changing and the patient has concerns that these could be cancer.  The following portions of the chart were reviewed this encounter and updated as appropriate:   Tobacco  Allergies  Meds  Problems  Med Hx  Surg Hx  Fam Hx     Review of Systems:  No other skin or systemic complaints except as noted in HPI or Assessment and Plan.  Objective  Well appearing patient in no apparent distress; mood and affect are within normal limits.  A focused examination was performed including the face and extremities. Relevant physical exam findings are noted in the Assessment and Plan.  R forearm 1.5 cm hyperkeratotic papule.  L lat thigh 1.2 cm hyperkeratotic papule.    L inf lat knee 1.2 cm    Assessment & Plan  Neoplasm of uncertain behavior of skin (3) R forearm  Epidermal / dermal shaving  Lesion diameter (cm):  1.5 Informed consent: discussed and consent obtained   Timeout: patient name, date of birth, surgical site, and procedure verified   Procedure prep:  Patient was prepped and draped in usual sterile fashion Prep type:  Isopropyl alcohol Anesthesia: the lesion was anesthetized in a standard fashion   Anesthetic:  1% lidocaine w/ epinephrine 1-100,000 buffered w/ 8.4% NaHCO3 Instrument used: flexible razor blade   Hemostasis achieved with: pressure, aluminum chloride and electrodesiccation   Outcome: patient tolerated procedure well   Post-procedure details: sterile dressing applied and wound care instructions given   Dressing type: bandage and petrolatum    Destruction of lesion Complexity: extensive   Destruction method: electrodesiccation and curettage   Informed  consent: discussed and consent obtained   Timeout:  patient name, date of birth, surgical site, and procedure verified Procedure prep:  Patient was prepped and draped in usual sterile fashion Prep type:  Isopropyl alcohol Anesthesia: the lesion was anesthetized in a standard fashion   Anesthetic:  1% lidocaine w/ epinephrine 1-100,000 buffered w/ 8.4% NaHCO3 Curettage performed in three different directions: Yes   Electrodesiccation performed over the curetted area: Yes   Lesion length (cm):  1.5 Lesion width (cm):  1.5 Margin per side (cm):  0.2 Final wound size (cm):  1.9 Hemostasis achieved with:  pressure, aluminum chloride and electrodesiccation Outcome: patient tolerated procedure well with no complications   Post-procedure details: sterile dressing applied and wound care instructions given   Dressing type: bandage and petrolatum    Specimen 1 - Surgical pathology Differential Diagnosis: D48.5 r/o SCC  ED&C today Check Margins: No  L lat thigh  Epidermal / dermal shaving  Lesion diameter (cm):  1.2 Informed consent: discussed and consent obtained   Timeout: patient name, date of birth, surgical site, and procedure verified   Procedure prep:  Patient was prepped and draped in usual sterile fashion Prep type:  Isopropyl alcohol Anesthesia: the lesion was anesthetized in a standard fashion   Anesthetic:  1% lidocaine w/ epinephrine 1-100,000 buffered w/ 8.4% NaHCO3 Instrument used: flexible razor blade   Hemostasis achieved with: pressure, aluminum chloride and electrodesiccation   Outcome: patient tolerated procedure well   Post-procedure details: sterile dressing applied and wound care instructions given  Dressing type: bandage and petrolatum    Destruction of lesion Complexity: extensive   Destruction method: electrodesiccation and curettage   Informed consent: discussed and consent obtained   Timeout:  patient name, date of birth, surgical site, and procedure  verified Procedure prep:  Patient was prepped and draped in usual sterile fashion Prep type:  Isopropyl alcohol Anesthesia: the lesion was anesthetized in a standard fashion   Anesthetic:  1% lidocaine w/ epinephrine 1-100,000 buffered w/ 8.4% NaHCO3 Curettage performed in three different directions: Yes   Electrodesiccation performed over the curetted area: Yes   Lesion length (cm):  1.2 Lesion width (cm):  1.2 Margin per side (cm):  0.2 Final wound size (cm):  1.6 Hemostasis achieved with:  pressure, aluminum chloride and electrodesiccation Outcome: patient tolerated procedure well with no complications   Post-procedure details: sterile dressing applied and wound care instructions given   Dressing type: bandage and petrolatum    Specimen 2 - Surgical pathology Differential Diagnosis: D48.5 r/o SCC ED&C Check Margins: No  L inf lat knee  Epidermal / dermal shaving  Lesion diameter (cm):  1.2 Informed consent: discussed and consent obtained   Timeout: patient name, date of birth, surgical site, and procedure verified   Procedure prep:  Patient was prepped and draped in usual sterile fashion Prep type:  Isopropyl alcohol Anesthesia: the lesion was anesthetized in a standard fashion   Anesthetic:  1% lidocaine w/ epinephrine 1-100,000 buffered w/ 8.4% NaHCO3 Instrument used: flexible razor blade   Hemostasis achieved with: pressure, aluminum chloride and electrodesiccation   Outcome: patient tolerated procedure well   Post-procedure details: sterile dressing applied and wound care instructions given   Dressing type: bandage and petrolatum    Destruction of lesion Complexity: extensive   Destruction method: electrodesiccation and curettage   Informed consent: discussed and consent obtained   Timeout:  patient name, date of birth, surgical site, and procedure verified Procedure prep:  Patient was prepped and draped in usual sterile fashion Prep type:  Isopropyl  alcohol Anesthesia: the lesion was anesthetized in a standard fashion   Anesthetic:  1% lidocaine w/ epinephrine 1-100,000 buffered w/ 8.4% NaHCO3 Curettage performed in three different directions: Yes   Electrodesiccation performed over the curetted area: Yes   Lesion length (cm):  1.2 Lesion width (cm):  1.2 Margin per side (cm):  0.2 Final wound size (cm):  1.6 Hemostasis achieved with:  pressure, aluminum chloride and electrodesiccation Outcome: patient tolerated procedure well with no complications   Post-procedure details: sterile dressing applied and wound care instructions given   Dressing type: bandage and petrolatum    Specimen 3 - Surgical pathology Differential Diagnosis: D48.5 r/o SCC ED&C today  Check Margins: No  Actinic Damage - chronic, secondary to cumulative UV radiation exposure/sun exposure over time - diffuse scaly erythematous macules with underlying dyspigmentation - Recommend daily broad spectrum sunscreen SPF 30+ to sun-exposed areas, reapply every 2 hours as needed.  - Recommend staying in the shade or wearing long sleeves, sun glasses (UVA+UVB protection) and wide brim hats (4-inch brim around the entire circumference of the hat). - Call for new or changing lesions.  Return for appointment as scheduled.  Luther Redo, CMA, am acting as scribe for Sarina Ser, MD . Documentation: I have reviewed the above documentation for accuracy and completeness, and I agree with the above.  Sarina Ser, MD

## 2021-12-31 NOTE — Patient Instructions (Signed)
Electrodesiccation and Curettage ("Scrape and Burn") Wound Care Instructions  Leave the original bandage on for 24 hours if possible.  If the bandage becomes soaked or soiled before that time, it is OK to remove it and examine the wound.  A small amount of post-operative bleeding is normal.  If excessive bleeding occurs, remove the bandage, place gauze over the site and apply continuous pressure (no peeking) over the area for 30 minutes. If this does not work, please call our clinic as soon as possible or page your doctor if it is after hours.   Once a day, cleanse the wound with soap and water. It is fine to shower. If a thick crust develops you may use a Q-tip dipped into dilute hydrogen peroxide (mix 1:1 with water) to dissolve it.  Hydrogen peroxide can slow the healing process, so use it only as needed.    After washing, apply petroleum jelly (Vaseline) or an antibiotic ointment if your doctor prescribed one for you, followed by a bandage.    For best healing, the wound should be covered with a layer of ointment at all times. If you are not able to keep the area covered with a bandage to hold the ointment in place, this may mean re-applying the ointment several times a day.  Continue this wound care until the wound has healed and is no longer open. It may take several weeks for the wound to heal and close.  Itching and mild discomfort is normal during the healing process.  If you have any discomfort, you can take Tylenol (acetaminophen) or ibuprofen as directed on the bottle. (Please do not take these if you have an allergy to them or cannot take them for another reason).  Some redness, tenderness and white or yellow material in the wound is normal healing.  If the area becomes very sore and red, or develops a thick yellow-green material (pus), it may be infected; please notify us.    Wound healing continues for up to one year following surgery. It is not unusual to experience pain in the scar  from time to time during the interval.  If the pain becomes severe or the scar thickens, you should notify the office.    A slight amount of redness in a scar is expected for the first six months.  After six months, the redness will fade and the scar will soften and fade.  The color difference becomes less noticeable with time.  If there are any problems, return for a post-op surgery check at your earliest convenience.  To improve the appearance of the scar, you can use silicone scar gel, cream, or sheets (such as Mederma or Serica) every night for up to one year. These are available over the counter (without a prescription).  Please call our office at [phone] for any questions or concerns.     Due to recent changes in healthcare laws, you may see results of your pathology and/or laboratory studies on MyChart before the doctors have had a chance to review them. We understand that in some cases there may be results that are confusing or concerning to you. Please understand that not all results are received at the same time and often the doctors may need to interpret multiple results in order to provide you with the best plan of care or course of treatment. Therefore, we ask that you please give us 2 business days to thoroughly review all your results before contacting the office for clarification. Should   we see a critical lab result, you will be contacted sooner.   If You Need Anything After Your Visit  If you have any questions or concerns for your doctor, please call our main line at [phone] and press option 4 to reach your doctor's medical assistant. If no one answers, please leave a voicemail as directed and we will return your call as soon as possible. Messages left after 4 pm will be answered the following business day.   You may also send us a message via MyChart. We typically respond to MyChart messages within 1-2 business days.  For prescription refills, please ask your  pharmacy to contact our office. Our fax number is [phone].  If you have an urgent issue when the clinic is closed that cannot wait until the next business day, you can page your doctor at the number below.    Please note that while we do our best to be available for urgent issues outside of office hours, we are not available 24/7.   If you have an urgent issue and are unable to reach us, you may choose to seek medical care at your doctor's office, retail clinic, urgent care center, or emergency room.  If you have a medical emergency, please immediately call 911 or go to the emergency department.  Pager Numbers  - Dr. Kowalski: [phone]  - Dr. Moye: [phone]  - Dr. Stewart: [phone]  In the event of inclement weather, please call our main line at [phone] for an update on the status of any delays or closures.  Dermatology Medication Tips: Please keep the boxes that topical medications come in in order to help keep track of the instructions about where and how to use these. Pharmacies typically print the medication instructions only on the boxes and not directly on the medication tubes.   If your medication is too expensive, please contact our office at [phone] option 4 or send us a message through MyChart.   We are unable to tell what your co-pay for medications will be in advance as this is different depending on your insurance coverage. However, we may be able to find a substitute medication at lower cost or fill out paperwork to get insurance to cover a needed medication.   If a prior authorization is required to get your medication covered by your insurance company, please allow us 1-2 business days to complete this process.  Drug prices often vary depending on where the prescription is filled and some pharmacies may offer cheaper prices.  The website www.goodrx.com contains coupons for medications through different pharmacies. The prices here do not  account for what the cost may be with help from insurance (it may be cheaper with your insurance), but the website can give you the price if you did not use any insurance.  - You can print the associated coupon and take it with your prescription to the pharmacy.  - You may also stop by our office during regular business hours and pick up a GoodRx coupon card.  - If you need your prescription sent electronically to a different pharmacy, notify our office through  MyChart or by phone at [phone] option 4.     Si Usted Necesita Algo Despus de Su Visita  Tambin puede enviarnos un mensaje a travs de MyChart. Por lo general respondemos a los mensajes de MyChart en el transcurso de 1 a 2 das hbiles.  Para renovar recetas, por favor pida a su farmacia que se ponga en   contacto con nuestra oficina. Nuestro nmero de fax es el [phone].  Si tiene un asunto urgente cuando la clnica est cerrada y que no puede esperar hasta el siguiente da hbil, puede llamar/localizar a su doctor(a) al nmero que aparece a continuacin.   Por favor, tenga en cuenta que aunque hacemos todo lo posible para estar disponibles para asuntos urgentes fuera del horario de oficina, no estamos disponibles las 24 horas del da, los 7 das de la semana.   Si tiene un problema urgente y no puede comunicarse con nosotros, puede optar por buscar atencin mdica  en el consultorio de su doctor(a), en una clnica privada, en un centro de atencin urgente o en una sala de emergencias.  Si tiene una emergencia mdica, por favor llame inmediatamente al 911 o vaya a la sala de emergencias.  Nmeros de bper  - Dr. Kowalski: [phone]  - Dra. Moye: [phone]  - Dra. Stewart: [phone]  En caso de inclemencias del tiempo, por favor llame a nuestra lnea principal al [phone] para una actualizacin sobre el estado de cualquier retraso o cierre.  Consejos para la medicacin en dermatologa: Por  favor, guarde las cajas en las que vienen los medicamentos de uso tpico para ayudarle a seguir las instrucciones sobre dnde y cmo usarlos. Las farmacias generalmente imprimen las instrucciones del medicamento slo en las cajas y no directamente en los tubos del medicamento.   Si su medicamento es muy caro, por favor, pngase en contacto con nuestra oficina llamando al [phone] y presione la opcin 4 o envenos un mensaje a travs de MyChart.   No podemos decirle cul ser su copago por los medicamentos por adelantado ya que esto es diferente dependiendo de la cobertura de su seguro. Sin embargo, es posible que podamos encontrar un medicamento sustituto a menor costo o llenar un formulario para que el seguro cubra el medicamento que se considera necesario.   Si se requiere una autorizacin previa para que su compaa de seguros cubra su medicamento, por favor permtanos de 1 a 2 das hbiles para completar este proceso.  Los precios de los medicamentos varan con frecuencia dependiendo del lugar de dnde se surte la receta y alguna farmacias pueden ofrecer precios ms baratos.  El sitio web www.goodrx.com tiene cupones para medicamentos de diferentes farmacias. Los precios aqu no tienen en cuenta lo que podra costar con la ayuda del seguro (puede ser ms barato con su seguro), pero el sitio web puede darle el precio si no utiliz ningn seguro.  - Puede imprimir el cupn correspondiente y llevarlo con su receta a la farmacia.  - Tambin puede pasar por nuestra oficina durante el horario de atencin regular y recoger una tarjeta de cupones de GoodRx.  - Si necesita que su receta se enve electrnicamente a una farmacia diferente, informe a nuestra oficina a travs de MyChart de [facility] o por telfono llamando al [phone] y presione la opcin 4.  

## 2022-01-07 ENCOUNTER — Telehealth: Payer: Self-pay

## 2022-01-07 NOTE — Telephone Encounter (Signed)
-----   Message from Ralene Bathe, MD sent at 01/06/2022 12:56 PM EST ----- Diagnosis 1. Skin , right forearm SQUAMOUS CELL CARCINOMA, KERATOACANTHOMA TYPE 2. 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 Skin , left inf lat knee WELL DIFFERENTIATED SQUAMOUS CELL CARCINOMA  1,2,3 - all three CANCER = SCC All 3 already treated Recheck next visit

## 2022-01-07 NOTE — Telephone Encounter (Signed)
Patient informed of pathology results 

## 2022-01-10 ENCOUNTER — Encounter: Payer: Self-pay | Admitting: Dermatology

## 2022-06-03 ENCOUNTER — Ambulatory Visit: Payer: Medicare Other | Admitting: Dermatology

## 2022-06-03 VITALS — BP 136/82 | HR 89

## 2022-06-03 DIAGNOSIS — B079 Viral wart, unspecified: Secondary | ICD-10-CM

## 2022-06-03 DIAGNOSIS — L578 Other skin changes due to chronic exposure to nonionizing radiation: Secondary | ICD-10-CM | POA: Diagnosis not present

## 2022-06-03 DIAGNOSIS — L821 Other seborrheic keratosis: Secondary | ICD-10-CM

## 2022-06-03 DIAGNOSIS — C44529 Squamous cell carcinoma of skin of other part of trunk: Secondary | ICD-10-CM | POA: Diagnosis not present

## 2022-06-03 DIAGNOSIS — D492 Neoplasm of unspecified behavior of bone, soft tissue, and skin: Secondary | ICD-10-CM

## 2022-06-03 NOTE — Progress Notes (Signed)
Follow-Up Visit   Subjective  Joshua Frye is a [age] y.o. male who presents for the following: The patient has spots on his neck, chest and arms to be evaluated, some may be new or changing and the patient has concerns that these could be cancer. Hx of SCC.   The following portions of the chart were reviewed this encounter and updated as appropriate: medications, allergies, medical history  Review of Systems:  No other skin or systemic complaints except as noted in HPI or Assessment and Plan.  Objective  Well appearing patient in no apparent distress; mood and affect are within normal limits.  A focused examination was performed of the following areas:face,arms,chest,neck    Relevant exam findings are noted in the Assessment and Plan.  right upper arm 0.5 cm firm scaly pink papule   low mid chest 0.9 cm firm pink papule            Assessment & Plan     Neoplasm of skin (2) right upper arm  Epidermal / dermal shaving  Lesion diameter (cm):  0.5 Informed consent: discussed and consent obtained   Timeout: patient name, date of birth, surgical site, and procedure verified   Procedure prep:  Patient was prepped and draped in usual sterile fashion Prep type:  Isopropyl alcohol Anesthesia: the lesion was anesthetized in a standard fashion   Anesthetic:  1% lidocaine w/ epinephrine 1-100,000 buffered w/ 8.4% NaHCO3 Hemostasis achieved with: pressure, aluminum chloride and electrodesiccation   Outcome: patient tolerated procedure well   Post-procedure details: sterile dressing applied and wound care instructions given   Dressing type: bandage and petrolatum    Destruction of lesion  Destruction method: electrodesiccation and curettage   Informed consent: discussed and consent obtained   Timeout:  patient name, date of birth, surgical site, and procedure verified Anesthesia: the lesion was anesthetized in a standard fashion   Anesthetic:  1% lidocaine w/ epinephrine  1-100,000 buffered w/ 8.4% NaHCO3 Curettage performed in three different directions: Yes   Electrodesiccation performed over the curetted area: Yes   Curettage cycles:  3 Final wound size (cm):  0.7 Hemostasis achieved with:  electrodesiccation Outcome: patient tolerated procedure well with no complications   Post-procedure details: sterile dressing applied and wound care instructions given   Dressing type: petrolatum    Specimen 1 - Surgical pathology Differential Diagnosis: R/O SCC   Check Margins: No  low mid chest  Epidermal / dermal shaving  Lesion diameter (cm):  0.9 Informed consent: discussed and consent obtained   Timeout: patient name, date of birth, surgical site, and procedure verified   Anesthesia: the lesion was anesthetized in a standard fashion   Anesthetic:  1% lidocaine w/ epinephrine 1-100,000 local infiltration Instrument used: flexible razor blade   Hemostasis achieved with: aluminum chloride   Outcome: patient tolerated procedure well   Post-procedure details: wound care instructions given   Additional details:  Mupirocin and a bandage applied  Destruction of lesion  Destruction method: electrodesiccation and curettage   Informed consent: discussed and consent obtained   Timeout:  patient name, date of birth, surgical site, and procedure verified Anesthesia: the lesion was anesthetized in a standard fashion   Anesthetic:  1% lidocaine w/ epinephrine 1-100,000 buffered w/ 8.4% NaHCO3 Curettage performed in three different directions: Yes   Electrodesiccation performed over the curetted area: Yes   Curettage cycles:  3 Final wound size (cm):  1.6 Hemostasis achieved with:  electrodesiccation Outcome: patient tolerated procedure well with  no complications   Post-procedure details: sterile dressing applied and wound care instructions given   Dressing type: petrolatum    Specimen 2 - Surgical pathology Differential Diagnosis: R/O SCC   Check Margins:  No  SEBORRHEIC KERATOSIS Posterior neck  - Stuck-on, waxy, tan-brown papules and/or plaques  - Benign-appearing - Discussed benign etiology and prognosis. - Observe - Call for any changes  ACTINIC DAMAGE - chronic, secondary to cumulative UV radiation exposure/sun exposure over time - diffuse scaly erythematous macules with underlying dyspigmentation - Recommend daily broad spectrum sunscreen SPF 30+ to sun-exposed areas, reapply every 2 hours as needed.  - Recommend staying in the shade or wearing long sleeves, sun glasses (UVA+UVB protection) and wide brim hats (4-inch brim around the entire circumference of the hat). - Call for new or changing lesions.  Keep scheduled f/u  I, Angelique HolmMonika Farrish, CMA, am acting as scribe for Darden DatesVIRGINA Cyler Kappes, MD .   Documentation: I have reviewed the above documentation for accuracy and completeness, and I agree with the above.  Darden DatesVIRGINA Genni Buske, MD

## 2022-06-03 NOTE — Patient Instructions (Addendum)
Wound Care Instructions  Cleanse wound gently with soap and water once a day then pat dry with clean gauze. Apply a thin coat of Petrolatum (petroleum jelly, "Vaseline") over the wound (unless you have an allergy to this). We recommend that you use a new, sterile tube of Vaseline. Do not pick or remove scabs. Do not remove the yellow or white "healing tissue" from the base of the wound.  Cover the wound with fresh, clean, nonstick gauze and secure with paper tape. You may use Band-Aids in place of gauze and tape if the wound is small enough, but would recommend trimming much of the tape off as there is often too much. Sometimes Band-Aids can irritate the skin.  You should call the office for your biopsy report after 1 week if you have not already been contacted.  If you experience any problems, such as abnormal amounts of bleeding, swelling, significant bruising, significant pain, or evidence of infection, please call the office immediately.  FOR ADULT SURGERY PATIENTS: If you need something for pain relief you may take 1 extra strength Tylenol (acetaminophen) AND 2 Ibuprofen (200mg each) together every 4 hours as needed for pain. (do not take these if you are allergic to them or if you have a reason you should not take them.) Typically, you may only need pain medication for 1 to 3 days.     Due to recent changes in healthcare laws, you may see results of your pathology and/or laboratory studies on MyChart before the doctors have had a chance to review them. We understand that in some cases there may be results that are confusing or concerning to you. Please understand that not all results are received at the same time and often the doctors may need to interpret multiple results in order to provide you with the best plan of care or course of treatment. Therefore, we ask that you please give us 2 business days to thoroughly review all your results before contacting the office for clarification. Should  we see a critical lab result, you will be contacted sooner.   If You Need Anything After Your Visit  If you have any questions or concerns for your doctor, please call our main line at [phone] and press option 4 to reach your doctor's medical assistant. If no one answers, please leave a voicemail as directed and we will return your call as soon as possible. Messages left after 4 pm will be answered the following business day.   You may also send us a message via MyChart. We typically respond to MyChart messages within 1-2 business days.  For prescription refills, please ask your pharmacy to contact our office. Our fax number is [phone].  If you have an urgent issue when the clinic is closed that cannot wait until the next business day, you can page your doctor at the number below.    Please note that while we do our best to be available for urgent issues outside of office hours, we are not available 24/7.   If you have an urgent issue and are unable to reach us, you may choose to seek medical care at your doctor's office, retail clinic, urgent care center, or emergency room.  If you have a medical emergency, please immediately call 911 or go to the emergency department.  Pager Numbers  - Dr. Kowalski: [phone]  - Dr. Moye: [phone]  - Dr. Stewart: [phone]  In the event of inclement weather, please call our main line at   [phone] for an update on the status of any delays or closures.  Dermatology Medication Tips: Please keep the boxes that topical medications come in in order to help keep track of the instructions about where and how to use these. Pharmacies typically print the medication instructions only on the boxes and not directly on the medication tubes.   If your medication is too expensive, please contact our office at [phone] option 4 or send us a message through MyChart.   We are unable to tell what your co-pay for medications will be in  advance as this is different depending on your insurance coverage. However, we may be able to find a substitute medication at lower cost or fill out paperwork to get insurance to cover a needed medication.   If a prior authorization is required to get your medication covered by your insurance company, please allow us 1-2 business days to complete this process.  Drug prices often vary depending on where the prescription is filled and some pharmacies may offer cheaper prices.  The website www.goodrx.com contains coupons for medications through different pharmacies. The prices here do not account for what the cost may be with help from insurance (it may be cheaper with your insurance), but the website can give you the price if you did not use any insurance.  - You can print the associated coupon and take it with your prescription to the pharmacy.  - You may also stop by our office during regular business hours and pick up a GoodRx coupon card.  - If you need your prescription sent electronically to a different pharmacy, notify our office through [facility] MyChart or by phone at [phone] option 4.     Si Usted Necesita Algo Despus de Su Visita  Tambin puede enviarnos un mensaje a travs de MyChart. Por lo general respondemos a los mensajes de MyChart en el transcurso de 1 a 2 das hbiles.  Para renovar recetas, por favor pida a su farmacia que se ponga en contacto con nuestra oficina. Nuestro nmero de fax es el [phone].  Si tiene un asunto urgente cuando la clnica est cerrada y que no puede esperar hasta el siguiente da hbil, puede llamar/localizar a su doctor(a) al nmero que aparece a continuacin.   Por favor, tenga en cuenta que aunque hacemos todo lo posible para estar disponibles para asuntos urgentes fuera del horario de oficina, no estamos disponibles las 24 horas del da, los 7 das de la semana.   Si tiene un problema urgente y no puede comunicarse con nosotros, puede  optar por buscar atencin mdica  en el consultorio de su doctor(a), en una clnica privada, en un centro de atencin urgente o en una sala de emergencias.  Si tiene una emergencia mdica, por favor llame inmediatamente al 911 o vaya a la sala de emergencias.  Nmeros de bper  - Dr. Kowalski: [phone]  - Dra. Moye: [phone]  - Dra. Stewart: [phone]  En caso de inclemencias del tiempo, por favor llame a nuestra lnea principal al [phone] para una actualizacin sobre el estado de cualquier retraso o cierre.  Consejos para la medicacin en dermatologa: Por favor, guarde las cajas en las que vienen los medicamentos de uso tpico para ayudarle a seguir las instrucciones sobre dnde y cmo usarlos. Las farmacias generalmente imprimen las instrucciones del medicamento slo en las cajas y no directamente en los tubos del medicamento.   Si su medicamento es muy caro, por favor, pngase en contacto con   nuestra oficina llamando al [phone] y presione la opcin 4 o envenos un mensaje a travs de MyChart.   No podemos decirle cul ser su copago por los medicamentos por adelantado ya que esto es diferente dependiendo de la cobertura de su seguro. Sin embargo, es posible que podamos encontrar un medicamento sustituto a menor costo o llenar un formulario para que el seguro cubra el medicamento que se considera necesario.   Si se requiere una autorizacin previa para que su compaa de seguros cubra su medicamento, por favor permtanos de 1 a 2 das hbiles para completar este proceso.  Los precios de los medicamentos varan con frecuencia dependiendo del lugar de dnde se surte la receta y alguna farmacias pueden ofrecer precios ms baratos.  El sitio web www.goodrx.com tiene cupones para medicamentos de diferentes farmacias. Los precios aqu no tienen en cuenta lo que podra costar con la ayuda del seguro (puede ser ms barato con su seguro), pero el sitio web puede darle el  precio si no utiliz ningn seguro.  - Puede imprimir el cupn correspondiente y llevarlo con su receta a la farmacia.  - Tambin puede pasar por nuestra oficina durante el horario de atencin regular y recoger una tarjeta de cupones de GoodRx.  - Si necesita que su receta se enve electrnicamente a una farmacia diferente, informe a nuestra oficina a travs de MyChart [facility] o por telfono llamando al [phone] y presione la opcin 4.  

## 2022-06-11 ENCOUNTER — Telehealth: Payer: Self-pay

## 2022-06-11 NOTE — Telephone Encounter (Signed)
Discussed pathology results. Patient voiced understanding.  

## 2022-06-11 NOTE — Telephone Encounter (Signed)
-----   Message from Sandi Mealy, MD sent at 06/10/2022 10:58 AM EDT ----- 1. 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 It is not dangerous but is contagious and can spread to other areas of skin or other people if it is not completely gone. Already treated. No additional treatment is needed.   2. Skin , low mid chest WELL DIFFERENTIATED SQUAMOUS CELL CARCINOMA --> already treated with ED&C. Monitor for recurrence. Recheck at f/u in May  MAs please call. Thank you!

## 2022-07-03 ENCOUNTER — Ambulatory Visit: Payer: Medicare Other | Admitting: Dermatology

## 2022-07-03 VITALS — BP 139/81 | HR 74

## 2022-07-03 DIAGNOSIS — Z8589 Personal history of malignant neoplasm of other organs and systems: Secondary | ICD-10-CM

## 2022-07-03 DIAGNOSIS — X32XXXA Exposure to sunlight, initial encounter: Secondary | ICD-10-CM

## 2022-07-03 DIAGNOSIS — L820 Inflamed seborrheic keratosis: Secondary | ICD-10-CM | POA: Diagnosis not present

## 2022-07-03 DIAGNOSIS — L578 Other skin changes due to chronic exposure to nonionizing radiation: Secondary | ICD-10-CM

## 2022-07-03 DIAGNOSIS — W908XXA Exposure to other nonionizing radiation, initial encounter: Secondary | ICD-10-CM | POA: Diagnosis not present

## 2022-07-03 DIAGNOSIS — L570 Actinic keratosis: Secondary | ICD-10-CM

## 2022-07-03 DIAGNOSIS — D692 Other nonthrombocytopenic purpura: Secondary | ICD-10-CM

## 2022-07-03 DIAGNOSIS — Z85828 Personal history of other malignant neoplasm of skin: Secondary | ICD-10-CM

## 2022-07-03 NOTE — Patient Instructions (Addendum)
Cryotherapy Aftercare  Wash gently with soap and water everyday.   Apply Vaseline and Band-Aid daily until healed.     Due to recent changes in healthcare laws, you may see results of your pathology and/or laboratory studies on MyChart before the doctors have had a chance to review them. We understand that in some cases there may be results that are confusing or concerning to you. Please understand that not all results are received at the same time and often the doctors may need to interpret multiple results in order to provide you with the best plan of care or course of treatment. Therefore, we ask that you please give us 2 business days to thoroughly review all your results before contacting the office for clarification. Should we see a critical lab result, you will be contacted sooner.   If You Need Anything After Your Visit  If you have any questions or concerns for your doctor, please call our main line at [phone] and press option 4 to reach your doctor's medical assistant. If no one answers, please leave a voicemail as directed and we will return your call as soon as possible. Messages left after 4 pm will be answered the following business day.   You may also send us a message via MyChart. We typically respond to MyChart messages within 1-2 business days.  For prescription refills, please ask your pharmacy to contact our office. Our fax number is [phone].  If you have an urgent issue when the clinic is closed that cannot wait until the next business day, you can page your doctor at the number below.    Please note that while we do our best to be available for urgent issues outside of office hours, we are not available 24/7.   If you have an urgent issue and are unable to reach us, you may choose to seek medical care at your doctor's office, retail clinic, urgent care center, or emergency room.  If you have a medical emergency, please immediately call 911 or go to the  emergency department.  Pager Numbers  - Dr. Kowalski: [phone]  - Dr. Moye: [phone]  - Dr. Stewart: [phone]  In the event of inclement weather, please call our main line at [phone] for an update on the status of any delays or closures.  Dermatology Medication Tips: Please keep the boxes that topical medications come in in order to help keep track of the instructions about where and how to use these. Pharmacies typically print the medication instructions only on the boxes and not directly on the medication tubes.   If your medication is too expensive, please contact our office at [phone] option 4 or send us a message through MyChart.   We are unable to tell what your co-pay for medications will be in advance as this is different depending on your insurance coverage. However, we may be able to find a substitute medication at lower cost or fill out paperwork to get insurance to cover a needed medication.   If a prior authorization is required to get your medication covered by your insurance company, please allow us 1-2 business days to complete this process.  Drug prices often vary depending on where the prescription is filled and some pharmacies may offer cheaper prices.  The website www.goodrx.com contains coupons for medications through different pharmacies. The prices here do not account for what the cost may be with help from insurance (it may be cheaper with your insurance), but the website can   give you the price if you did not use any insurance.  - You can print the associated coupon and take it with your prescription to the pharmacy.  - You may also stop by our office during regular business hours and pick up a GoodRx coupon card.  - If you need your prescription sent electronically to a different pharmacy, notify our office through [facility] MyChart or by phone at [phone] option 4.     Si Usted Necesita Algo Despus de Su Visita  Tambin puede  enviarnos un mensaje a travs de MyChart. Por lo general respondemos a los mensajes de MyChart en el transcurso de 1 a 2 das hbiles.  Para renovar recetas, por favor pida a su farmacia que se ponga en contacto con nuestra oficina. Nuestro nmero de fax es el [phone].  Si tiene un asunto urgente cuando la clnica est cerrada y que no puede esperar hasta el siguiente da hbil, puede llamar/localizar a su doctor(a) al nmero que aparece a continuacin.   Por favor, tenga en cuenta que aunque hacemos todo lo posible para estar disponibles para asuntos urgentes fuera del horario de oficina, no estamos disponibles las 24 horas del da, los 7 das de la semana.   Si tiene un problema urgente y no puede comunicarse con nosotros, puede optar por buscar atencin mdica  en el consultorio de su doctor(a), en una clnica privada, en un centro de atencin urgente o en una sala de emergencias.  Si tiene una emergencia mdica, por favor llame inmediatamente al 911 o vaya a la sala de emergencias.  Nmeros de bper  - Dr. Kowalski: [phone]  - Dra. Moye: [phone]  - Dra. Stewart: [phone]  En caso de inclemencias del tiempo, por favor llame a nuestra lnea principal al [phone] para una actualizacin sobre el estado de cualquier retraso o cierre.  Consejos para la medicacin en dermatologa: Por favor, guarde las cajas en las que vienen los medicamentos de uso tpico para ayudarle a seguir las instrucciones sobre dnde y cmo usarlos. Las farmacias generalmente imprimen las instrucciones del medicamento slo en las cajas y no directamente en los tubos del medicamento.   Si su medicamento es muy caro, por favor, pngase en contacto con nuestra oficina llamando al [phone] y presione la opcin 4 o envenos un mensaje a travs de MyChart.   No podemos decirle cul ser su copago por los medicamentos por adelantado ya que esto es diferente dependiendo de la cobertura de su seguro.  Sin embargo, es posible que podamos encontrar un medicamento sustituto a menor costo o llenar un formulario para que el seguro cubra el medicamento que se considera necesario.   Si se requiere una autorizacin previa para que su compaa de seguros cubra su medicamento, por favor permtanos de 1 a 2 das hbiles para completar este proceso.  Los precios de los medicamentos varan con frecuencia dependiendo del lugar de dnde se surte la receta y alguna farmacias pueden ofrecer precios ms baratos.  El sitio web www.goodrx.com tiene cupones para medicamentos de diferentes farmacias. Los precios aqu no tienen en cuenta lo que podra costar con la ayuda del seguro (puede ser ms barato con su seguro), pero el sitio web puede darle el precio si no utiliz ningn seguro.  - Puede imprimir el cupn correspondiente y llevarlo con su receta a la farmacia.  - Tambin puede pasar por nuestra oficina durante el horario de atencin regular y recoger una tarjeta de cupones de GoodRx.  -   Si necesita que su receta se enve electrnicamente a una farmacia diferente, informe a nuestra oficina a travs de MyChart de [place] o por telfono llamando al [phone] y presione la opcin 4.  

## 2022-07-03 NOTE — Progress Notes (Signed)
Follow-Up Visit   Subjective  Joshua Frye is a 87 y.o. male who presents for the following: The patient has spots, moles and lesions to be evaluated, some may be new or changing and the patient may have concern these could be cancer.    The following portions of the chart were reviewed this encounter and updated as appropriate: medications, allergies, medical history  Review of Systems:  No other skin or systemic complaints except as noted in HPI or Assessment and Plan.  Objective  Well appearing patient in no apparent distress; mood and affect are within normal limits.  A focused examination was performed of the following areas:face,arms,hands,neck   Relevant exam findings are noted in the Assessment and Plan.  left medial thigh x 1, posterior neck x 1 (2) Stuck-on, waxy, tan-brown papules--Discussed benign etiology and prognosis.   arms, hand x 16 (16) Erythematous thin papules/macules with gritty scale.    Assessment & Plan   Inflamed seborrheic keratosis (2) left medial thigh x 1, posterior neck x 1  Symptomatic, irritating, patient would like treated.   Destruction of lesion - left medial thigh x 1, posterior neck x 1 Complexity: simple   Destruction method: cryotherapy   Informed consent: discussed and consent obtained   Timeout:  patient name, date of birth, surgical site, and procedure verified Lesion destroyed using liquid nitrogen: Yes   Region frozen until ice ball extended beyond lesion: Yes   Outcome: patient tolerated procedure well with no complications   Post-procedure details: wound care instructions given    AK (actinic keratosis) (16) arms, hand x 16  Actinic keratoses are precancerous spots that appear secondary to cumulative UV radiation exposure/sun exposure over time. They are chronic with expected duration over 1 year. A portion of actinic keratoses will progress to squamous cell carcinoma of the skin. It is not possible to reliably predict  which spots will progress to skin cancer and so treatment is recommended to prevent development of skin cancer.  Recommend daily broad spectrum sunscreen SPF 30+ to sun-exposed areas, reapply every 2 hours as needed.  Recommend staying in the shade or wearing long sleeves, sun glasses (UVA+UVB protection) and wide brim hats (4-inch brim around the entire circumference of the hat). Call for new or changing lesions.     left index finger MCP recheck at the next office visit if not gone we may consider biopsy   Destruction of lesion - arms, hand x 16 Complexity: simple   Destruction method: cryotherapy   Informed consent: discussed and consent obtained   Timeout:  patient name, date of birth, surgical site, and procedure verified Lesion destroyed using liquid nitrogen: Yes   Region frozen until ice ball extended beyond lesion: Yes   Outcome: patient tolerated procedure well with no complications   Post-procedure details: wound care instructions given     ACTINIC DAMAGE - chronic, secondary to cumulative UV radiation exposure/sun exposure over time - diffuse scaly erythematous macules with underlying dyspigmentation - Recommend daily broad spectrum sunscreen SPF 30+ to sun-exposed areas, reapply every 2 hours as needed.  - Recommend staying in the shade or wearing long sleeves, sun glasses (UVA+UVB protection) and wide brim hats (4-inch brim around the entire circumference of the hat). - Call for new or changing lesions.   Purpura - Chronic; persistent and recurrent.  Treatable, but not curable. - Violaceous macules and patches - Benign - Related to trauma, age, sun damage and/or use of blood thinners, chronic use of topical  and/or oral steroids - Observe - Can use OTC arnica containing moisturizer such as Dermend Bruise Formula if desired - Call for worsening or other concerns    HISTORY OF SQUAMOUS CELL CARCINOMA OF THE SKIN - No evidence of recurrence today - No  lymphadenopathy - Recommend regular full body skin exams - Recommend daily broad spectrum sunscreen SPF 30+ to sun-exposed areas, reapply every 2 hours as needed.  - Call if any new or changing lesions are noted between office visits    Return in about 3 months (around 10/03/2022) for Aks .  IAngelique Holm, CMA, am acting as scribe for Armida Sans, MD .   Documentation: I have reviewed the above documentation for accuracy and completeness, and I agree with the above.  Armida Sans, MD

## 2022-07-12 ENCOUNTER — Encounter: Payer: Self-pay | Admitting: Dermatology

## 2022-10-02 ENCOUNTER — Ambulatory Visit: Payer: Medicare Other | Admitting: Dermatology

## 2022-10-02 DIAGNOSIS — L820 Inflamed seborrheic keratosis: Secondary | ICD-10-CM | POA: Diagnosis not present

## 2022-10-02 DIAGNOSIS — Z872 Personal history of diseases of the skin and subcutaneous tissue: Secondary | ICD-10-CM

## 2022-10-02 DIAGNOSIS — L578 Other skin changes due to chronic exposure to nonionizing radiation: Secondary | ICD-10-CM | POA: Diagnosis not present

## 2022-10-02 DIAGNOSIS — L570 Actinic keratosis: Secondary | ICD-10-CM

## 2022-10-02 DIAGNOSIS — D489 Neoplasm of uncertain behavior, unspecified: Secondary | ICD-10-CM

## 2022-10-02 DIAGNOSIS — W908XXA Exposure to other nonionizing radiation, initial encounter: Secondary | ICD-10-CM | POA: Diagnosis not present

## 2022-10-02 DIAGNOSIS — C44722 Squamous cell carcinoma of skin of right lower limb, including hip: Secondary | ICD-10-CM | POA: Diagnosis not present

## 2022-10-02 DIAGNOSIS — L821 Other seborrheic keratosis: Secondary | ICD-10-CM

## 2022-10-02 NOTE — Progress Notes (Signed)
Follow-Up Visit   Subjective  Joshua Frye is a 87 y.o. male who presents for the following: right inner thigh, right side of back, left side of chest  left index finger MCP recheck at the next office visit if not gone we may consider biopsy  The patient has spots, moles and lesions to be evaluated, some may be new or changing and the patient may have concern these could be cancer.  The following portions of the chart were reviewed this encounter and updated as appropriate: medications, allergies, medical history  Review of Systems:  No other skin or systemic complaints except as noted in HPI or Assessment and Plan.  Objective  Well appearing patient in no apparent distress; mood and affect are within normal limits.  A focused examination was performed of the following areas: Face, arms, hands, chest, back, legs, right thigh  Relevant exam findings are noted in the Assessment and Plan.  right nose x 1, back x  4 , left chest x 1 , right side x 5 (11) Erythematous stuck-on, waxy papule or plaque  left cheek x 1 Erythematous thin papules/macules with gritty scale.   Right Medial Thigh 1.1 cm hyperkeratotic papule         Assessment & Plan   Inflamed seborrheic keratosis (11) right nose x 1, back x  4 , left chest x 1 , right side x 5  Symptomatic, irritating, patient would like treated.  Destruction of lesion - right nose x 1, back x  4 , left chest x 1 , right side x 5 (11) Complexity: simple   Destruction method: cryotherapy   Informed consent: discussed and consent obtained   Timeout:  patient name, date of birth, surgical site, and procedure verified Lesion destroyed using liquid nitrogen: Yes   Region frozen until ice ball extended beyond lesion: Yes   Outcome: patient tolerated procedure well with no complications   Post-procedure details: wound care instructions given    Actinic keratosis left cheek x 1  Actinic keratoses are precancerous spots that  appear secondary to cumulative UV radiation exposure/sun exposure over time. They are chronic with expected duration over 1 year. A portion of actinic keratoses will progress to squamous cell carcinoma of the skin. It is not possible to reliably predict which spots will progress to skin cancer and so treatment is recommended to prevent development of skin cancer.  Recommend daily broad spectrum sunscreen SPF 30+ to sun-exposed areas, reapply every 2 hours as needed.  Recommend staying in the shade or wearing long sleeves, sun glasses (UVA+UVB protection) and wide brim hats (4-inch brim around the entire circumference of the hat). Call for new or changing lesions.  Destruction of lesion - left cheek x 1 Complexity: simple   Destruction method: cryotherapy   Informed consent: discussed and consent obtained   Timeout:  patient name, date of birth, surgical site, and procedure verified Lesion destroyed using liquid nitrogen: Yes   Region frozen until ice ball extended beyond lesion: Yes   Outcome: patient tolerated procedure well with no complications   Post-procedure details: wound care instructions given    Neoplasm of uncertain behavior Right Medial Thigh  Epidermal / dermal shaving  Lesion diameter (cm):  1.1 Informed consent: discussed and consent obtained   Timeout: patient name, date of birth, surgical site, and procedure verified   Procedure prep:  Patient was prepped and draped in usual sterile fashion Prep type:  Isopropyl alcohol Anesthesia: the lesion was anesthetized in  a standard fashion   Anesthetic:  1% lidocaine w/ epinephrine 1-100,000 buffered w/ 8.4% NaHCO3 Instrument used: flexible razor blade   Hemostasis achieved with: pressure, aluminum chloride and electrodesiccation   Outcome: patient tolerated procedure well   Post-procedure details: sterile dressing applied and wound care instructions given   Dressing type: bandage and petrolatum    Destruction of  lesion Complexity: extensive   Destruction method: electrodesiccation and curettage   Informed consent: discussed and consent obtained   Timeout:  patient name, date of birth, surgical site, and procedure verified Procedure prep:  Patient was prepped and draped in usual sterile fashion Prep type:  Isopropyl alcohol Anesthesia: the lesion was anesthetized in a standard fashion   Anesthetic:  1% lidocaine w/ epinephrine 1-100,000 buffered w/ 8.4% NaHCO3 Curettage performed in three different directions: Yes   Electrodesiccation performed over the curetted area: Yes   Lesion length (cm):  1.1 Lesion width (cm):  1.1 Margin per side (cm):  0.2 Final wound size (cm):  1.5 Hemostasis achieved with:  pressure, aluminum chloride and electrodesiccation Outcome: patient tolerated procedure well with no complications   Post-procedure details: sterile dressing applied and wound care instructions given   Dressing type: bandage and petrolatum    Specimen 1 - Surgical pathology Differential Diagnosis: r/o scc vs isk   Check Margins: No  R/o scc vs isk   SEBORRHEIC KERATOSIS - Stuck-on, waxy, tan-brown papules and/or plaques  - Benign-appearing - Discussed benign etiology and prognosis. - Observe - Call for any changes  ACTINIC DAMAGE - chronic, secondary to cumulative UV radiation exposure/sun exposure over time - diffuse scaly erythematous macules with underlying dyspigmentation - Recommend daily broad spectrum sunscreen SPF 30+ to sun-exposed areas, reapply every 2 hours as needed.  - Recommend staying in the shade or wearing long sleeves, sun glasses (UVA+UVB protection) and wide brim hats (4-inch brim around the entire circumference of the hat). - Call for new or changing lesions.   HISTORY OF PRECANCEROUS ACTINIC KERATOSIS Left index finger MCP is clear today  - site(s) of PreCancerous Actinic Keratosis clear today. - these may recur and new lesions may form requiring treatment to  prevent transformation into skin cancer - observe for new or changing spots and contact [facility] for appointment if occur - photoprotection with sun protective clothing; sunglasses and broad spectrum sunscreen with SPF of at least 30 + and frequent self skin exams recommended - yearly exams by a dermatologist recommended for persons with history of PreCancerous Actinic Keratoses  Return for 6 - 8 monthak / isk followup.  IAsher Muir, CMA, am acting as scribe for Armida Sans, MD.  Documentation: I have reviewed the above documentation for accuracy and completeness, and I agree with the above.  Armida Sans, MD

## 2022-10-02 NOTE — Patient Instructions (Addendum)
Biopsy Wound Care Instructions  Leave the original bandage on for 24 hours if possible.  If the bandage becomes soaked or soiled before that time, it is OK to remove it and examine the wound.  A small amount of post-operative bleeding is normal.  If excessive bleeding occurs, remove the bandage, place gauze over the site and apply continuous pressure (no peeking) over the area for 30 minutes. If this does not work, please call our clinic as soon as possible or page your doctor if it is after hours.   Once a day, cleanse the wound with soap and water. It is fine to shower. If a thick crust develops you may use a Q-tip dipped into dilute hydrogen peroxide (mix 1:1 with water) to dissolve it.  Hydrogen peroxide can slow the healing process, so use it only as needed.    After washing, apply petroleum jelly (Vaseline) or an antibiotic ointment if your doctor prescribed one for you, followed by a bandage.    For best healing, the wound should be covered with a layer of ointment at all times. If you are not able to keep the area covered with a bandage to hold the ointment in place, this may mean re-applying the ointment several times a day.  Continue this wound care until the wound has healed and is no longer open.   Itching and mild discomfort is normal during the healing process. However, if you develop pain or severe itching, please call our office.   If you have any discomfort, you can take Tylenol (acetaminophen) or ibuprofen as directed on the bottle. (Please do not take these if you have an allergy to them or cannot take them for another reason).  Some redness, tenderness and white or yellow material in the wound is normal healing.  If the area becomes very sore and red, or develops a thick yellow-green material (pus), it may be infected; please notify us.    If you have stitches, return to clinic as directed to have the stitches removed. You will continue wound care for 2-3 days after the stitches  are removed.   Wound healing continues for up to one year following surgery. It is not unusual to experience pain in the scar from time to time during the interval.  If the pain becomes severe or the scar thickens, you should notify the office.    A slight amount of redness in a scar is expected for the first six months.  After six months, the redness will fade and the scar will soften and fade.  The color difference becomes less noticeable with time.  If there are any problems, return for a post-op surgery check at your earliest convenience.  To improve the appearance of the scar, you can use silicone scar gel, cream, or sheets (such as Mederma or Serica) every night for up to one year. These are available over the counter (without a prescription).  Please call our office at [phone] for any questions or concerns.    Electrodesiccation and Curettage ("Scrape and Burn") Wound Care Instructions  Leave the original bandage on for 24 hours if possible.  If the bandage becomes soaked or soiled before that time, it is OK to remove it and examine the wound.  A small amount of post-operative bleeding is normal.  If excessive bleeding occurs, remove the bandage, place gauze over the site and apply continuous pressure (no peeking) over the area for 30 minutes. If this does not work, please call  our clinic as soon as possible or page your doctor if it is after hours.   Once a day, cleanse the wound with soap and water. It is fine to shower. If a thick crust develops you may use a Q-tip dipped into dilute hydrogen peroxide (mix 1:1 with water) to dissolve it.  Hydrogen peroxide can slow the healing process, so use it only as needed.    After washing, apply petroleum jelly (Vaseline) or an antibiotic ointment if your doctor prescribed one for you, followed by a bandage.    For best healing, the wound should be covered with a layer of ointment at all times. If you are not able to keep the area covered  with a bandage to hold the ointment in place, this may mean re-applying the ointment several times a day.  Continue this wound care until the wound has healed and is no longer open. It may take several weeks for the wound to heal and close.  Itching and mild discomfort is normal during the healing process.  If you have any discomfort, you can take Tylenol (acetaminophen) or ibuprofen as directed on the bottle. (Please do not take these if you have an allergy to them or cannot take them for another reason).  Some redness, tenderness and white or yellow material in the wound is normal healing.  If the area becomes very sore and red, or develops a thick yellow-green material (pus), it may be infected; please notify us.    Wound healing continues for up to one year following surgery. It is not unusual to experience pain in the scar from time to time during the interval.  If the pain becomes severe or the scar thickens, you should notify the office.    A slight amount of redness in a scar is expected for the first six months.  After six months, the redness will fade and the scar will soften and fade.  The color difference becomes less noticeable with time.  If there are any problems, return for a post-op surgery check at your earliest convenience.  To improve the appearance of the scar, you can use silicone scar gel, cream, or sheets (such as Mederma or Serica) every night for up to one year. These are available over the counter (without a prescription).  Please call our office at [phone] for any questions or concerns.  Seborrheic Keratosis  What causes seborrheic keratoses? Seborrheic keratoses are harmless, common skin growths that first appear during adult life.  As time goes by, more growths appear.  Some people may develop a large number of them.  Seborrheic keratoses appear on both covered and uncovered body parts.  They are not caused by sunlight.  The tendency to develop seborrheic  keratoses can be inherited.  They vary in color from skin-colored to gray, brown, or even black.  They can be either smooth or have a rough, warty surface.   Seborrheic keratoses are superficial and look as if they were stuck on the skin.  Under the microscope this type of keratosis looks like layers upon layers of skin.  That is why at times the top layer may seem to fall off, but the rest of the growth remains and re-grows.    Treatment Seborrheic keratoses do not need to be treated, but can easily be removed in the office.  Seborrheic keratoses often cause symptoms when they rub on clothing or jewelry.  Lesions can be in the way of shaving.  If they become  inflamed, they can cause itching, soreness, or burning.  Removal of a seborrheic keratosis can be accomplished by freezing, burning, or surgery. If any spot bleeds, scabs, or grows rapidly, please return to have it checked, as these can be an indication of a skin cancer.  Cryotherapy Aftercare  Wash gently with soap and water everyday.   Apply Vaseline and Band-Aid daily until healed.     Actinic keratoses are precancerous spots that appear secondary to cumulative UV radiation exposure/sun exposure over time. They are chronic with expected duration over 1 year. A portion of actinic keratoses will progress to squamous cell carcinoma of the skin. It is not possible to reliably predict which spots will progress to skin cancer and so treatment is recommended to prevent development of skin cancer.  Recommend daily broad spectrum sunscreen SPF 30+ to sun-exposed areas, reapply every 2 hours as needed.  Recommend staying in the shade or wearing long sleeves, sun glasses (UVA+UVB protection) and wide brim hats (4-inch brim around the entire circumference of the hat). Call for new or changing lesions.      Due to recent changes in healthcare laws, you may see results of your pathology and/or laboratory studies on MyChart before the doctors have  had a chance to review them. We understand that in some cases there may be results that are confusing or concerning to you. Please understand that not all results are received at the same time and often the doctors may need to interpret multiple results in order to provide you with the best plan of care or course of treatment. Therefore, we ask that you please give [place] 2 business days to thoroughly review all your results before contacting the office for clarification. Should we see a critical lab result, you will be contacted sooner.   If You Need Anything After Your Visit  If you have any questions or concerns for your doctor, please call our main line at [phone] and press option 4 to reach your doctor's medical assistant. If no one answers, please leave a voicemail as directed and we will return your call as soon as possible. Messages left after 4 pm will be answered the following business day.   You may also send [place] a message via MyChart. We typically respond to MyChart messages within 1-2 business days.  For prescription refills, please ask your pharmacy to contact our office. Our fax number is [phone].  If you have an urgent issue when the clinic is closed that cannot wait until the next business day, you can page your doctor at the number below.    Please note that while we do our best to be available for urgent issues outside of office hours, we are not available 24/7.   If you have an urgent issue and are unable to reach [place], you may choose to seek medical care at your doctor's office, retail clinic, urgent care center, or emergency room.  If you have a medical emergency, please immediately call 911 or go to the emergency department.  Pager Numbers  - Dr. Gwen Pounds: [phone]  - Dr. Roseanne Reno: [phone]  In the event of inclement weather, please call our main line at [phone] for an update on the status of any delays or closures.  Dermatology Medication  Tips: Please keep the boxes that topical medications come in in order to help keep track of the instructions about where and how to use these. Pharmacies typically print the medication instructions only on the boxes and not  directly on the medication tubes.   If your medication is too expensive, please contact our office at [phone] option 4 or send [place] a message through MyChart.   We are unable to tell what your co-pay for medications will be in advance as this is different depending on your insurance coverage. However, we may be able to find a substitute medication at lower cost or fill out paperwork to get insurance to cover a needed medication.   If a prior authorization is required to get your medication covered by your insurance company, please allow [place] 1-2 business days to complete this process.  Drug prices often vary depending on where the prescription is filled and some pharmacies may offer cheaper prices.  The website www.goodrx.com contains coupons for medications through different pharmacies. The prices here do not account for what the cost may be with help from insurance (it may be cheaper with your insurance), but the website can give you the price if you did not use any insurance.  - You can print the associated coupon and take it with your prescription to the pharmacy.  - You may also stop by our office during regular business hours and pick up a GoodRx coupon card.  - If you need your prescription sent electronically to a different pharmacy, notify our office through [facility] or by phone at [phone] option 4.     Si Usted Necesita Algo Despus de Su Visita  Tambin puede enviarnos un mensaje a travs de Clinical cytogeneticist. Por lo general respondemos a los mensajes de MyChart en el transcurso de 1 a 2 das hbiles.  Para renovar recetas, por favor pida a su farmacia que se ponga en contacto con nuestra oficina. Annie Sable de fax es [place] [phone].  Si tiene un  asunto urgente cuando la clnica est cerrada y que no puede esperar hasta el siguiente da hbil, puede llamar/localizar a su doctor(a) al nmero que aparece a continuacin.   Por favor, tenga en cuenta que aunque hacemos todo lo posible para estar disponibles para asuntos urgentes fuera del horario de [place], no estamos disponibles las 24 horas del da, los 7 [address]  [address] de la [place].   Si tiene un problema urgente y no puede comunicarse con nosotros, puede optar por buscar atencin mdica  en el consultorio de su doctor(a), en una clnica privada, en un centro de atencin urgente o en una sala de emergencias.  Si tiene Engineer, drilling, por favor llame inmediatamente al 911 o vaya a la sala de emergencias.  Nmeros de bper  - Dr. Gwen Pounds: [phone]  - Dra. Roseanne Reno: [phone]  En caso de inclemencias del [place], por favor llame a Lacy Duverney principal al [phone] para una actualizacin sobre el [place] de cualquier retraso o cierre.  Consejos para la medicacin en dermatologa: Por favor, guarde las cajas en las que vienen los medicamentos de uso tpico para ayudarle a seguir las instrucciones sobre dnde y cmo usarlos. Las farmacias generalmente imprimen las instrucciones del medicamento slo en las cajas y no directamente en los tubos del [place].   Si su medicamento es muy caro, por favor, pngase en contacto con Rolm Gala llamando al [phone] y presione la opcin 4 o envenos un mensaje a travs de Clinical cytogeneticist.   No podemos decirle cul ser su copago por los medicamentos por adelantado ya que esto es diferente dependiendo de la cobertura de su seguro. Sin embargo, es posible que podamos encontrar un medicamento sustituto a Audiological scientist  un formulario para que el seguro cubra el medicamento que se considera necesario.   Si se requiere una autorizacin previa para que su compaa de seguros [place] su medicamento, por favor permtanos de 1 a 2 das hbiles para  completar [address]  Los precios de los medicamentos varan con frecuencia dependiendo del Environmental consultant de dnde se surte la receta y alguna farmacias pueden ofrecer precios ms baratos.  El sitio web www.goodrx.com tiene cupones para medicamentos de Health and safety inspector. Los precios aqu no tienen en cuenta lo que podra costar con la ayuda del seguro (puede ser ms barato con su seguro), pero el sitio web puede darle el precio si no utiliz Tourist information centre manager.  - Puede imprimir el cupn correspondiente y llevarlo con su receta a la farmacia.  - Tambin puede pasar por nuestra oficina durante el horario de atencin regular y Education officer, museum una tarjeta de cupones de GoodRx.  - Si necesita que su receta se enve electrnicamente a una farmacia diferente, informe a nuestra oficina a travs de MyChart de [place] o por telfono llamando al [phone] y presione la opcin 4.

## 2022-10-09 ENCOUNTER — Telehealth: Payer: Self-pay

## 2022-10-09 NOTE — Telephone Encounter (Signed)
Patient informed of pathology results 

## 2022-10-09 NOTE — Telephone Encounter (Signed)
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

## 2022-10-13 ENCOUNTER — Encounter: Payer: Self-pay | Admitting: Dermatology

## 2022-10-24 ENCOUNTER — Ambulatory Visit: Payer: Medicare Other | Admitting: Urology

## 2022-11-19 ENCOUNTER — Ambulatory Visit: Payer: Medicare Other | Admitting: Urology

## 2022-11-19 ENCOUNTER — Encounter: Payer: Self-pay | Admitting: Urology

## 2022-11-19 VITALS — BP 154/89 | HR 73 | Ht 69.0 in | Wt 165.0 lb

## 2022-11-19 DIAGNOSIS — N401 Enlarged prostate with lower urinary tract symptoms: Secondary | ICD-10-CM | POA: Diagnosis not present

## 2022-11-19 LAB — MICROSCOPIC EXAMINATION

## 2022-11-19 LAB — URINALYSIS, COMPLETE
Bilirubin, UA: NEGATIVE
Glucose, UA: NEGATIVE
Ketones, UA: NEGATIVE
Leukocytes,UA: NEGATIVE
Nitrite, UA: NEGATIVE
Protein,UA: NEGATIVE
RBC, UA: NEGATIVE
Specific Gravity, UA: 1.025 (ref 1.005–1.030)
Urobilinogen, Ur: 0.2 mg/dL (ref 0.2–1.0)
pH, UA: 5.5 (ref 5.0–7.5)

## 2022-11-19 LAB — BLADDER SCAN AMB NON-IMAGING: PVR: 14 WU

## 2022-11-19 NOTE — Progress Notes (Signed)
Joshua Frye,acting as a scribe for Riki Altes, MD., have documented all relevant documentation on the behalf of Riki Altes, MD, as directed by  Riki Altes, MD while in the presence of Riki Altes, MD.  11/19/2022 2:32 PM   Joshua Frye April 26, 1935 [id number]  Referring provider: Danella Penton, MD [phone] [facility] [address] [facility] [place],  [place] [address]  Chief Complaint  Patient presents with   Follow-up    Urologic history:  1.  BPH with lower urinary tract symptoms Postop urinary retention after herniorrhaphy 2017 Tamsulosin 0.8 mg PVP 2006  2. Gross hematuria CT adenopelvis with contrast June 2023, showed no upper tract abnormalities. Cystoscopy 11/29/21 with adenoma regrowth left lateral prostate extending across midline and with prominent hypervascularity.   HPI: 87 y.o. male presents for annual follow-up.  At last year's visit, he was complaining of bothersome nocturia from 4-5 x per night and had seen no improvement with Myrbetriq, Gemtesa, Oxybutinin, and Tropsium. He is only taking Tamsulosin 0.4 mg twice daily and most recently, his nocturia has decreased to only 2x per night. Currently satisfied with his voiding pattern and IPSS 2/35. Denies recurrent gross hematuria.   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   L forearm    Squamous cell  carcinoma of arm, left 09/10/2017   L mid dorsum forearm    Squamous cell carcinoma of arm, right 06/01/2014   R prox forearm    Squamous cell carcinoma of hand, 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 WD SCC with superficial infiltration. EDC.   Squamous cell carcinoma of skin 01/09/2020   R lower leg, EDC   Squamous cell carcinoma of skin 07/09/2020   R ear mid helix groove - ED&C    Squamous cell carcinoma of skin 06/24/2021   right chest, excised 09/24/2021   Squamous cell carcinoma of skin 10/01/2021   left lateral antecubital fossa - needs ED&C   Squamous cell carcinoma of skin 11/27/2021   L ant thigh, EDC   Squamous cell carcinoma of skin 12/31/2021   R forearm - ED&C   Squamous cell carcinoma of skin 12/31/2021   L lat thigh - ED&C   Squamous cell carcinoma of skin  12/31/2021   L inf lat knee - ED&C   Squamous cell carcinoma of skin 06/03/2022   Low mid chest. WD SCC. EDC   Squamous cell carcinoma of skin 10/02/2022   R med thigh, ED&C   Squamous cell carcinoma, arm, right 04/27/2013   R prox dorsum forearm     Surgical History: Past Surgical History:  Procedure Laterality Date   APPENDECTOMY     COLONOSCOPY WITH PROPOFOL N/A 05/05/2017   Procedure: COLONOSCOPY WITH PROPOFOL;  Surgeon: Scot Jun, MD;  [facility] ENDOSCOPY;  Service: Endoscopy;  Laterality: N/A;   CORONARY ANGIOPLASTY     x 2   CORONARY ARTERY BYPASS GRAFT  1985   INGUINAL HERNIA REPAIR Right 11/20/2015   Procedure: HERNIA REPAIR INGUINAL ADULT;  Surgeon: Nadeen Landau, MD;  Location: [facility] ORS;  Service: General;  Laterality: Right;   INSERT / REPLACE / REMOVE PACEMAKER     PACEMAKER INSERTION Left 10/15/2016   Procedure: INSERTION PACEMAKER;  Surgeon: Marcina Millard, MD;  Location: [facility] ORS;  Service: Cardiovascular;  Laterality: Left;   PACEMAKER LEAD REMOVAL N/A 11/19/2016   Procedure: LEAD REVISION;  Surgeon: Marcina Millard, MD;  Location: [facility] ORS;  Service: Cardiovascular;  Laterality: N/A;   TEE WITHOUT CARDIOVERSION N/A 07/14/2019   Procedure: TRANSESOPHAGEAL ECHOCARDIOGRAM (TEE);  Surgeon: Dalia Heading, MD;  Location: [facility] ORS;  Service: Cardiovascular;  Laterality: N/A;    Home Medications:  Allergies as of 11/19/2022       Reactions   Procardia [nifedipine] Other (See Comments)   Other reaction(s): Unknown dizzy Gets very woozy with this medication        Medication List        Accurate as of November 19, 2022  2:32 PM. If you have any questions, ask your nurse or doctor.          amLODipine 10 MG tablet Commonly known as: NORVASC Take 10 mg by mouth daily.   apixaban 5 MG Tabs tablet Commonly known as: ELIQUIS Take 5 mg by mouth 2 (two) times daily.   ascorbic acid 500 MG tablet Commonly known as: VITAMIN C Take 500 mg by mouth daily.   atorvastatin 20 MG tablet Commonly known as: LIPITOR TAKE ONE (1) TABLET BY MOUTH EVERY DAY   budesonide 180 MCG/ACT inhaler Commonly known as: PULMICORT Inhale 1 puff into the lungs daily.   fluticasone 50 MCG/ACT nasal spray Commonly known as: FLONASE instill 2 sprays into each nostril once daily   ibuprofen 200 MG tablet Commonly known as: ADVIL Take 400 mg by mouth every 8 (eight) hours as needed for headache or mild pain.   isosorbide mononitrate 30 MG 24 hr tablet Commonly known as: IMDUR Take 30 mg by mouth daily.   ketoconazole 2 % cream Commonly known as: NIZORAL Apply to the feet and between toes QHS   ketoconazole 2 % cream Commonly known  as: NIZORAL Apply twice daily to feet until clear   mirabegron ER 50 MG Tb24 tablet Commonly known as: MYRBETRIQ Take 1 tablet (50 mg total) by mouth daily.   montelukast 10 MG tablet Commonly known as: SINGULAIR Take 10 mg by mouth daily.   multivitamin capsule Take 1 capsule by mouth daily.   mupirocin ointment 2 % Commonly known as: BACTROBAN Apply 1 Application topically daily. Qd to excision site   nitroGLYCERIN 0.4 MG SL tablet Commonly known as: NITROSTAT Place 0.4 mg under the tongue every 5 (five) minutes as needed for chest pain.  NON FORMULARY   omeprazole 20 MG capsule Commonly known as: PRILOSEC TAKE ONE (1) CAPSULE EACH DAY   tamsulosin 0.4 MG Caps capsule Commonly known as: FLOMAX TAKE 1 CAPSULE(0.4 MG) BY MOUTH TWICE DAILY        Allergies:  Allergies  Allergen Reactions   Procardia [Nifedipine] Other (See Comments)    Other reaction(s): Unknown dizzy Gets very woozy with this medication    Family History: Family History  Problem Relation Age of Onset   Diabetes Mother     Social History:  reports that he has never smoked. He has never used smokeless tobacco. He reports that he does not drink alcohol and does not use drugs.   Physical Exam: BP (!) 154/89   Pulse 73   Ht 5\' 9"  (1.753 m)   Wt 165 lb (74.8 kg)   BMI 24.37 kg/m   Constitutional:  Alert and oriented, No acute distress. HEENT: [place] AT, moist mucus membranes.  Trachea midline, no masses. Cardiovascular: No clubbing, cyanosis, or edema. Respiratory: Normal respiratory effort, no increased work of breathing. Neurologic: Grossly intact, no focal deficits, moving all 4 extremities. Psychiatric: Normal mood and affect.   Assessment & Plan:    1.  BPH with LUTS Significant improvement in his nocturia Continue tamsulosin PVR today 14 mL Continue annual follow up   I have reviewed the above documentation for accuracy and completeness, and I agree with the above.   Riki Altes, MD  [facility] [address] [place], [place] [address] [phone]

## 2022-12-10 ENCOUNTER — Other Ambulatory Visit: Payer: Self-pay | Admitting: Urology

## 2022-12-10 DIAGNOSIS — R351 Nocturia: Secondary | ICD-10-CM

## 2022-12-10 DIAGNOSIS — Z87898 Personal history of other specified conditions: Secondary | ICD-10-CM

## 2022-12-22 ENCOUNTER — Telehealth: Payer: Self-pay

## 2022-12-22 ENCOUNTER — Telehealth: Payer: Self-pay | Admitting: Urology

## 2022-12-22 NOTE — Telephone Encounter (Signed)
Pt stopped by office.  He had blood in his urine from Saturday morning, all day, then nothing on Sunday.  He wants to know if this is normal.  No pain, fever, nothing else.

## 2022-12-22 NOTE — Telephone Encounter (Signed)
Message was sent to Dr. Lonna Cobb

## 2022-12-22 NOTE — Telephone Encounter (Signed)
Pt LM on triage line stating that he had 1 episode of gross hematuria on Saturday. No hematuria since. Denies other symptoms but states he wanted to make you aware in case follow up is needed. Please advise.

## 2022-12-23 NOTE — Telephone Encounter (Signed)
Notified patient as instructed, patient states he will hold off for the cysto. He just wanted to make sure.

## 2022-12-23 NOTE — Telephone Encounter (Signed)
Based on cystoscopy last October consistent with BPH and Eliquis.  No further evaluation needed for an isolated episode.  If he has recurrent episodes recommend repeat cystoscopy

## 2023-02-21 ENCOUNTER — Other Ambulatory Visit: Payer: Self-pay | Admitting: Urology

## 2023-04-22 ENCOUNTER — Ambulatory Visit: Payer: Medicare Other | Admitting: Dermatology

## 2023-04-22 ENCOUNTER — Encounter: Payer: Self-pay | Admitting: Dermatology

## 2023-04-22 DIAGNOSIS — B351 Tinea unguium: Secondary | ICD-10-CM

## 2023-04-22 DIAGNOSIS — D1801 Hemangioma of skin and subcutaneous tissue: Secondary | ICD-10-CM

## 2023-04-22 DIAGNOSIS — L570 Actinic keratosis: Secondary | ICD-10-CM | POA: Diagnosis not present

## 2023-04-22 DIAGNOSIS — Z1283 Encounter for screening for malignant neoplasm of skin: Secondary | ICD-10-CM | POA: Diagnosis not present

## 2023-04-22 DIAGNOSIS — D229 Melanocytic nevi, unspecified: Secondary | ICD-10-CM

## 2023-04-22 DIAGNOSIS — L814 Other melanin hyperpigmentation: Secondary | ICD-10-CM

## 2023-04-22 DIAGNOSIS — W908XXA Exposure to other nonionizing radiation, initial encounter: Secondary | ICD-10-CM | POA: Diagnosis not present

## 2023-04-22 DIAGNOSIS — Z79899 Other long term (current) drug therapy: Secondary | ICD-10-CM

## 2023-04-22 DIAGNOSIS — L603 Nail dystrophy: Secondary | ICD-10-CM

## 2023-04-22 DIAGNOSIS — L578 Other skin changes due to chronic exposure to nonionizing radiation: Secondary | ICD-10-CM

## 2023-04-22 DIAGNOSIS — L821 Other seborrheic keratosis: Secondary | ICD-10-CM

## 2023-04-22 DIAGNOSIS — L820 Inflamed seborrheic keratosis: Secondary | ICD-10-CM | POA: Diagnosis not present

## 2023-04-22 DIAGNOSIS — Z7189 Other specified counseling: Secondary | ICD-10-CM

## 2023-04-22 DIAGNOSIS — B353 Tinea pedis: Secondary | ICD-10-CM

## 2023-04-22 DIAGNOSIS — Z8589 Personal history of malignant neoplasm of other organs and systems: Secondary | ICD-10-CM

## 2023-04-22 MED ORDER — TERBINAFINE HCL 250 MG PO TABS: 250.0000 mg | ORAL_TABLET | Freq: Every day | ORAL | 0 refills | Status: AC

## 2023-04-22 NOTE — Progress Notes (Signed)
 Follow-Up Visit   Subjective  Joshua Frye is a 88 y.o. male who presents for the following: Skin Cancer Screening and Full Body Skin Exam Hx of isks, hx of aks, hx of scc multiple locations. Hx of bx proven scc at right medial thigh treated with [facility] in August 2024 will recheck today.   Patient reports some itchy spots at back  The patient presents for Total-Body Skin Exam (TBSE) for skin cancer screening and mole check. The patient has spots, moles and lesions to be evaluated, some may be new or changing and the patient may have concern these could be cancer.  The following portions of the chart were reviewed this encounter and updated as appropriate: medications, allergies, medical history  Review of Systems:  No other skin or systemic complaints except as noted in HPI or Assessment and Plan.  Objective  Well appearing patient in no apparent distress; mood and affect are within normal limits.  A full examination was performed including scalp, head, eyes, ears, nose, lips, neck, chest, axillae, abdomen, back, buttocks, bilateral upper extremities, bilateral lower extremities, hands, feet, fingers, toes, fingernails, and toenails. All findings within normal limits unless otherwise noted below.   Relevant physical exam findings are noted in the Assessment and Plan.  back x 20 , b/l  arms x 5 (25) Erythematous stuck-on, waxy papule or plaque right ear x 1, b/l hands at 15 (16) Erythematous thin papules/macules with gritty scale.   Assessment & Plan   SKIN CANCER SCREENING PERFORMED TODAY.  ACTINIC DAMAGE - Chronic condition, secondary to cumulative UV/sun exposure - diffuse scaly erythematous macules with underlying dyspigmentation - Recommend daily broad spectrum sunscreen SPF 30+ to sun-exposed areas, reapply every 2 hours as needed.  - Staying in the shade or wearing long sleeves, sun glasses (UVA+UVB protection) and wide brim hats (4-inch brim around the entire circumference  of the hat) are also recommended for sun protection.  - Call for new or changing lesions.  LENTIGINES, SEBORRHEIC KERATOSES, HEMANGIOMAS - Benign normal skin lesions - Benign-appearing - Call for any changes  MELANOCYTIC NEVI - Tan-brown and/or pink-flesh-colored symmetric macules and papules - Benign appearing on exam today - Observation - Call clinic for new or changing moles - Recommend daily use of broad spectrum spf 30+ sunscreen to sun-exposed areas.   ONYCHOMYCOSIS with Tinea Pedis and Nail Dystrophy of b/l toenails and feet  Exam: Thickened toenails with subungal debris c/w onychomycosis, with scale at feet and nail dystrophy  Chronic and persistent condition with duration or expected duration over one year. Condition is symptomatic/ bothersome to patient. Not currently at goal. Treatment Plan: Discussed cream vs pill treatment   Reviewed [facility] Labs from 02/04/2023  Liver and kidney normal   Start Terbinafine 250 mg tab - take 1 tab po qd for 1 month. Patient instructed to contact us through mychart in 1 month if doing well on treatment, will prescribed 2 additional refills.   Terbinafine Counseling  Terbinafine is an anti-fungal medicine that can be applied to the skin (over the counter) or taken by mouth (prescription) to treat fungal infections. The pill version is often used to treat fungal infections of the nails or scalp. While most people do not have any side effects from taking terbinafine pills, some possible side effects of the medicine can include taste changes, headache, loss of smell, vision changes, nausea, vomiting, or diarrhea.   Rare side effects can include irritation of the liver, allergic reaction, or decrease in blood  counts (which may show up as not feeling well or developing an infection). If you are concerned about any of these side effects, please stop the medicine and call your doctor, or in the case of an emergency such as feeling very unwell, seek  immediate medical care.   HISTORY OF SQUAMOUS CELL CARCINOMA OF THE SKIN - right medial thigh ED&C done 10/02/2022  Other locations see history  - No evidence of recurrence today - No lymphadenopathy - Recommend regular full body skin exams - Recommend daily broad spectrum sunscreen SPF 30+ to sun-exposed areas, reapply every 2 hours as needed.  - Call if any new or changing lesions are noted between office visits  TINEA UNGUIUM   Related Medications terbinafine (LAMISIL) 250 MG tablet Take 1 tablet (250 mg total) by mouth daily. INFLAMED SEBORRHEIC KERATOSIS (25) back x 20 , b/l  arms x 5 (25) Symptomatic, irritating, patient would like treated. Destruction of lesion - back x 20 , b/l  arms x 5 (25) Complexity: simple   Destruction method: cryotherapy   Informed consent: discussed and consent obtained   Timeout:  patient name, date of birth, surgical site, and procedure verified Lesion destroyed using liquid nitrogen: Yes   Region frozen until ice ball extended beyond lesion: Yes   Outcome: patient tolerated procedure well with no complications   Post-procedure details: wound care instructions given   ACTINIC KERATOSIS (16) right ear x 1, b/l hands at 15 (16) Actinic keratoses are precancerous spots that appear secondary to cumulative UV radiation exposure/sun exposure over time. They are chronic with expected duration over 1 year. A portion of actinic keratoses will progress to squamous cell carcinoma of the skin. It is not possible to reliably predict which spots will progress to skin cancer and so treatment is recommended to prevent development of skin cancer.  Recommend daily broad spectrum sunscreen SPF 30+ to sun-exposed areas, reapply every 2 hours as needed.  Recommend staying in the shade or wearing long sleeves, sun glasses (UVA+UVB protection) and wide brim hats (4-inch brim around the entire circumference of the hat). Call for new or changing lesions. Return for August  appt ak and tinea follow up.  IAsher Muir, CMA, am acting as scribe for Armida Sans, MD.   Documentation: I have reviewed the above documentation for accuracy and completeness, and I agree with the above.  Armida Sans, MD

## 2023-04-22 NOTE — Patient Instructions (Addendum)
 Terbinafine Counseling  Terbinafine is an anti-fungal medicine that can be applied to the skin (over the counter) or taken by mouth (prescription) to treat fungal infections. The pill version is often used to treat fungal infections of the nails or scalp. While most people do not have any side effects from taking terbinafine pills, some possible side effects of the medicine can include taste changes, headache, loss of smell, vision changes, nausea, vomiting, or diarrhea.   Rare side effects can include irritation of the liver, allergic reaction, or decrease in blood counts (which may show up as not feeling well or developing an infection). If you are concerned about any of these side effects, please stop the medicine and call your doctor, or in the case of an emergency such as feeling very unwell, seek immediate medical care.   Seborrheic Keratosis  What causes seborrheic keratoses? Seborrheic keratoses are harmless, common skin growths that first appear during adult life.  As time goes by, more growths appear.  Some people may develop a large number of them.  Seborrheic keratoses appear on both covered and uncovered body parts.  They are not caused by sunlight.  The tendency to develop seborrheic keratoses can be inherited.  They vary in color from skin-colored to gray, brown, or even black.  They can be either smooth or have a rough, warty surface.   Seborrheic keratoses are superficial and look as if they were stuck on the skin.  Under the microscope this type of keratosis looks like layers upon layers of skin.  That is why at times the top layer may seem to fall off, but the rest of the growth remains and re-grows.    Treatment Seborrheic keratoses do not need to be treated, but can easily be removed in the office.  Seborrheic keratoses often cause symptoms when they rub on clothing or jewelry.  Lesions can be in the way of shaving.  If they become inflamed, they can cause itching,  soreness, or burning.  Removal of a seborrheic keratosis can be accomplished by freezing, burning, or surgery. If any spot bleeds, scabs, or grows rapidly, please return to have it checked, as these can be an indication of a skin cancer.  Cryotherapy Aftercare  Wash gently with soap and water everyday.   Apply Vaseline and Band-Aid daily until healed.      Due to recent changes in healthcare laws, you may see results of your pathology and/or laboratory studies on MyChart before the doctors have had a chance to review them. We understand that in some cases there may be results that are confusing or concerning to you. Please understand that not all results are received at the same time and often the doctors may need to interpret multiple results in order to provide you with the best plan of care or course of treatment. Therefore, we ask that you please give [place] 2 business days to thoroughly review all your results before contacting the office for clarification. Should we see a critical lab result, you will be contacted sooner.   If You Need Anything After Your Visit  If you have any questions or concerns for your doctor, please call our main line at [phone] and press option 4 to reach your doctor's medical assistant. If no one answers, please leave a voicemail as directed and we will return your call as soon as possible. Messages left after 4 pm will be answered the following business day.   You may  also send [place] a message via MyChart. We typically respond to MyChart messages within 1-2 business days.  For prescription refills, please ask your pharmacy to contact our office. Our fax number is [phone].  If you have an urgent issue when the clinic is closed that cannot wait until the next business day, you can page your doctor at the number below.    Please note that while we do our best to be available for urgent issues outside of office hours, we are not available 24/7.   If you have  an urgent issue and are unable to reach [place], you may choose to seek medical care at your doctor's office, retail clinic, urgent care center, or emergency room.  If you have a medical emergency, please immediately call 911 or go to the emergency department.  Pager Numbers  - Dr. Gwen Pounds: [phone]  - Dr. Roseanne Reno: [phone]  - Dr. Katrinka Blazing: [phone]   In the event of inclement weather, please call our main line at [phone] for an update on the status of any delays or closures.  Dermatology Medication Tips: Please keep the boxes that topical medications come in in order to help keep track of the instructions about where and how to use these. Pharmacies typically print the medication instructions only on the boxes and not directly on the medication tubes.   If your medication is too expensive, please contact our office at [phone] option 4 or send [place] a message through MyChart.   We are unable to tell what your co-pay for medications will be in advance as this is different depending on your insurance coverage. However, we may be able to find a substitute medication at lower cost or fill out paperwork to get insurance to cover a needed medication.   If a prior authorization is required to get your medication covered by your insurance company, please allow [place] 1-2 business days to complete this process.  Drug prices often vary depending on where the prescription is filled and some pharmacies may offer cheaper prices.  The website www.goodrx.com contains coupons for medications through different pharmacies. The prices here do not account for what the cost may be with help from insurance (it may be cheaper with your insurance), but the website can give you the price if you did not use any insurance.  - You can print the associated coupon and take it with your prescription to the pharmacy.  - You may also stop by our office during regular business hours and pick up a GoodRx coupon  card.  - If you need your prescription sent electronically to a different pharmacy, notify our office through [facility] or by phone at [phone] option 4.     Si Usted Necesita Algo Despus de Su Visita  Tambin puede enviarnos un mensaje a travs de Clinical cytogeneticist. Por lo general respondemos a los mensajes de MyChart en el transcurso de 1 a 2 das hbiles.  Para renovar recetas, por favor pida a su farmacia que se ponga en contacto con nuestra oficina. Annie Sable de fax es [place] [phone].  Si tiene un asunto urgente cuando la clnica est cerrada y que no puede esperar hasta el siguiente da hbil, puede llamar/localizar a su doctor(a) al nmero que aparece a continuacin.   Por favor, tenga en cuenta que aunque hacemos todo lo posible para estar disponibles para asuntos urgentes fuera del horario de [place], no estamos disponibles las 24 horas del da, los 7 [address]  [address] de la [place].  Si tiene un problema urgente y no puede comunicarse con nosotros, puede optar por buscar atencin mdica  en el consultorio de su doctor(a), en una clnica privada, en un centro de atencin urgente o en una sala de emergencias.  Si tiene Engineer, drilling, por favor llame inmediatamente al 911 o vaya a la sala de emergencias.  Nmeros de bper  - Dr. Gwen Pounds: [phone]  - Dra. Roseanne Reno: [phone]  - Dr. Katrinka Blazing: [phone]   En caso de inclemencias del tiempo, por favor llame a Lacy Duverney principal al [phone] para una actualizacin sobre el [place] de cualquier retraso o cierre.  Consejos para la medicacin en dermatologa: Por favor, guarde las cajas en las que vienen los medicamentos de uso tpico para ayudarle a seguir las instrucciones sobre dnde y cmo usarlos. Las farmacias generalmente imprimen las instrucciones del medicamento slo en las cajas y no directamente en los tubos del [place].   Si su medicamento es muy caro, por favor, pngase en contacto con Rolm Gala llamando al [phone] y presione la opcin 4 o envenos un mensaje a travs de Clinical cytogeneticist.   No podemos decirle cul ser su copago por los medicamentos por adelantado ya que esto es diferente dependiendo de la cobertura de su seguro. Sin embargo, es posible que podamos encontrar un medicamento sustituto a Audiological scientist un formulario para que el seguro cubra el medicamento que se considera necesario.   Si se requiere una autorizacin previa para que su compaa de seguros [place] su medicamento, por favor permtanos de 1 a 2 das hbiles para completar [address]  Los precios de los medicamentos varan con frecuencia dependiendo del Environmental consultant de dnde se surte la receta y alguna farmacias pueden ofrecer precios ms baratos.  El sitio web www.goodrx.com tiene cupones para medicamentos de Health and safety inspector. Los precios aqu no tienen en cuenta lo que podra costar con la ayuda del seguro (puede ser ms barato con su seguro), pero el sitio web puede darle el precio si no utiliz Tourist information centre manager.  - Puede imprimir el cupn correspondiente y llevarlo con su receta a la farmacia.  - Tambin puede pasar por nuestra oficina durante el horario de atencin regular y Education officer, museum una tarjeta de cupones de GoodRx.  - Si necesita que su receta se enve electrnicamente a una farmacia diferente, informe a nuestra oficina a travs de MyChart de [place] o por telfono llamando al [phone] y presione la opcin 4.

## 2023-06-24 ENCOUNTER — Ambulatory Visit: Admitting: Dermatology

## 2023-06-24 DIAGNOSIS — L578 Other skin changes due to chronic exposure to nonionizing radiation: Secondary | ICD-10-CM

## 2023-06-24 DIAGNOSIS — C44722 Squamous cell carcinoma of skin of right lower limb, including hip: Secondary | ICD-10-CM

## 2023-06-24 DIAGNOSIS — D492 Neoplasm of unspecified behavior of bone, soft tissue, and skin: Secondary | ICD-10-CM

## 2023-06-24 DIAGNOSIS — Z85828 Personal history of other malignant neoplasm of skin: Secondary | ICD-10-CM | POA: Diagnosis not present

## 2023-06-24 DIAGNOSIS — W908XXA Exposure to other nonionizing radiation, initial encounter: Secondary | ICD-10-CM

## 2023-06-24 DIAGNOSIS — Z8589 Personal history of malignant neoplasm of other organs and systems: Secondary | ICD-10-CM

## 2023-06-24 DIAGNOSIS — C4492 Squamous cell carcinoma of skin, unspecified: Secondary | ICD-10-CM

## 2023-06-24 DIAGNOSIS — C44729 Squamous cell carcinoma of skin of left lower limb, including hip: Secondary | ICD-10-CM | POA: Diagnosis not present

## 2023-06-24 DIAGNOSIS — D489 Neoplasm of uncertain behavior, unspecified: Secondary | ICD-10-CM

## 2023-06-24 HISTORY — DX: Squamous cell carcinoma of skin, unspecified: C44.92

## 2023-06-24 NOTE — Progress Notes (Unsigned)
 Follow-Up Visit   Subjective  Joshua Frye is a 88 y.o. male who presents for the following: spot a left lower leg  and spot at right lower leg he would like checked today.  The patient has spots, moles and lesions to be evaluated, some may be new or changing and the patient may have concern these could be cancer.  The following portions of the chart were reviewed this encounter and updated as appropriate: medications, allergies, medical history  Review of Systems:  No other skin or systemic complaints except as noted in HPI or Assessment and Plan.  Objective  Well appearing patient in no apparent distress; mood and affect are within normal limits.   A focused examination was performed of the following areas: B/l lower legs  Relevant exam findings are noted in the Assessment and Plan.  right anterior lateral ankle 2.2 cm hyperkeratotic papule   left lateral inferior popliteal 1.2 cm hyperkeratotic papule   Assessment & Plan   NEOPLASM OF UNCERTAIN BEHAVIOR (2) right anterior lateral ankle Epidermal / dermal shaving  Lesion diameter (cm):  2.2 Informed consent: discussed and consent obtained   Timeout: patient name, date of birth, surgical site, and procedure verified   Procedure prep:  Patient was prepped and draped in usual sterile fashion Prep type:  Isopropyl alcohol Anesthesia: the lesion was anesthetized in a standard fashion   Anesthetic:  1% lidocaine  w/ epinephrine  1-100,000 buffered w/ 8.4% NaHCO3 Instrument used: flexible razor blade   Hemostasis achieved with: pressure, aluminum chloride and electrodesiccation   Outcome: patient tolerated procedure well   Post-procedure details: sterile dressing applied and wound care instructions given   Dressing type: bandage and petrolatum    Destruction of lesion Complexity: extensive   Destruction method: electrodesiccation and curettage   Informed consent: discussed and consent obtained   Timeout:  patient name,  date of birth, surgical site, and procedure verified Procedure prep:  Patient was prepped and draped in usual sterile fashion Prep type:  Isopropyl alcohol Anesthesia: the lesion was anesthetized in a standard fashion   Anesthetic:  1% lidocaine  w/ epinephrine  1-100,000 buffered w/ 8.4% NaHCO3 Curettage performed in three different directions: Yes   Electrodesiccation performed over the curetted area: Yes   Lesion length (cm):  2.2 Lesion width (cm):  2.2 Margin per side (cm):  0.2 Final wound size (cm):  2.6 Hemostasis achieved with:  pressure, aluminum chloride and electrodesiccation Outcome: patient tolerated procedure well with no complications   Post-procedure details: sterile dressing applied and wound care instructions given   Dressing type: bandage and petrolatum   Specimen 1 - Surgical pathology Differential Diagnosis: r/o scc ED&C done today   Check Margins: No left lateral inferior popliteal Epidermal / dermal shaving  Lesion diameter (cm):  1.2 Informed consent: discussed and consent obtained   Timeout: patient name, date of birth, surgical site, and procedure verified   Procedure prep:  Patient was prepped and draped in usual sterile fashion Prep type:  Isopropyl alcohol Anesthesia: the lesion was anesthetized in a standard fashion   Anesthetic:  1% lidocaine  w/ epinephrine  1-100,000 buffered w/ 8.4% NaHCO3 Instrument used: flexible razor blade   Hemostasis achieved with: pressure, aluminum chloride and electrodesiccation   Outcome: patient tolerated procedure well   Post-procedure details: sterile dressing applied and wound care instructions given   Dressing type: bandage and petrolatum    Destruction of lesion Complexity: extensive   Destruction method: electrodesiccation and curettage   Informed consent: discussed and consent  obtained   Timeout:  patient name, date of birth, surgical site, and procedure verified Procedure prep:  Patient was prepped and draped  in usual sterile fashion Prep type:  Isopropyl alcohol Anesthesia: the lesion was anesthetized in a standard fashion   Anesthetic:  1% lidocaine  w/ epinephrine  1-100,000 buffered w/ 8.4% NaHCO3 Curettage performed in three different directions: Yes   Electrodesiccation performed over the curetted area: Yes   Lesion length (cm):  1.2 Lesion width (cm):  1.2 Margin per side (cm):  0.2 Final wound size (cm):  1.6 Hemostasis achieved with:  pressure, aluminum chloride and electrodesiccation Outcome: patient tolerated procedure well with no complications   Post-procedure details: sterile dressing applied and wound care instructions given   Dressing type: bandage and petrolatum   Specimen 2 - Surgical pathology Differential Diagnosis: r/o scc ED&C done  Check Margins: No R/o scc  ACTINIC SKIN DAMAGE   HISTORY OF SQUAMOUS CELL CARCINOMA    ACTINIC DAMAGE - chronic, secondary to cumulative UV radiation exposure/sun exposure over time - diffuse scaly erythematous macules with underlying dyspigmentation - Recommend daily broad spectrum sunscreen SPF 30+ to sun-exposed areas, reapply every 2 hours as needed.  - Recommend staying in the shade or wearing long sleeves, sun glasses (UVA+UVB protection) and wide brim hats (4-inch brim around the entire circumference of the hat). - Call for new or changing lesions.  HISTORY OF SQUAMOUS CELL CARCINOMA OF THE SKIN - No evidence of recurrence today - No lymphadenopathy - Recommend regular full body skin exams - Recommend daily broad spectrum sunscreen SPF 30+ to sun-exposed areas, reapply every 2 hours as needed.  - Call if any new or changing lesions are noted between office visits  Return for keep follow up as schedule in august .  I, Randee Busing, CMA, am acting as scribe for Celine Collard, MD.   Documentation: I have reviewed the above documentation for accuracy and completeness, and I agree with the above.  Celine Collard,  MD

## 2023-06-24 NOTE — Patient Instructions (Signed)
Electrodesiccation and Curettage (“Scrape and Burn”) Wound Care Instructions ° °Leave the original bandage on for 24 hours if possible.  If the bandage becomes soaked or soiled before that time, it is OK to remove it and examine the wound.  A small amount of post-operative bleeding is normal.  If excessive bleeding occurs, remove the bandage, place gauze over the site and apply continuous pressure (no peeking) over the area for 30 minutes. If this does not work, please call our clinic as soon as possible or page your doctor if it is after hours.  ° °Once a day, cleanse the wound with soap and water. It is fine to shower. If a thick crust develops you may use a Q-tip dipped into dilute hydrogen peroxide (mix 1:1 with water) to dissolve it.  Hydrogen peroxide can slow the healing process, so use it only as needed.   ° °After washing, apply petroleum jelly (Vaseline) or an antibiotic ointment if your doctor prescribed one for you, followed by a bandage.   ° °For best healing, the wound should be covered with a layer of ointment at all times. If you are not able to keep the area covered with a bandage to hold the ointment in place, this may mean re-applying the ointment several times a day.  Continue this wound care until the wound has healed and is no longer open. It may take several weeks for the wound to heal and close. ° °Itching and mild discomfort is normal during the healing process. ° °If you have any discomfort, you can take Tylenol (acetaminophen) or ibuprofen as directed on the bottle. (Please do not take these if you have an allergy to them or cannot take them for another reason). ° °Some redness, tenderness and white or yellow material in the wound is normal healing.  If the area becomes very sore and red, or develops a thick yellow-green material (pus), it may be infected; please notify us.   ° °Wound healing continues for up to one year following surgery. It is not unusual to experience pain in the scar  from time to time during the interval.  If the pain becomes severe or the scar thickens, you should notify the office.   ° °A slight amount of redness in a scar is expected for the first six months.  After six months, the redness will fade and the scar will soften and fade.  The color difference becomes less noticeable with time.  If there are any problems, return for a post-op surgery check at your earliest convenience. ° °To improve the appearance of the scar, you can use silicone scar gel, cream, or sheets (such as Mederma or Serica) every night for up to one year. These are available over the counter (without a prescription). ° °Please call our office at [phone] for any questions or concerns. °

## 2023-06-25 ENCOUNTER — Encounter: Payer: Self-pay | Admitting: Dermatology

## 2023-07-01 LAB — SURGICAL PATHOLOGY

## 2023-07-02 ENCOUNTER — Telehealth: Payer: Self-pay

## 2023-07-02 NOTE — Telephone Encounter (Addendum)
 Called and discuss bx results with patient. He verbalized understanding and denied further questions. Will recheck at next follow up  ----- Message from Celine Collard sent at 07/02/2023 12:38 PM EDT ----- FINAL DIAGNOSIS        1. Skin, right anterior lateral ankle :       WELL DIFFERENTIATED SQUAMOUS CELL CARCINOMA        2. Skin, left lateral inferior popliteal :       WELL DIFFERENTIATED SQUAMOUS CELL CARCINOMA    1&2 - Both Cancer = SCC Both already treated Recheck next visit

## 2023-07-27 ENCOUNTER — Encounter: Payer: Self-pay | Admitting: Dermatology

## 2023-07-27 ENCOUNTER — Ambulatory Visit: Admitting: Dermatology

## 2023-07-27 DIAGNOSIS — W908XXA Exposure to other nonionizing radiation, initial encounter: Secondary | ICD-10-CM | POA: Diagnosis not present

## 2023-07-27 DIAGNOSIS — D492 Neoplasm of unspecified behavior of bone, soft tissue, and skin: Secondary | ICD-10-CM

## 2023-07-27 DIAGNOSIS — C44729 Squamous cell carcinoma of skin of left lower limb, including hip: Secondary | ICD-10-CM

## 2023-07-27 DIAGNOSIS — C4492 Squamous cell carcinoma of skin, unspecified: Secondary | ICD-10-CM

## 2023-07-27 DIAGNOSIS — L578 Other skin changes due to chronic exposure to nonionizing radiation: Secondary | ICD-10-CM

## 2023-07-27 NOTE — Patient Instructions (Addendum)

## 2023-07-27 NOTE — Progress Notes (Unsigned)
   Follow-Up Visit   Subjective  Joshua Frye is a 88 y.o. male who presents for the following: place at L leg below the knee appeared about a month ago patient reports was not present at last visit.   The patient has spots, moles and lesions to be evaluated, some may be new or changing and the patient may have concern these could be cancer.   The following portions of the chart were reviewed this encounter and updated as appropriate: medications, allergies, medical history  Review of Systems:  No other skin or systemic complaints except as noted in HPI or Assessment and Plan.  Objective  Well appearing patient in no apparent distress; mood and affect are within normal limits.  A focused examination was performed of the following areas: L leg   Relevant exam findings are noted in the Assessment and Plan.  Left anterior leg 1.1 cm pink plaque   Assessment & Plan     SCC (SQUAMOUS CELL CARCINOMA) Left anterior leg Epidermal / dermal shaving  Lesion diameter (cm):  1.1 Informed consent: discussed and consent obtained   Timeout: patient name, date of birth, surgical site, and procedure verified   Procedure prep:  Patient was prepped and draped in usual sterile fashion Prep type:  Povidone-iodine and isopropyl alcohol Anesthesia: the lesion was anesthetized in a standard fashion   Anesthetic:  1% lidocaine  w/ epinephrine  1-100,000 buffered w/ 8.4% NaHCO3 Instrument used: DermaBlade   Hemostasis achieved with: pressure and aluminum chloride   Outcome: patient tolerated procedure well   Post-procedure details: wound care instructions given    Destruction of lesion Complexity: simple   Destruction method: electrodesiccation and curettage   Informed consent: discussed and consent obtained   Timeout:  patient name, date of birth, surgical site, and procedure verified Procedure prep:  Patient was prepped and draped in usual sterile fashion Prep type:  Isopropyl  alcohol Anesthesia: the lesion was anesthetized in a standard fashion   Anesthetic:  1% lidocaine  w/ epinephrine  1-100,000 local infiltration Curettage performed in three different directions: Yes   Electrodesiccation performed over the curetted area: Yes   Curettage cycles:  3 Margin per side (cm):  0.4 Final wound size (cm):  1.9 Hemostasis achieved with:  aluminum chloride and electrodesiccation Outcome: patient tolerated procedure well with no complications   Post-procedure details: sterile dressing applied and wound care instructions given   Dressing type: bandage and petrolatum   Specimen 1 - Surgical pathology Differential Diagnosis: SCC  Check Margins: No 1.1 cm pink plaque Recommend excision but patient prefers EDC today  Patient advised to let us  know if lesion re-grows and will need surgical excision.   ACTINIC DAMAGE - chronic, secondary to cumulative UV radiation exposure/sun exposure over time - diffuse scaly erythematous macules with underlying dyspigmentation - Recommend daily broad spectrum sunscreen SPF 30+ to sun-exposed areas, reapply every 2 hours as needed.  - Recommend staying in the shade or wearing long sleeves, sun glasses (UVA+UVB protection) and wide brim hats (4-inch brim around the entire circumference of the hat). - Call for new or changing lesions.  Return for As scheduled, w/ Dr. Bary Likes, recheck biopsy left lower leg.  I, Jacquelynn V. Grier Leber, CMA, am acting as scribe for Harris Liming, MD .   Documentation: I have reviewed the above documentation for accuracy and completeness, and I agree with the above.  Harris Liming, MD

## 2023-07-30 ENCOUNTER — Ambulatory Visit: Payer: Self-pay | Admitting: Dermatology

## 2023-07-30 LAB — SURGICAL PATHOLOGY

## 2023-08-03 ENCOUNTER — Encounter: Payer: Self-pay | Admitting: Dermatology

## 2023-08-03 NOTE — Telephone Encounter (Signed)
-----   Message from Petty sent at 07/30/2023  6:06 PM EDT ----- Diagnosis: left anterior leg :       WELL DIFFERENTIATED SQUAMOUS CELL CARCINOMA    Plan: please call to share that biopsy shows SCC. It was treated during the appointment. Site must be rechecked at follow up with Dr Linnell Richardson on August 26. Thank you

## 2023-08-03 NOTE — Telephone Encounter (Signed)
 Patient advised pathology SCC, already treated with EDC. Recheck on follow up. Sue Em., RMA

## 2023-09-09 ENCOUNTER — Encounter: Payer: Self-pay | Admitting: Urology

## 2023-09-21 ENCOUNTER — Ambulatory Visit: Admitting: Dermatology

## 2023-10-20 ENCOUNTER — Encounter: Payer: Self-pay | Admitting: Dermatology

## 2023-10-20 ENCOUNTER — Ambulatory Visit: Payer: Medicare Other | Admitting: Dermatology

## 2023-10-20 DIAGNOSIS — L578 Other skin changes due to chronic exposure to nonionizing radiation: Secondary | ICD-10-CM | POA: Diagnosis not present

## 2023-10-20 DIAGNOSIS — Z8589 Personal history of malignant neoplasm of other organs and systems: Secondary | ICD-10-CM

## 2023-10-20 DIAGNOSIS — L820 Inflamed seborrheic keratosis: Secondary | ICD-10-CM

## 2023-10-20 DIAGNOSIS — W908XXA Exposure to other nonionizing radiation, initial encounter: Secondary | ICD-10-CM

## 2023-10-20 DIAGNOSIS — L570 Actinic keratosis: Secondary | ICD-10-CM

## 2023-10-20 NOTE — Patient Instructions (Addendum)
 Actinic keratoses are precancerous spots that appear secondary to cumulative UV radiation exposure/sun exposure over time. They are chronic with expected duration over 1 year. A portion of actinic keratoses will progress to squamous cell carcinoma of the skin. It is not possible to reliably predict which spots will progress to skin cancer and so treatment is recommended to prevent development of skin cancer.  Recommend daily broad spectrum sunscreen SPF 30+ to sun-exposed areas, reapply every 2 hours as needed.  Recommend staying in the shade or wearing long sleeves, sun glasses (UVA+UVB protection) and wide brim hats (4-inch brim around the entire circumference of the hat). Call for new or changing lesions.   Cryotherapy Aftercare  Wash gently with soap and water everyday.   Apply Vaseline and Band-Aid daily until healed.    Seborrheic Keratosis  What causes seborrheic keratoses? Seborrheic keratoses are harmless, common skin growths that first appear during adult life.  As time goes by, more growths appear.  Some people may develop a large number of them.  Seborrheic keratoses appear on both covered and uncovered body parts.  They are not caused by sunlight.  The tendency to develop seborrheic keratoses can be inherited.  They vary in color from skin-colored to gray, brown, or even black.  They can be either smooth or have a rough, warty surface.   Seborrheic keratoses are superficial and look as if they were stuck on the skin.  Under the microscope this type of keratosis looks like layers upon layers of skin.  That is why at times the top layer may seem to fall off, but the rest of the growth remains and re-grows.    Treatment Seborrheic keratoses do not need to be treated, but can easily be removed in the office.  Seborrheic keratoses often cause symptoms when they rub on clothing or jewelry.  Lesions can be in the way of shaving.  If they become inflamed, they can cause itching, soreness,  or burning.  Removal of a seborrheic keratosis can be accomplished by freezing, burning, or surgery. If any spot bleeds, scabs, or grows rapidly, please return to have it checked, as these can be an indication of a skin cancer.  Due to recent changes in healthcare laws, you may see results of your pathology and/or laboratory studies on MyChart before the doctors have had a chance to review them. We understand that in some cases there may be results that are confusing or concerning to you. Please understand that not all results are received at the same time and often the doctors may need to interpret multiple results in order to provide you with the best plan of care or course of treatment. Therefore, we ask that you please give us  2 business days to thoroughly review all your results before contacting the office for clarification. Should we see a critical lab result, you will be contacted sooner.   If You Need Anything After Your Visit  If you have any questions or concerns for your doctor, please call our main line at [phone] and press option 4 to reach your doctor's medical assistant. If no one answers, please leave a voicemail as directed and we will return your call as soon as possible. Messages left after 4 pm will be answered the following business day.   You may also send us  a message via MyChart. We typically respond to MyChart messages within 1-2 business days.  For prescription refills, please ask your pharmacy to contact our office. Our fax number  is [phone].  If you have an urgent issue when the clinic is closed that cannot wait until the next business day, you can page your doctor at the number below.    Please note that while we do our best to be available for urgent issues outside of office hours, we are not available 24/7.   If you have an urgent issue and are unable to reach us , you may choose to seek medical care at your doctor's office, retail clinic, urgent care  center, or emergency room.  If you have a medical emergency, please immediately call 911 or go to the emergency department.  Pager Numbers  - Dr. Hester: [phone]  - Dr. Jackquline: [phone]  - Dr. Claudene: [phone]   - Dr. Raymund: [phone]  In the event of inclement weather, please call our main line at [phone] for an update on the status of any delays or closures.  Dermatology Medication Tips: Please keep the boxes that topical medications come in in order to help keep track of the instructions about where and how to use these. Pharmacies typically print the medication instructions only on the boxes and not directly on the medication tubes.   If your medication is too expensive, please contact our office at [phone] option 4 or send us  a message through MyChart.   We are unable to tell what your co-pay for medications will be in advance as this is different depending on your insurance coverage. However, we may be able to find a substitute medication at lower cost or fill out paperwork to get insurance to cover a needed medication.   If a prior authorization is required to get your medication covered by your insurance company, please allow us  1-2 business days to complete this process.  Drug prices often vary depending on where the prescription is filled and some pharmacies may offer cheaper prices.  The website www.goodrx.com contains coupons for medications through different pharmacies. The prices here do not account for what the cost may be with help from insurance (it may be cheaper with your insurance), but the website can give you the price if you did not use any insurance.  - You can print the associated coupon and take it with your prescription to the pharmacy.  - You may also stop by our office during regular business hours and pick up a GoodRx coupon card.  - If you need your prescription sent electronically to a different pharmacy, notify our office  through [facility] or by phone at [phone] option 4.     Si Usted Necesita Algo Despus de Su Visita  Tambin puede enviarnos un mensaje a travs de Clinical cytogeneticist. Por lo general respondemos a los mensajes de MyChart en el transcurso de 1 a 2 das hbiles.  Para renovar recetas, por favor pida a su farmacia que se ponga en contacto con nuestra oficina. Randi lakes de fax es [place] [phone].  Si tiene un asunto urgente cuando la clnica est cerrada y que no puede esperar hasta el siguiente da hbil, puede llamar/localizar a su doctor(a) al nmero que aparece a continuacin.   Por favor, tenga en cuenta que aunque hacemos todo lo posible para estar disponibles para asuntos urgentes fuera del horario de [place], no estamos disponibles las 24 horas del da, los 7 [address]  [address] de la [place].   Si tiene un problema urgente y no puede comunicarse con nosotros, puede optar por buscar atencin mdica  en el consultorio de su doctor(a), en una clnica  privada, en un centro de atencin urgente o en una sala de emergencias.  Si tiene Engineer, drilling, por favor llame inmediatamente al 911 o vaya a la sala de emergencias.  Nmeros de bper  - Dr. Hester: [phone]  - Dra. Jackquline: [phone]  - Dr. Claudene: [phone]  - Dra. Kitts: [phone]  En caso de inclemencias del [place], por favor llame a nuestra lnea principal al [phone] para una actualizacin sobre el estado de cualquier retraso o cierre.  Consejos para la medicacin en dermatologa: Por favor, guarde las cajas en las que vienen los medicamentos de uso tpico para ayudarle a seguir las instrucciones sobre dnde y cmo usarlos. Las farmacias generalmente imprimen las instrucciones del medicamento slo en las cajas y no directamente en los tubos del [place].   Si su medicamento es muy caro, por favor, pngase en contacto con landry rieger llamando al [phone] y presione la opcin 4 o envenos un mensaje  a travs de Clinical cytogeneticist.   No podemos decirle cul ser su copago por los medicamentos por adelantado ya que esto es diferente dependiendo de la cobertura de su seguro. Sin embargo, es posible que podamos encontrar un medicamento sustituto a Audiological scientist un formulario para que el seguro cubra el medicamento que se considera necesario.   Si se requiere una autorizacin previa para que su compaa de seguros malta su medicamento, por favor permtanos de 1 a 2 das hbiles para completar este proceso.  Los precios de los medicamentos varan con frecuencia dependiendo del Environmental consultant de dnde se surte la receta y alguna farmacias pueden ofrecer precios ms baratos.  El sitio web www.goodrx.com tiene cupones para medicamentos de Health and safety inspector. Los precios aqu no tienen en cuenta lo que podra costar con la ayuda del seguro (puede ser ms barato con su seguro), pero el sitio web puede darle el precio si no utiliz Tourist information centre manager.  - Puede imprimir el cupn correspondiente y llevarlo con su receta a la farmacia.  - Tambin puede pasar por nuestra oficina durante el horario de atencin regular y Education officer, museum una tarjeta de cupones de GoodRx.  - Si necesita que su receta se enve electrnicamente a una farmacia diferente, informe a nuestra oficina a travs de MyChart de [place] o por telfono llamando al [phone] y presione la opcin 4.

## 2023-10-20 NOTE — Progress Notes (Signed)
 Follow-Up Visit   Subjective  Joshua Frye is a 88 y.o. male who presents for the following:  Ak follow up Hx of isk, hx of aks  The following portions of the chart were reviewed this encounter and updated as appropriate: medications, allergies, medical history  Review of Systems:  No other skin or systemic complaints except as noted in HPI or Assessment and Plan.  Objective  Well appearing patient in no apparent distress; mood and affect are within normal limits.  A focused examination was performed of the following areas: Legs, arms, hands, face, neck  Relevant exam findings are noted in the Assessment and Plan.  left arm x 3, below left knee x 1, right forearm x 1, left popliteal x 2, below right lateral knee x 3 (10) Erythematous stuck-on, waxy papule or plaque right cheek x 1, left temple x 2 (3) Erythematous thin papules/macules with gritty scale.   Assessment & Plan   ACTINIC DAMAGE - chronic, secondary to cumulative UV radiation exposure/sun exposure over time - diffuse scaly erythematous macules with underlying dyspigmentation - Recommend daily broad spectrum sunscreen SPF 30+ to sun-exposed areas, reapply every 2 hours as needed.  - Recommend staying in the shade or wearing long sleeves, sun glasses (UVA+UVB protection) and wide brim hats (4-inch brim around the entire circumference of the hat). - Call for new or changing lesions.  HISTORY OF SQUAMOUS CELL CARCINOMA OF THE SKIN 07/27/2023 - left anterior leg  - No evidence of recurrence today - No lymphadenopathy - Recommend regular full body skin exams - Recommend daily broad spectrum sunscreen SPF 30+ to sun-exposed areas, reapply every 2 hours as needed.  - Call if any new or changing lesions are noted between office visits  INFLAMED SEBORRHEIC KERATOSIS (10) left arm x 3, below left knee x 1, right forearm x 1, left popliteal x 2, below right lateral knee x 3 (10) Symptomatic, irritating, patient would like  treated. Destruction of lesion - left arm x 3, below left knee x 1, right forearm x 1, left popliteal x 2, below right lateral knee x 3 (10) Complexity: simple   Destruction method: cryotherapy   Informed consent: discussed and consent obtained   Timeout:  patient name, date of birth, surgical site, and procedure verified Lesion destroyed using liquid nitrogen: Yes   Region frozen until ice ball extended beyond lesion: Yes   Outcome: patient tolerated procedure well with no complications   Post-procedure details: wound care instructions given    ACTINIC KERATOSIS (3) right cheek x 1, left temple x 2 (3) Actinic keratoses are precancerous spots that appear secondary to cumulative UV radiation exposure/sun exposure over time. They are chronic with expected duration over 1 year. A portion of actinic keratoses will progress to squamous cell carcinoma of the skin. It is not possible to reliably predict which spots will progress to skin cancer and so treatment is recommended to prevent development of skin cancer.  Recommend daily broad spectrum sunscreen SPF 30+ to sun-exposed areas, reapply every 2 hours as needed.  Recommend staying in the shade or wearing long sleeves, sun glasses (UVA+UVB protection) and wide brim hats (4-inch brim around the entire circumference of the hat). Call for new or changing lesions. Destruction of lesion - right cheek x 1, left temple x 2 (3)  Return in about 6 months (around 04/21/2024) for TBSE hx of scc, hx of aks .  IEleanor Blush, CMA, am acting as scribe for Alm Rhyme, MD.  Documentation: I have reviewed the above documentation for accuracy and completeness, and I agree with the above.  Alm Rhyme, MD

## 2023-11-18 ENCOUNTER — Other Ambulatory Visit: Payer: Self-pay | Admitting: Urology

## 2023-11-18 DIAGNOSIS — R351 Nocturia: Secondary | ICD-10-CM

## 2023-11-18 DIAGNOSIS — Z87898 Personal history of other specified conditions: Secondary | ICD-10-CM

## 2023-11-19 ENCOUNTER — Ambulatory Visit: Payer: Self-pay | Admitting: Urology

## 2023-11-20 ENCOUNTER — Ambulatory Visit (INDEPENDENT_AMBULATORY_CARE_PROVIDER_SITE_OTHER): Admitting: Urology

## 2023-11-20 VITALS — BP 165/104 | HR 82 | Ht 68.0 in | Wt 170.0 lb

## 2023-11-20 DIAGNOSIS — N401 Enlarged prostate with lower urinary tract symptoms: Secondary | ICD-10-CM | POA: Diagnosis not present

## 2023-11-20 DIAGNOSIS — Z87898 Personal history of other specified conditions: Secondary | ICD-10-CM

## 2023-11-20 LAB — BLADDER SCAN AMB NON-IMAGING: Scan Result: 60

## 2023-11-20 NOTE — Progress Notes (Signed)
 11/20/2023 10:22 AM   Joshua Frye 1936/02/06 [id number]  Referring provider: Cleotilde Oneil FALCON, MD [phone] [facility] [address] [facility] [place],  [place] [address]  Chief Complaint  Patient presents with   Nocturia   Urinary Frequency   Urologic history:   1.  BPH with lower urinary tract symptoms Postop urinary retention after herniorrhaphy 2017 Tamsulosin  0.8 mg PVP 2006   2. Gross hematuria CT abdomen/pelvis with contrast June 2023, showed no upper tract abnormalities. Cystoscopy 11/29/21 with adenoma regrowth left lateral prostate extending across midline and with prominent hypervascularity.   HPI: Joshua Frye is a 88 y.o. male presents for annual follow-up  No problems since last year's visit Remains on tamsulosin  with stable lower urinary tract symptoms nocturia x 2-3 No gross hematuria No flank, abdominal or pelvic pain  PMH: Past Medical History:  Diagnosis Date   Actinic keratosis    Anxiety    Asthma    BPH (benign prostatic hypertrophy) 2017   Bronchiectasis (HCC)    COPD (chronic obstructive pulmonary disease) (HCC)    Coronary artery disease    Dysrhythmia    asymptomatic pvcs   GERD (gastroesophageal reflux disease)    History of gout    several years ago   HOH (hard of hearing)    Bilateral Hearing Aids   Hyperlipidemia    Hypertension    Hypothyroidism    Myocardial infarction (HCC) 1985   Presence of permanent cardiac pacemaker    SCC (squamous cell carcinoma) 06/24/2023   right anterior lateral ankle - ED&C done -   SCC (squamous cell carcinoma) 06/24/2023   left lateral inferior popliteal ED&C done   SCC (squamous cell carcinoma) 07/27/2023   left anterior leg, treated with EDC   Sleep apnea    OSA--C-PAP   Squamous cell carcinoma of arm, 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   L forearm    Squamous cell carcinoma of arm, left 09/10/2017   L mid dorsum forearm    Squamous cell carcinoma of arm, right 06/01/2014   R prox forearm     Squamous cell carcinoma of hand, 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 WD SCC with superficial infiltration. EDC.   Squamous cell carcinoma of skin 01/09/2020   R lower leg, EDC   Squamous cell carcinoma of skin 07/09/2020   R ear mid helix groove - ED&C    Squamous cell carcinoma of skin 06/24/2021   right chest, excised 09/24/2021   Squamous cell carcinoma of skin 10/01/2021   left lateral antecubital fossa - needs ED&C   Squamous cell carcinoma of skin 11/27/2021   L ant thigh, EDC   Squamous cell carcinoma of skin 12/31/2021   R forearm - ED&C   Squamous cell carcinoma of skin 12/31/2021   L lat thigh - ED&C   Squamous cell carcinoma of skin 12/31/2021   L inf lat knee - ED&C   Squamous cell carcinoma of skin 06/03/2022   Low  mid chest. WD SCC. EDC   Squamous cell carcinoma of skin 10/02/2022   R med thigh, ED&C   Squamous cell carcinoma, arm, right 04/27/2013   R prox dorsum forearm     Surgical History: Past Surgical History:  Procedure Laterality Date   APPENDECTOMY     COLONOSCOPY WITH PROPOFOL  N/A 05/05/2017   Procedure: COLONOSCOPY WITH PROPOFOL ;  Surgeon: Viktoria Lamar DASEN, MD;  Location: [facility] ENDOSCOPY;  Service: Endoscopy;  Laterality: N/A;   CORONARY ANGIOPLASTY     x 2   CORONARY ARTERY BYPASS GRAFT  1985   INGUINAL HERNIA REPAIR Right 11/20/2015   Procedure: HERNIA REPAIR INGUINAL ADULT;  Surgeon: Larinda Unknown Sharps, MD;  Location: [facility] OR[facility];  Service: General;  Laterality: Right;   INSERT / REPLACE / REMOVE PACEMAKER     PACEMAKER INSERTION  Left 10/15/2016   Procedure: INSERTION PACEMAKER;  Surgeon: Ammon Blunt, MD;  Location: [facility];  Service: Cardiovascular;  Laterality: Left;   PACEMAKER LEAD REMOVAL N/A 11/19/2016   Procedure: LEAD REVISION;  Surgeon: Ammon Blunt, MD;  Location: [facility];  Service: Cardiovascular;  Laterality: N/A;   TEE WITHOUT CARDIOVERSION N/A 07/14/2019   Procedure: TRANSESOPHAGEAL ECHOCARDIOGRAM (TEE);  Surgeon: Bosie Vinie LABOR, MD;  Location: [facility];  Service: Cardiovascular;  Laterality: N/A;    Home Medications:  Allergies as of 11/20/2023       Reactions   Procardia [nifedipine] Other (See Comments)   Other reaction(s): Unknown dizzy Gets very woozy with this medication        Medication List        Accurate as of November 20, 2023 10:22 AM. If you have any questions, ask your nurse or doctor.          amLODipine  10 MG tablet Commonly known as: NORVASC  Take 10 mg by mouth daily.   apixaban  5 MG Tabs tablet Commonly known as: ELIQUIS  Take 5 mg by mouth 2 (two) times daily.   ascorbic acid 500 MG tablet Commonly known as: VITAMIN C Take 500 mg by mouth daily.   atorvastatin  20 MG tablet Commonly known as: LIPITOR TAKE ONE (1) TABLET BY MOUTH EVERY DAY   budesonide  180 MCG/ACT inhaler Commonly known as: PULMICORT  Inhale 1 puff into the lungs daily.   fluticasone  50 MCG/ACT nasal spray Commonly known as: FLONASE  instill 2 sprays into each nostril once daily   ibuprofen 200 MG tablet Commonly known as: ADVIL Take 400 mg by mouth every 8 (eight) hours as needed for headache or mild pain.   isosorbide  mononitrate 30 MG 24 hr tablet Commonly known as: IMDUR  Take 30 mg by mouth daily.   ketoconazole  2 % cream Commonly known as: NIZORAL  Apply to the feet and between toes QHS   ketoconazole  2 % cream Commonly known as: NIZORAL  Apply twice daily to feet until clear   montelukast  10 MG tablet Commonly known as: SINGULAIR  Take 10 mg by mouth  daily.   multivitamin capsule Take 1 capsule by mouth daily.   mupirocin  ointment 2 % Commonly known as: BACTROBAN  Apply 1 Application topically daily. Qd to excision site   Myrbetriq  50 MG Tb24 tablet Generic drug: mirabegron  ER TAKE 1 TABLET BY MOUTH EVERY DAY   nitroGLYCERIN  0.4 MG SL tablet Commonly known as: NITROSTAT  Place 0.4 mg under the tongue every 5 (five) minutes as needed for chest pain.   NON FORMULARY   omeprazole 20 MG capsule Commonly known as: PRILOSEC TAKE ONE (1) CAPSULE EACH DAY   tamsulosin  0.4  MG Caps capsule Commonly known as: FLOMAX  TAKE 1 CAPSULE(0.4 MG) BY MOUTH TWICE DAILY   terbinafine  250 MG tablet Commonly known as: LAMISIL  Take 1 tablet (250 mg total) by mouth daily.        Allergies:  Allergies  Allergen Reactions   Procardia [Nifedipine] Other (See Comments)    Other reaction(s): Unknown dizzy Gets very woozy with this medication    Family History: Family History  Problem Relation Age of Onset   Diabetes Mother     Social History:  reports that he has never smoked. He has never used smokeless tobacco. He reports that he does not drink alcohol and does not use drugs.   Physical Exam: BP (!) 165/104   Pulse 82   Ht 5' 8 (1.727 m)   Wt 170 lb (77.1 kg)   BMI 25.85 kg/m   Constitutional:  Alert, No acute distress. HEENT: [place] AT Respiratory: Normal respiratory effort, no increased work of breathing. Psychiatric: Normal mood and affect.    Assessment & Plan:    1. Benign prostatic hyperplasia with LUTS Stable on tamsulosin  PVR today 60 mL Continue annual follow-up   Glendia JAYSON Barba, MD  [facility] [address], [address] [place], [place] [address] [phone]

## 2023-11-22 ENCOUNTER — Encounter: Payer: Self-pay | Admitting: Urology

## 2024-04-19 ENCOUNTER — Ambulatory Visit: Admitting: Dermatology

## 2024-11-18 ENCOUNTER — Ambulatory Visit: Admitting: Urology
